# Patient Record
Sex: Female | Born: 1975 | Race: White | Hispanic: No | Smoking: Current every day smoker
Health system: Southern US, Community
[De-identification: ages and names within clinical notes are randomized; demographics above are authoritative.]

## PROBLEM LIST (undated history)

## (undated) DIAGNOSIS — R52 Pain, unspecified: Secondary | ICD-10-CM

## (undated) DIAGNOSIS — L049 Acute lymphadenitis, unspecified: Secondary | ICD-10-CM

## (undated) DIAGNOSIS — M549 Dorsalgia, unspecified: Secondary | ICD-10-CM

## (undated) DIAGNOSIS — F32A Depression, unspecified: Secondary | ICD-10-CM

## (undated) DIAGNOSIS — S129XXA Fracture of neck, unspecified, initial encounter: Secondary | ICD-10-CM

## (undated) DIAGNOSIS — N200 Calculus of kidney: Secondary | ICD-10-CM

## (undated) DIAGNOSIS — R569 Unspecified convulsions: Secondary | ICD-10-CM

## (undated) DIAGNOSIS — M51369 Other intervertebral disc degeneration, lumbar region without mention of lumbar back pain or lower extremity pain: Secondary | ICD-10-CM

## (undated) DIAGNOSIS — R112 Nausea with vomiting, unspecified: Secondary | ICD-10-CM

## (undated) DIAGNOSIS — F419 Anxiety disorder, unspecified: Secondary | ICD-10-CM

## (undated) DIAGNOSIS — M47816 Spondylosis without myelopathy or radiculopathy, lumbar region: Secondary | ICD-10-CM

## (undated) DIAGNOSIS — Z9889 Other specified postprocedural states: Secondary | ICD-10-CM

## (undated) DIAGNOSIS — G8929 Other chronic pain: Secondary | ICD-10-CM

## (undated) DIAGNOSIS — F329 Major depressive disorder, single episode, unspecified: Secondary | ICD-10-CM

## (undated) DIAGNOSIS — M5136 Other intervertebral disc degeneration, lumbar region: Secondary | ICD-10-CM

## (undated) DIAGNOSIS — N83209 Unspecified ovarian cyst, unspecified side: Secondary | ICD-10-CM

## (undated) HISTORY — DX: Unspecified ovarian cyst, unspecified side: N83.209

## (undated) HISTORY — DX: Depression, unspecified: F32.A

## (undated) HISTORY — DX: Acute lymphadenitis, unspecified: L04.9

## (undated) HISTORY — PX: COLPOSCOPY: SHX161

## (undated) HISTORY — PX: TUBAL LIGATION: SHX77

## (undated) HISTORY — PX: ABDOMINAL HYSTERECTOMY: SHX81

## (undated) HISTORY — DX: Spondylosis without myelopathy or radiculopathy, lumbar region: M47.816

## (undated) HISTORY — DX: Major depressive disorder, single episode, unspecified: F32.9

## (undated) HISTORY — PX: BACK SURGERY: SHX140

## (undated) HISTORY — DX: Anxiety disorder, unspecified: F41.9

## (undated) HISTORY — PX: TONSILLECTOMY: SUR1361

---

## 2001-05-24 ENCOUNTER — Emergency Department (HOSPITAL_COMMUNITY): Admission: EM | Admit: 2001-05-24 | Discharge: 2001-05-24 | Payer: Self-pay | Admitting: *Deleted

## 2001-11-08 ENCOUNTER — Encounter: Payer: Self-pay | Admitting: Internal Medicine

## 2001-11-08 ENCOUNTER — Ambulatory Visit (HOSPITAL_COMMUNITY): Admission: RE | Admit: 2001-11-08 | Discharge: 2001-11-08 | Payer: Self-pay | Admitting: Internal Medicine

## 2002-02-23 ENCOUNTER — Emergency Department (HOSPITAL_COMMUNITY): Admission: EM | Admit: 2002-02-23 | Discharge: 2002-02-23 | Payer: Self-pay | Admitting: Emergency Medicine

## 2002-07-10 ENCOUNTER — Encounter: Payer: Self-pay | Admitting: Internal Medicine

## 2002-07-10 ENCOUNTER — Emergency Department (HOSPITAL_COMMUNITY): Admission: EM | Admit: 2002-07-10 | Discharge: 2002-07-10 | Payer: Self-pay | Admitting: Internal Medicine

## 2002-07-13 ENCOUNTER — Ambulatory Visit (HOSPITAL_COMMUNITY): Admission: RE | Admit: 2002-07-13 | Discharge: 2002-07-13 | Payer: Self-pay | Admitting: General Surgery

## 2002-07-27 ENCOUNTER — Emergency Department (HOSPITAL_COMMUNITY): Admission: EM | Admit: 2002-07-27 | Discharge: 2002-07-27 | Payer: Self-pay | Admitting: Emergency Medicine

## 2002-08-12 ENCOUNTER — Emergency Department (HOSPITAL_COMMUNITY): Admission: EM | Admit: 2002-08-12 | Discharge: 2002-08-12 | Payer: Self-pay | Admitting: Internal Medicine

## 2002-09-29 ENCOUNTER — Encounter: Payer: Self-pay | Admitting: Emergency Medicine

## 2002-09-29 ENCOUNTER — Emergency Department (HOSPITAL_COMMUNITY): Admission: EM | Admit: 2002-09-29 | Discharge: 2002-09-29 | Payer: Self-pay | Admitting: Emergency Medicine

## 2002-10-29 ENCOUNTER — Emergency Department (HOSPITAL_COMMUNITY): Admission: EM | Admit: 2002-10-29 | Discharge: 2002-10-29 | Payer: Self-pay | Admitting: Psychology

## 2002-10-31 ENCOUNTER — Encounter: Payer: Self-pay | Admitting: Emergency Medicine

## 2002-10-31 ENCOUNTER — Emergency Department (HOSPITAL_COMMUNITY): Admission: EM | Admit: 2002-10-31 | Discharge: 2002-10-31 | Payer: Self-pay | Admitting: Emergency Medicine

## 2002-11-06 ENCOUNTER — Emergency Department (HOSPITAL_COMMUNITY): Admission: EM | Admit: 2002-11-06 | Discharge: 2002-11-06 | Payer: Self-pay | Admitting: Emergency Medicine

## 2002-11-26 ENCOUNTER — Emergency Department (HOSPITAL_COMMUNITY): Admission: EM | Admit: 2002-11-26 | Discharge: 2002-11-26 | Payer: Self-pay | Admitting: Emergency Medicine

## 2002-12-15 ENCOUNTER — Emergency Department (HOSPITAL_COMMUNITY): Admission: EM | Admit: 2002-12-15 | Discharge: 2002-12-15 | Payer: Self-pay | Admitting: Internal Medicine

## 2003-05-30 ENCOUNTER — Emergency Department (HOSPITAL_COMMUNITY): Admission: EM | Admit: 2003-05-30 | Discharge: 2003-05-31 | Payer: Self-pay | Admitting: Emergency Medicine

## 2003-05-31 ENCOUNTER — Encounter: Payer: Self-pay | Admitting: Emergency Medicine

## 2003-08-11 ENCOUNTER — Emergency Department (HOSPITAL_COMMUNITY): Admission: EM | Admit: 2003-08-11 | Discharge: 2003-08-11 | Payer: Self-pay | Admitting: Emergency Medicine

## 2004-03-04 ENCOUNTER — Ambulatory Visit (HOSPITAL_COMMUNITY): Admission: RE | Admit: 2004-03-04 | Discharge: 2004-03-04 | Payer: Self-pay | Admitting: General Surgery

## 2004-06-03 ENCOUNTER — Emergency Department (HOSPITAL_COMMUNITY): Admission: EM | Admit: 2004-06-03 | Discharge: 2004-06-03 | Payer: Self-pay | Admitting: Emergency Medicine

## 2004-12-25 ENCOUNTER — Emergency Department (HOSPITAL_COMMUNITY): Admission: EM | Admit: 2004-12-25 | Discharge: 2004-12-25 | Payer: Self-pay | Admitting: *Deleted

## 2006-06-23 ENCOUNTER — Emergency Department (HOSPITAL_COMMUNITY): Admission: EM | Admit: 2006-06-23 | Discharge: 2006-06-23 | Payer: Self-pay | Admitting: Emergency Medicine

## 2006-06-25 ENCOUNTER — Emergency Department (HOSPITAL_COMMUNITY): Admission: EM | Admit: 2006-06-25 | Discharge: 2006-06-25 | Payer: Self-pay | Admitting: Emergency Medicine

## 2006-09-28 ENCOUNTER — Inpatient Hospital Stay (HOSPITAL_COMMUNITY): Admission: AD | Admit: 2006-09-28 | Discharge: 2006-09-30 | Payer: Self-pay | Admitting: Obstetrics and Gynecology

## 2006-10-26 ENCOUNTER — Ambulatory Visit: Payer: Self-pay | Admitting: Family Medicine

## 2006-10-26 DIAGNOSIS — E669 Obesity, unspecified: Secondary | ICD-10-CM | POA: Insufficient documentation

## 2006-10-26 DIAGNOSIS — M545 Low back pain: Secondary | ICD-10-CM

## 2006-10-26 DIAGNOSIS — G43009 Migraine without aura, not intractable, without status migrainosus: Secondary | ICD-10-CM | POA: Insufficient documentation

## 2006-11-03 ENCOUNTER — Telehealth (INDEPENDENT_AMBULATORY_CARE_PROVIDER_SITE_OTHER): Payer: Self-pay | Admitting: Family Medicine

## 2006-11-09 ENCOUNTER — Encounter (INDEPENDENT_AMBULATORY_CARE_PROVIDER_SITE_OTHER): Payer: Self-pay | Admitting: Family Medicine

## 2006-11-10 ENCOUNTER — Ambulatory Visit (HOSPITAL_COMMUNITY): Admission: RE | Admit: 2006-11-10 | Discharge: 2006-11-10 | Payer: Self-pay | Admitting: Family Medicine

## 2006-11-17 ENCOUNTER — Encounter (INDEPENDENT_AMBULATORY_CARE_PROVIDER_SITE_OTHER): Payer: Self-pay | Admitting: Family Medicine

## 2006-11-23 ENCOUNTER — Ambulatory Visit: Payer: Self-pay | Admitting: Family Medicine

## 2006-11-25 DIAGNOSIS — M5137 Other intervertebral disc degeneration, lumbosacral region: Secondary | ICD-10-CM

## 2006-12-14 ENCOUNTER — Telehealth (INDEPENDENT_AMBULATORY_CARE_PROVIDER_SITE_OTHER): Payer: Self-pay | Admitting: Family Medicine

## 2006-12-14 ENCOUNTER — Emergency Department (HOSPITAL_COMMUNITY): Admission: EM | Admit: 2006-12-14 | Discharge: 2006-12-14 | Payer: Self-pay | Admitting: Emergency Medicine

## 2006-12-14 DIAGNOSIS — M542 Cervicalgia: Secondary | ICD-10-CM

## 2006-12-14 DIAGNOSIS — IMO0002 Reserved for concepts with insufficient information to code with codable children: Secondary | ICD-10-CM | POA: Insufficient documentation

## 2006-12-24 ENCOUNTER — Telehealth (INDEPENDENT_AMBULATORY_CARE_PROVIDER_SITE_OTHER): Payer: Self-pay | Admitting: *Deleted

## 2007-07-30 ENCOUNTER — Emergency Department (HOSPITAL_COMMUNITY): Admission: EM | Admit: 2007-07-30 | Discharge: 2007-07-30 | Payer: Self-pay | Admitting: Emergency Medicine

## 2007-08-13 ENCOUNTER — Ambulatory Visit (HOSPITAL_COMMUNITY): Admission: RE | Admit: 2007-08-13 | Discharge: 2007-08-13 | Payer: Self-pay | Admitting: Neurosurgery

## 2007-08-15 ENCOUNTER — Emergency Department (HOSPITAL_COMMUNITY): Admission: EM | Admit: 2007-08-15 | Discharge: 2007-08-15 | Payer: Self-pay | Admitting: Emergency Medicine

## 2007-09-24 ENCOUNTER — Encounter: Payer: Self-pay | Admitting: Internal Medicine

## 2007-09-26 ENCOUNTER — Emergency Department (HOSPITAL_COMMUNITY): Admission: EM | Admit: 2007-09-26 | Discharge: 2007-09-26 | Payer: Self-pay | Admitting: Emergency Medicine

## 2007-10-10 ENCOUNTER — Emergency Department (HOSPITAL_COMMUNITY): Admission: EM | Admit: 2007-10-10 | Discharge: 2007-10-10 | Payer: Self-pay | Admitting: Emergency Medicine

## 2007-10-17 ENCOUNTER — Emergency Department (HOSPITAL_COMMUNITY): Admission: EM | Admit: 2007-10-17 | Discharge: 2007-10-18 | Payer: Self-pay | Admitting: Emergency Medicine

## 2007-12-20 ENCOUNTER — Emergency Department (HOSPITAL_COMMUNITY): Admission: EM | Admit: 2007-12-20 | Discharge: 2007-12-20 | Payer: Self-pay | Admitting: Emergency Medicine

## 2007-12-31 ENCOUNTER — Emergency Department (HOSPITAL_COMMUNITY): Admission: EM | Admit: 2007-12-31 | Discharge: 2007-12-31 | Payer: Self-pay | Admitting: Emergency Medicine

## 2008-01-21 ENCOUNTER — Emergency Department (HOSPITAL_COMMUNITY): Admission: EM | Admit: 2008-01-21 | Discharge: 2008-01-21 | Payer: Self-pay | Admitting: Emergency Medicine

## 2008-02-20 ENCOUNTER — Emergency Department (HOSPITAL_COMMUNITY): Admission: EM | Admit: 2008-02-20 | Discharge: 2008-02-20 | Payer: Self-pay | Admitting: Emergency Medicine

## 2008-03-07 ENCOUNTER — Emergency Department (HOSPITAL_COMMUNITY): Admission: EM | Admit: 2008-03-07 | Discharge: 2008-03-07 | Payer: Self-pay | Admitting: Emergency Medicine

## 2008-04-02 ENCOUNTER — Emergency Department (HOSPITAL_COMMUNITY): Admission: EM | Admit: 2008-04-02 | Discharge: 2008-04-02 | Payer: Self-pay | Admitting: Emergency Medicine

## 2008-05-01 ENCOUNTER — Emergency Department (HOSPITAL_COMMUNITY): Admission: EM | Admit: 2008-05-01 | Discharge: 2008-05-01 | Payer: Self-pay | Admitting: Emergency Medicine

## 2008-05-20 ENCOUNTER — Emergency Department (HOSPITAL_COMMUNITY): Admission: EM | Admit: 2008-05-20 | Discharge: 2008-05-20 | Payer: Self-pay | Admitting: Emergency Medicine

## 2009-05-25 ENCOUNTER — Emergency Department (HOSPITAL_COMMUNITY): Admission: EM | Admit: 2009-05-25 | Discharge: 2009-05-25 | Payer: Self-pay | Admitting: Emergency Medicine

## 2009-08-06 ENCOUNTER — Emergency Department (HOSPITAL_COMMUNITY): Admission: EM | Admit: 2009-08-06 | Discharge: 2009-08-06 | Payer: Self-pay | Admitting: Emergency Medicine

## 2009-10-26 ENCOUNTER — Emergency Department (HOSPITAL_COMMUNITY): Admission: EM | Admit: 2009-10-26 | Discharge: 2009-10-26 | Payer: Self-pay | Admitting: Emergency Medicine

## 2010-03-10 ENCOUNTER — Emergency Department (HOSPITAL_COMMUNITY): Admission: EM | Admit: 2010-03-10 | Discharge: 2010-03-10 | Payer: Self-pay | Admitting: Emergency Medicine

## 2010-06-06 ENCOUNTER — Emergency Department (HOSPITAL_COMMUNITY): Admission: EM | Admit: 2010-06-06 | Discharge: 2010-06-06 | Payer: Self-pay | Admitting: Emergency Medicine

## 2010-06-26 ENCOUNTER — Emergency Department (HOSPITAL_COMMUNITY): Admission: EM | Admit: 2010-06-26 | Discharge: 2010-06-26 | Payer: Self-pay | Admitting: Emergency Medicine

## 2010-08-13 ENCOUNTER — Emergency Department (HOSPITAL_COMMUNITY)
Admission: EM | Admit: 2010-08-13 | Discharge: 2010-08-13 | Payer: Self-pay | Source: Home / Self Care | Admitting: Emergency Medicine

## 2010-08-27 ENCOUNTER — Emergency Department (HOSPITAL_COMMUNITY)
Admission: EM | Admit: 2010-08-27 | Discharge: 2010-08-27 | Payer: Self-pay | Source: Home / Self Care | Admitting: Emergency Medicine

## 2010-09-29 ENCOUNTER — Encounter: Payer: Self-pay | Admitting: Family Medicine

## 2010-10-11 ENCOUNTER — Emergency Department (HOSPITAL_COMMUNITY): Admit: 2010-10-11 | Discharge: 2010-10-11 | Disposition: A | Discharge status: Home / Self Care | Payer: Medicaid Other

## 2010-10-11 ENCOUNTER — Emergency Department (HOSPITAL_COMMUNITY)
Admission: EM | Admit: 2010-10-11 | Discharge: 2010-10-12 | Disposition: A | Discharge status: Home / Self Care | Payer: Medicaid Other | Attending: Emergency Medicine | Admitting: Emergency Medicine

## 2010-10-11 DIAGNOSIS — G8929 Other chronic pain: Secondary | ICD-10-CM | POA: Insufficient documentation

## 2010-10-11 DIAGNOSIS — F329 Major depressive disorder, single episode, unspecified: Secondary | ICD-10-CM | POA: Insufficient documentation

## 2010-10-11 DIAGNOSIS — F29 Unspecified psychosis not due to a substance or known physiological condition: Secondary | ICD-10-CM | POA: Insufficient documentation

## 2010-10-11 DIAGNOSIS — F3289 Other specified depressive episodes: Secondary | ICD-10-CM | POA: Insufficient documentation

## 2010-10-11 DIAGNOSIS — IMO0001 Reserved for inherently not codable concepts without codable children: Secondary | ICD-10-CM | POA: Insufficient documentation

## 2010-10-11 DIAGNOSIS — M549 Dorsalgia, unspecified: Secondary | ICD-10-CM | POA: Insufficient documentation

## 2010-10-11 DIAGNOSIS — R569 Unspecified convulsions: Secondary | ICD-10-CM | POA: Insufficient documentation

## 2010-10-11 DIAGNOSIS — R000 Tachycardia, unspecified: Secondary | ICD-10-CM | POA: Insufficient documentation

## 2010-10-11 LAB — DIFFERENTIAL
Basophils Absolute: 0 K/uL (ref 0.0–0.1)
Basophils Relative: 1 % (ref 0–1)
Eosinophils Absolute: 0.2 10*3/uL (ref 0.0–0.7)
Eosinophils Relative: 3 % (ref 0–5)
Lymphocytes Relative: 29 % (ref 12–46)
Lymphs Abs: 1.6 10*3/uL (ref 0.7–4.0)
Monocytes Absolute: 0.3 K/uL (ref 0.1–1.0)
Monocytes Relative: 6 % (ref 3–12)
Neutro Abs: 3.5 K/uL (ref 1.7–7.7)
Neutrophils Relative %: 62 % (ref 43–77)

## 2010-10-11 LAB — COMPREHENSIVE METABOLIC PANEL WITH GFR
ALT: 13 U/L (ref 0–35)
AST: 18 U/L (ref 0–37)
Albumin: 4.1 g/dL (ref 3.5–5.2)
Alkaline Phosphatase: 49 U/L (ref 39–117)
Calcium: 9.3 mg/dL (ref 8.4–10.5)
GFR calc Af Amer: >60 mL/min (ref >60)
Glucose, Bld: 85 mg/dL (ref 70–99)
Potassium: 3.9 meq/L (ref 3.5–5.1)
Sodium: 140 meq/L (ref 135–145)
Total Protein: 7.1 g/dL (ref 6.0–8.3)

## 2010-10-11 LAB — COMPREHENSIVE METABOLIC PANEL
BUN: 7 mg/dL (ref 6–23)
CO2: 27 mEq/L (ref 19–32)
Chloride: 106 mEq/L (ref 96–112)
Creatinine, Ser: 0.78 mg/dL (ref 0.4–1.2)
GFR calc non Af Amer: >60 mL/min (ref >60)
Total Bilirubin: 0.4 mg/dL (ref 0.3–1.2)

## 2010-10-11 LAB — CBC
HCT: 40.2 % (ref 36.0–46.0)
Hemoglobin: 14.2 g/dL (ref 12.0–15.0)
MCH: 31.9 pg (ref 26.0–34.0)
MCHC: 35.3 g/dL (ref 30.0–36.0)
MCV: 90.3 fL (ref 78.0–100.0)
Platelets: 245 10*3/uL (ref 150–400)
RBC: 4.45 MIL/uL (ref 3.87–5.11)
RDW: 12.9 % (ref 11.5–15.5)
WBC: 5.7 K/uL (ref 4.0–10.5)

## 2010-10-11 LAB — ETHANOL: Alcohol, Ethyl (B): <5 mg/dL (ref 0–10)

## 2010-10-11 LAB — CK: Total CK: 75 U/L (ref 7–177)

## 2010-10-12 LAB — RAPID URINE DRUG SCREEN, HOSP PERFORMED
Amphetamines: NOT DETECTED
Barbiturates: NOT DETECTED
Benzodiazepines: POSITIVE — AB
Cocaine: NOT DETECTED
Opiates: NOT DETECTED
Tetrahydrocannabinol: POSITIVE — AB

## 2010-10-30 ENCOUNTER — Inpatient Hospital Stay (HOSPITAL_COMMUNITY)
Admission: RE | Admit: 2010-10-30 | Discharge: 2010-11-01 | DRG: 093 | Disposition: A | Discharge status: Home / Self Care | Payer: Medicaid Other | Source: Ambulatory Visit | Attending: Family Medicine | Admitting: Family Medicine

## 2010-10-30 ENCOUNTER — Inpatient Hospital Stay (HOSPITAL_COMMUNITY): Payer: Medicaid Other

## 2010-10-30 ENCOUNTER — Emergency Department (HOSPITAL_COMMUNITY): Payer: Medicaid Other

## 2010-10-30 DIAGNOSIS — F411 Generalized anxiety disorder: Secondary | ICD-10-CM | POA: Diagnosis present

## 2010-10-30 DIAGNOSIS — G894 Chronic pain syndrome: Secondary | ICD-10-CM | POA: Diagnosis present

## 2010-10-30 DIAGNOSIS — R4701 Aphasia: Principal | ICD-10-CM | POA: Diagnosis present

## 2010-10-30 DIAGNOSIS — Z8249 Family history of ischemic heart disease and other diseases of the circulatory system: Secondary | ICD-10-CM

## 2010-10-30 DIAGNOSIS — Z87820 Personal history of traumatic brain injury: Secondary | ICD-10-CM

## 2010-10-30 LAB — BASIC METABOLIC PANEL
BUN: 4 mg/dL — ABNORMAL LOW (ref 6–23)
CO2: 32 mEq/L (ref 19–32)
Chloride: 104 mEq/L (ref 96–112)
Creatinine, Ser: 0.79 mg/dL (ref 0.4–1.2)
GFR calc Af Amer: >60 mL/min (ref >60)
Potassium: 3.7 mEq/L (ref 3.5–5.1)

## 2010-10-30 LAB — DIFFERENTIAL
Lymphocytes Relative: 30 % (ref 12–46)
Monocytes Absolute: 0.4 10*3/uL (ref 0.1–1.0)
Monocytes Relative: 5 % (ref 3–12)
Neutro Abs: 4 10*3/uL (ref 1.7–7.7)

## 2010-10-30 LAB — CBC
HCT: 36.2 % (ref 36.0–46.0)
Hemoglobin: 12.5 g/dL (ref 12.0–15.0)
MCH: 31.4 pg (ref 26.0–34.0)
MCHC: 34.5 g/dL (ref 30.0–36.0)

## 2010-10-30 LAB — RAPID URINE DRUG SCREEN, HOSP PERFORMED
Amphetamines: NOT DETECTED
Benzodiazepines: POSITIVE — AB
Opiates: POSITIVE — AB
Tetrahydrocannabinol: NOT DETECTED

## 2010-10-31 ENCOUNTER — Inpatient Hospital Stay (HOSPITAL_COMMUNITY): Payer: Self-pay

## 2010-10-31 ENCOUNTER — Inpatient Hospital Stay (HOSPITAL_COMMUNITY): Payer: Medicaid Other

## 2010-10-31 ENCOUNTER — Inpatient Hospital Stay (HOSPITAL_COMMUNITY): Admit: 2010-10-31 | Discharge: 2010-10-31 | Disposition: A | Discharge status: Home / Self Care | Payer: Self-pay

## 2010-10-31 LAB — DIFFERENTIAL
Basophils Absolute: 0 10*3/uL (ref 0.0–0.1)
Basophils Relative: 0 % (ref 0–1)
Eosinophils Absolute: 0.3 10*3/uL (ref 0.0–0.7)
Eosinophils Relative: 5 % (ref 0–5)
Lymphocytes Relative: 42 % (ref 12–46)

## 2010-10-31 LAB — APTT: aPTT: 27 seconds (ref 24–37)

## 2010-10-31 LAB — CBC
HCT: 33.6 % — ABNORMAL LOW (ref 36.0–46.0)
MCHC: 34.5 g/dL (ref 30.0–36.0)
Platelets: 239 10*3/uL (ref 150–400)
RDW: 12.9 % (ref 11.5–15.5)
WBC: 5 10*3/uL (ref 4.0–10.5)

## 2010-10-31 LAB — CARDIAC PANEL(CRET KIN+CKTOT+MB+TROPI)
Relative Index: INVALID (ref 0.0–2.5)
Total CK: 66 U/L (ref 7–177)
Troponin I: 0.01 ng/mL (ref 0.00–0.06)

## 2010-10-31 LAB — URINALYSIS, ROUTINE W REFLEX MICROSCOPIC
Bilirubin Urine: NEGATIVE
Nitrite: NEGATIVE
Specific Gravity, Urine: 1.005 (ref 1.005–1.030)
Urine Glucose, Fasting: NEGATIVE mg/dL
pH: 7 (ref 5.0–8.0)

## 2010-10-31 LAB — LIPID PANEL
Cholesterol: 138 mg/dL (ref 0–200)
LDL Cholesterol: 82 mg/dL (ref 0–99)

## 2010-10-31 LAB — URINE MICROSCOPIC-ADD ON

## 2010-10-31 LAB — COMPREHENSIVE METABOLIC PANEL
ALT: 14 U/L (ref 0–35)
AST: 12 U/L (ref 0–37)
Alkaline Phosphatase: 47 U/L (ref 39–117)
CO2: 28 mEq/L (ref 19–32)
Calcium: 8.6 mg/dL (ref 8.4–10.5)
Chloride: 106 mEq/L (ref 96–112)
GFR calc Af Amer: >60 mL/min (ref >60)
GFR calc non Af Amer: >60 mL/min (ref >60)
Glucose, Bld: 86 mg/dL (ref 70–99)
Potassium: 3.2 mEq/L — ABNORMAL LOW (ref 3.5–5.1)
Sodium: 142 mEq/L (ref 135–145)

## 2010-10-31 LAB — MAGNESIUM: Magnesium: 2 mg/dL (ref 1.5–2.5)

## 2010-10-31 LAB — HEMOGLOBIN A1C: Mean Plasma Glucose: 103 mg/dL (ref <117)

## 2010-11-02 NOTE — Discharge Summary (Signed)
[name], [name]                 ACCOUNT NO.:  [id number]  MEDICAL RECORD NO.:  [id number]           PATIENT TYPE:  I  LOCATION:  A205                          FACILITY:  [facility]  PHYSICIAN:  Tarry Kos, MD       DATE OF BIRTH:  [birth year]  DATE OF ADMISSION:  10/30/2010 DATE OF DISCHARGE:  02/24/2012LH                              DISCHARGE SUMMARY   DISCHARGE DIAGNOSES: 1. Expressive aphasia that is resolved and of unclear etiology. 2. Chronic pain syndrome. 3. Anxiety disorder. 4. Questionable seizure disorder history.  CONSULTATIONS:  Neurology.  HOSPITAL COURSE:  [name] is a 35 year old female who presented to the emergency room with expressive aphasia.  Please see admit H and P for full details.  She was admitted for concerns of a TIA or seizure. She had an MRI, MRA of her brain which was normal.  She had carotid Doppler ultrasound bilaterally which was negative.  She had a CT of her head which was negative.  She had EEG which was normal.  She had a hemoglobin A1c which was 5.2%.  Cholesterol panel which was normal. Urinalysis normal.  Electrolytes were normal.  LFTs normal.  BUN and creatinine normal.  Cardiac enzymes all negative.  Urine drug screen was positive for benzodiazepines and opiates.  Urine pregnancy test was negative.  Her expressive aphasia had resolved during her hospitalization.  She had a normal neurological checks and no focal neurological deficits other than difficulty in talking.  Neurology was consulted, recommended for her to follow up with him in 1 week.  He did not recommend any anticonvulsants as it was not clear if this is an actual seizure disorder or not.  The patient was requesting refill of her benzodiazepines and opiates at discharge, at which point I advised that she needs to get these from her primary care doctor with whom she gets these chronically.  The patient is being discharged home to follow up with Neurology in 1 week, also  with a primary care physician in 1-2 weeks.  She has been started on no new medications.  She is to resume her home medications with no changes.  PHYSICAL EXAMINATION:  VITAL SIGNS:  She has been afebrile.  Vital signs stable. GENERAL:  Alert and oriented x4, in no apparent stress, cooperative fairly. HEART:  Regular rate and rhythm without murmurs or gallops. CHEST:  Clear to auscultation bilaterally.  No wheeze, rhonchi, or rales. ABDOMEN:  Soft, nontender, and nondistended.  Positive bowel sounds.  No hepatosplenomegaly. EXTREMITIES:  No clubbing, cyanosis, or edema. PSYCHIATRIC:  Normal mood and affect. NEUROLOGIC:  Cranial nerves II through XII grossly intact.  No focal neurologic deficits. SKIN:  No rashes.  DISCHARGE MEDICATIONS: 1. Aspirin 325 mg daily. 2. Xanax 1 mg twice a day. 3. Vicodin 10/325 one tablet 4 times a day. 4. Again, no controlled substance prescriptions were given to the     patient at the time of discharge.  ______________________________ Tarry Kos, MD    RD/MEDQ  D:  11/01/2010  T:  11/02/2010  Job:  [id number]  Electronically Signed by Eldridge Dace MD on 11/02/2010 01:57:51 PM

## 2010-11-04 NOTE — H&P (Signed)
NAMESONAM, WANDEL                 ACCOUNT NO.:  [id number]  MEDICAL RECORD NO.:  [id number]           PATIENT TYPE:  E  LOCATION:  APED                          FACILITY:  [facility]  PHYSICIAN:  Eduard Clos, MDDATE OF BIRTH:  1976-04-17  DATE OF ADMISSION:  10/30/2010 DATE OF DISCHARGE:  LH                             HISTORY & PHYSICAL   PRIMARY CARE PHYSICIAN:  Catalina Pizza, MD  CHIEF COMPLAINT:  Difficulty talking.  HISTORY OF PRESENT ILLNESS:  A 35 year old female with a history of chronic back pain who had a seizure 2 weeks ago, grand mal, and had come to the ER at that time and was referred to neurologist.  The patient was not able to get the appointment, had some difficulty speaking today around 3:30 p.m. which lasted for half an hour.  The patient came to the ER.  By then, her complete symptoms have resolved.  The patient did not have any difficulty swallowing or any focal deficit.  Did not have any headache or seizures today.  Did not have any chest pain, shortness of breath, palpitations, nausea, vomiting, abdominal pain, dysuria, discharge, or diarrhea.  Did not lose consciousness or have a headache.  In the ER, the patient had discussion with the on-call neurologist who advised MRI of the brain.  At this time, the patient is admitted for further workup.  PAST MEDICAL HISTORY: 1. Anxiety. 2. Chronic pain.  PAST SURGICAL HISTORY:  Tonsillectomy.  MEDICATIONS UPON ADMISSION:  The patient takes Vicodin dose, not clearly known at this time, Xanax 1 mg p.o. twice daily.  ALLERGIES:  DARVOCET and PENICILLIN.  FAMILY HISTORY:  Significant for coronary disease and father dying at age 46s from MI.  Father also had seizures.  SOCIAL HISTORY:  The patient quit smoking 6 years ago.  Denies any alcohol or drug abuse.  Married, lives with her husband.  REVIEW OF SYSTEMS:  As per history of present illness, nothing else significant.  PHYSICAL EXAMINATION:  GENERAL:   The patient examined at bedside, not in acute distress. VITAL SIGNS:  Blood pressure is 132/90, pulse 106 per minute, temperature 98.1, respirations 18, O2 sat 100%. HEENT:  Anicteric.  No pallor.  No facial asymmetry.  Tongue is midline. No neck rigidity.  No discharge from ears, eyes, nose, or mouth. CHEST:  Bilateral air entry present.  Clear to auscultation. HEART:  S1 and S2 heard. ABDOMEN:  Soft, nontender.  Bowel sounds heard. CNS:  Alert, awake, oriented to time, place, and person.  Moves upper and lower extremities, 5/5.  There is no pronator drift.  No ataxia or dysdiadochokinesia. EXTREMITIES:  Peripheral pulses felt.  No edema.  LABORATORY DATA:  EKGs have been ordered.  A CT head has been ordered. CT head on October 15, 2010, did not show anything acute.  CBC, WBC 6.6, hemoglobin is 12.4, hematocrit 36.2, platelets 243,000.  Basic metabolic panel, sodium 142, potassium 3.5, chloride 4, carbon dioxide 32, glucose 109, BUN 4, creatinine 0.7, calcium 8.9.  Pregnancy screen negative.  UA showing possible for benzo and opiates.  ASSESSMENT: 1. Expresses aphasia to rule  out transient ischemic attack. 2. Recent grand mal seizure. 3. Chronic pain. 4. Anxiety disorder.  PLAN: 1. At this time, we will admit the patient to telemetry. 2. For her expressive aphasia with a possibility of TIA, the patient     will be placed on neuro checks.  We will get a CT head.  The     patient is not a candidate for any TPA as her symptoms have     completely resolved.  We will get MRI of the brain, MRA of the     brain, 2-D echo, carotid Doppler. 3. For possible seizures, we are going to get EEG. 4. Further recommendation based on the clinic course.     Eduard Clos, MD     ANK/MEDQ  D:  10/30/2010  T:  10/30/2010  Job:  [id number]  cc:   Catalina Pizza, M.D. Fax: [phone]  Electronically Signed by Midge Minium MD on 11/04/2010 05:05:40 PM
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
## 2010-11-15 ENCOUNTER — Emergency Department (HOSPITAL_COMMUNITY)
Admission: EM | Admit: 2010-11-15 | Discharge: 2010-11-15 | Disposition: A | Discharge status: Home / Self Care | Payer: Medicaid Other | Attending: Emergency Medicine | Admitting: Emergency Medicine

## 2010-11-15 ENCOUNTER — Emergency Department (HOSPITAL_COMMUNITY): Payer: Medicaid Other

## 2010-11-15 DIAGNOSIS — S335XXA Sprain of ligaments of lumbar spine, initial encounter: Secondary | ICD-10-CM | POA: Insufficient documentation

## 2010-11-15 DIAGNOSIS — S20229A Contusion of unspecified back wall of thorax, initial encounter: Secondary | ICD-10-CM | POA: Insufficient documentation

## 2010-11-15 DIAGNOSIS — Y93E1 Activity, personal bathing and showering: Secondary | ICD-10-CM | POA: Insufficient documentation

## 2010-11-15 DIAGNOSIS — F3289 Other specified depressive episodes: Secondary | ICD-10-CM | POA: Insufficient documentation

## 2010-11-15 DIAGNOSIS — W010XXA Fall on same level from slipping, tripping and stumbling without subsequent striking against object, initial encounter: Secondary | ICD-10-CM | POA: Insufficient documentation

## 2010-11-15 DIAGNOSIS — G40909 Epilepsy, unspecified, not intractable, without status epilepticus: Secondary | ICD-10-CM | POA: Insufficient documentation

## 2010-11-15 DIAGNOSIS — Z79899 Other long term (current) drug therapy: Secondary | ICD-10-CM | POA: Insufficient documentation

## 2010-11-15 DIAGNOSIS — F329 Major depressive disorder, single episode, unspecified: Secondary | ICD-10-CM | POA: Insufficient documentation

## 2010-11-15 DIAGNOSIS — M545 Low back pain, unspecified: Secondary | ICD-10-CM | POA: Insufficient documentation

## 2010-11-15 DIAGNOSIS — IMO0002 Reserved for concepts with insufficient information to code with codable children: Secondary | ICD-10-CM | POA: Insufficient documentation

## 2010-11-15 DIAGNOSIS — Y92009 Unspecified place in unspecified non-institutional (private) residence as the place of occurrence of the external cause: Secondary | ICD-10-CM | POA: Insufficient documentation

## 2010-11-15 LAB — POCT PREGNANCY, URINE: Preg Test, Ur: NEGATIVE

## 2010-11-18 LAB — URINE CULTURE
Colony Count: NO GROWTH
Culture  Setup Time: 201112061941
Culture: NO GROWTH

## 2010-11-18 LAB — CBC
HCT: 37.5 % (ref 36.0–46.0)
MCH: 31 pg (ref 26.0–34.0)
MCHC: 35.5 g/dL (ref 30.0–36.0)
MCV: 87.4 fL (ref 78.0–100.0)
RDW: 13.4 % (ref 11.5–15.5)
WBC: 8 10*3/uL (ref 4.0–10.5)

## 2010-11-18 LAB — BASIC METABOLIC PANEL
BUN: 4 mg/dL — ABNORMAL LOW (ref 6–23)
CO2: 23 mEq/L (ref 19–32)
Calcium: 9 mg/dL (ref 8.4–10.5)
Chloride: 109 mEq/L (ref 96–112)
Creatinine, Ser: 0.56 mg/dL (ref 0.4–1.2)
GFR calc Af Amer: >60 mL/min (ref >60)
GFR calc non Af Amer: >60 mL/min (ref >60)
Glucose, Bld: 106 mg/dL — ABNORMAL HIGH (ref 70–99)
Potassium: 3.9 mEq/L (ref 3.5–5.1)
Sodium: 139 mEq/L (ref 135–145)

## 2010-11-18 LAB — URINE MICROSCOPIC-ADD ON

## 2010-11-18 LAB — URINALYSIS, ROUTINE W REFLEX MICROSCOPIC
Bilirubin Urine: NEGATIVE
Glucose, UA: NEGATIVE mg/dL
Ketones, ur: NEGATIVE mg/dL
Nitrite: NEGATIVE
Protein, ur: NEGATIVE mg/dL
Specific Gravity, Urine: 1.01 (ref 1.005–1.030)
Urobilinogen, UA: 0.2 mg/dL (ref 0.0–1.0)
pH: 7.5 (ref 5.0–8.0)

## 2010-11-18 LAB — DIFFERENTIAL
Basophils Absolute: 0 10*3/uL (ref 0.0–0.1)
Eosinophils Relative: 1 % (ref 0–5)
Lymphocytes Relative: 19 % (ref 12–46)
Monocytes Absolute: 0.3 10*3/uL (ref 0.1–1.0)

## 2010-11-21 LAB — URINALYSIS, ROUTINE W REFLEX MICROSCOPIC
Glucose, UA: NEGATIVE mg/dL
Protein, ur: NEGATIVE mg/dL
Urobilinogen, UA: 0.2 mg/dL (ref 0.0–1.0)

## 2010-11-21 LAB — BASIC METABOLIC PANEL
BUN: 11 mg/dL (ref 6–23)
CO2: 28 mEq/L (ref 19–32)
Chloride: 97 mEq/L (ref 96–112)
Creatinine, Ser: 0.55 mg/dL (ref 0.4–1.2)
Glucose, Bld: 83 mg/dL (ref 70–99)

## 2010-11-21 LAB — URINE MICROSCOPIC-ADD ON

## 2010-11-27 LAB — BASIC METABOLIC PANEL
Calcium: 9.1 mg/dL (ref 8.4–10.5)
GFR calc Af Amer: >60 mL/min (ref >60)
GFR calc non Af Amer: >60 mL/min (ref >60)
Glucose, Bld: 114 mg/dL — ABNORMAL HIGH (ref 70–99)
Sodium: 138 mEq/L (ref 135–145)

## 2010-11-27 LAB — URINE CULTURE: Culture: NO GROWTH

## 2010-11-27 LAB — URINE MICROSCOPIC-ADD ON

## 2010-11-27 LAB — CBC
Hemoglobin: 14.5 g/dL (ref 12.0–15.0)
RBC: 4.39 MIL/uL (ref 3.87–5.11)
RDW: 14.5 % (ref 11.5–15.5)

## 2010-11-27 LAB — DIFFERENTIAL
Eosinophils Relative: 1 % (ref 0–5)
Lymphocytes Relative: 8 % — ABNORMAL LOW (ref 12–46)
Lymphs Abs: 0.8 10*3/uL (ref 0.7–4.0)
Monocytes Absolute: 0.3 10*3/uL (ref 0.1–1.0)
Monocytes Relative: 4 % (ref 3–12)

## 2010-11-27 LAB — URINALYSIS, ROUTINE W REFLEX MICROSCOPIC
Bilirubin Urine: NEGATIVE
Specific Gravity, Urine: >1.03 — ABNORMAL HIGH (ref 1.005–1.030)
pH: 5 (ref 5.0–8.0)

## 2010-12-01 ENCOUNTER — Emergency Department (HOSPITAL_COMMUNITY): Payer: Medicaid Other

## 2010-12-01 ENCOUNTER — Emergency Department (HOSPITAL_COMMUNITY)
Admission: EM | Admit: 2010-12-01 | Discharge: 2010-12-02 | Disposition: A | Discharge status: Home / Self Care | Payer: Medicaid Other | Attending: Emergency Medicine | Admitting: Emergency Medicine

## 2010-12-01 DIAGNOSIS — M549 Dorsalgia, unspecified: Secondary | ICD-10-CM | POA: Insufficient documentation

## 2010-12-01 DIAGNOSIS — R109 Unspecified abdominal pain: Secondary | ICD-10-CM | POA: Insufficient documentation

## 2010-12-01 DIAGNOSIS — N12 Tubulo-interstitial nephritis, not specified as acute or chronic: Secondary | ICD-10-CM | POA: Insufficient documentation

## 2010-12-01 LAB — DIFFERENTIAL
Basophils Absolute: 0 10*3/uL (ref 0.0–0.1)
Eosinophils Relative: 1 % (ref 0–5)
Lymphocytes Relative: 14 % (ref 12–46)
Neutro Abs: 7.5 10*3/uL (ref 1.7–7.7)
Neutrophils Relative %: 79 % — ABNORMAL HIGH (ref 43–77)

## 2010-12-01 LAB — URINALYSIS, ROUTINE W REFLEX MICROSCOPIC
Bilirubin Urine: NEGATIVE
Specific Gravity, Urine: 1.02 (ref 1.005–1.030)
pH: 5.5 (ref 5.0–8.0)

## 2010-12-01 LAB — COMPREHENSIVE METABOLIC PANEL
ALT: 9 U/L (ref 0–35)
AST: 14 U/L (ref 0–37)
CO2: 23 mEq/L (ref 19–32)
Calcium: 9.3 mg/dL (ref 8.4–10.5)
GFR calc Af Amer: >60 mL/min (ref >60)
Sodium: 138 mEq/L (ref 135–145)
Total Protein: 7.3 g/dL (ref 6.0–8.3)

## 2010-12-01 LAB — CBC
HCT: 40.4 % (ref 36.0–46.0)
RBC: 4.44 MIL/uL (ref 3.87–5.11)
RDW: 13 % (ref 11.5–15.5)
WBC: 9.5 10*3/uL (ref 4.0–10.5)

## 2010-12-01 LAB — URINE MICROSCOPIC-ADD ON

## 2010-12-01 MED ORDER — IOHEXOL 300 MG/ML  SOLN
100.0000 mL | Freq: Once | INTRAMUSCULAR | Status: AC | PRN
  Administered 2010-12-01: 100 mL via INTRAVENOUS

## 2010-12-03 LAB — URINE CULTURE

## 2010-12-05 ENCOUNTER — Emergency Department (HOSPITAL_COMMUNITY)
Admission: EM | Admit: 2010-12-05 | Discharge: 2010-12-05 | Disposition: A | Discharge status: Home / Self Care | Payer: Medicaid Other | Attending: Emergency Medicine | Admitting: Emergency Medicine

## 2010-12-05 DIAGNOSIS — N12 Tubulo-interstitial nephritis, not specified as acute or chronic: Secondary | ICD-10-CM | POA: Insufficient documentation

## 2010-12-05 LAB — DIFFERENTIAL
Basophils Absolute: 0 10*3/uL (ref 0.0–0.1)
Eosinophils Absolute: 0.1 10*3/uL (ref 0.0–0.7)
Lymphocytes Relative: 12 % (ref 12–46)
Lymphs Abs: 0.6 10*3/uL — ABNORMAL LOW (ref 0.7–4.0)
Neutrophils Relative %: 76 % (ref 43–77)

## 2010-12-05 LAB — URINALYSIS, ROUTINE W REFLEX MICROSCOPIC
Bilirubin Urine: NEGATIVE
Nitrite: NEGATIVE
Specific Gravity, Urine: 1.01 (ref 1.005–1.030)
pH: 6 (ref 5.0–8.0)

## 2010-12-05 LAB — COMPREHENSIVE METABOLIC PANEL
ALT: 8 U/L (ref 0–35)
AST: 12 U/L (ref 0–37)
Albumin: 3.6 g/dL (ref 3.5–5.2)
Alkaline Phosphatase: 50 U/L (ref 39–117)
BUN: 9 mg/dL (ref 6–23)
Chloride: 104 mEq/L (ref 96–112)
Potassium: 3.6 mEq/L (ref 3.5–5.1)
Sodium: 138 mEq/L (ref 135–145)
Total Bilirubin: 0.5 mg/dL (ref 0.3–1.2)

## 2010-12-05 LAB — CBC
MCV: 90.5 fL (ref 78.0–100.0)
Platelets: 233 10*3/uL (ref 150–400)
RBC: 4.21 MIL/uL (ref 3.87–5.11)
WBC: 5.3 10*3/uL (ref 4.0–10.5)

## 2010-12-05 LAB — URINE MICROSCOPIC-ADD ON

## 2010-12-11 LAB — URINALYSIS, ROUTINE W REFLEX MICROSCOPIC
Glucose, UA: NEGATIVE mg/dL
Ketones, ur: NEGATIVE mg/dL
Specific Gravity, Urine: >1.03 — ABNORMAL HIGH (ref 1.005–1.030)
pH: 5.5 (ref 5.0–8.0)

## 2010-12-11 LAB — URINE MICROSCOPIC-ADD ON

## 2010-12-13 LAB — BASIC METABOLIC PANEL
BUN: 4 mg/dL — ABNORMAL LOW (ref 6–23)
GFR calc Af Amer: >60 mL/min (ref >60)
GFR calc non Af Amer: >60 mL/min (ref >60)
Potassium: 3.7 mEq/L (ref 3.5–5.1)

## 2010-12-13 LAB — CBC
HCT: 41.3 % (ref 36.0–46.0)
Platelets: 252 10*3/uL (ref 150–400)
RBC: 4.45 MIL/uL (ref 3.87–5.11)
WBC: 7.1 10*3/uL (ref 4.0–10.5)

## 2010-12-13 LAB — URINE MICROSCOPIC-ADD ON

## 2010-12-13 LAB — DIFFERENTIAL
Basophils Absolute: 0 10*3/uL (ref 0.0–0.1)
Lymphocytes Relative: 21 % (ref 12–46)
Monocytes Absolute: 0.3 10*3/uL (ref 0.1–1.0)
Monocytes Relative: 4 % (ref 3–12)
Neutro Abs: 5.3 10*3/uL (ref 1.7–7.7)

## 2010-12-13 LAB — URINALYSIS, ROUTINE W REFLEX MICROSCOPIC
Bilirubin Urine: NEGATIVE
Glucose, UA: NEGATIVE mg/dL
Nitrite: NEGATIVE

## 2011-01-07 ENCOUNTER — Ambulatory Visit (HOSPITAL_COMMUNITY): Payer: Self-pay

## 2011-01-07 ENCOUNTER — Other Ambulatory Visit: Payer: Self-pay | Admitting: Neurology

## 2011-01-07 DIAGNOSIS — M545 Low back pain, unspecified: Secondary | ICD-10-CM

## 2011-01-21 NOTE — Op Note (Signed)
NAME:  [name], [name]                 ACCOUNT NO.:  [id number]   MEDICAL RECORD NO.:  [id number]          PATIENT TYPE:  OIB   LOCATION:  3526                         FACILITY:  [facility]   PHYSICIAN:  Reinaldo Meeker, M.D. 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   PROCEDURE:  Right L3-4 interlaminar laminotomy for excision of herniated  disk with operative microscope.   SECONDARY PROCEDURE:  Microsection of L3-4 disk and L4 nerve root.   SURGEON:  Reinaldo Meeker, M.D.   ASSISTANT:  Donalee Citrin, M.D.   PROCEDURE IN DETAIL:  After placed in the prone position the patient's  back was shaved, prepped and draped in the usual sterile fashion.  Localizing x-rays taken prior to incision to identify the appropriate  level.  Midline incision was made over the spinous processes of L3 and  L4.  Using Bovie cutting current the incision was carried down the  spinous processes.  Subperiosteal dissection was then carried out on the  right side of the spinous processes and lamina and self-retaining  retractor was placed for exposure.  X-rays showed approach to L2-3.  Dissection was carried out one level further until L3-4 had been  exposed.  High-speed drill was then used to remove the inferior one-half  of the L3 lamina, medial one-third of facet joint, the superior one-  third of L4 lamina.  Residual bone and ligamentum flavum removed in a  piecemeal fashion.  The microscope was draped, brought in the field used  and in the case.  Using microsection technique the lateral aspect of  thecal sac and L4 nerve were identified.  Further coagulation was  carried down to the floor canal to identify the L3-4 disk which was  found to be herniated centrally.  After coagulating on the  annulus it  was incised with 15 blade.  Using pituitary rongeur and curettes, the  disk space clean-out was carried out with great care taken to generously  clean out the midline.  At this time, inspection was carried out in all  directions for any evidence of residual compression and none could be  identified.  Large amounts of irrigation were carried out.  Any bleeding  was controlled with bipolar coagulation and Gelfoam.  The wound was then  closed in multiple layers of Vicryl in the muscle fascia, subcutaneous  tissues and staples were placed on the skin.  A sterile dressing was  then applied.  The patient was extubated and taken to the recovery room  in stable condition.           ______________________________  Reinaldo Meeker, M.D.     ROK/MEDQ  D:  08/13/2007  T:  08/13/2007  Job:  [id number]

## 2011-01-24 NOTE — Op Note (Signed)
[name], [name]                 ACCOUNT NO.:  [id number]   MEDICAL RECORD NO.:  [id number]          PATIENT TYPE:  INP   LOCATION:  LDR1                          FACILITY:  [facility]   PHYSICIAN:  Tilda Burrow, M.D. DATE OF BIRTH:  03-27-1976   DATE OF PROCEDURE:  09/29/2006  DATE OF DISCHARGE:                               OPERATIVE REPORT   DATE OF ONSET OF LABOR:  September 29, 2006 at 8 a.m.   DATE OF DELIVERY:  September 29, 2006 at 1315 hours.   LENGTH OF FIRST STAGE OF LABOR:  Four hours and 52 minutes.   LENGTH OF SECOND STAGE OF LABOR:  Twenty-three minutes.   LENGTH OF THIRD STAGE OF LABOR:  Six minutes.   DELIVERY NOTE:  [name] had a normal spontaneous delivery of a viable  female infant.  Upon delivery of head there was a nuchal cord and an  occult arm, which the occult arm was delivered with posterior shoulder  delivering first and cord loosened and infant slipped through.  Upon  delivery nose and mouth were thoroughly suctioned, infant dried, cord  clamped and cut, and to mother's abdomen for newborn care.  Apgars were  9 and 9.  Third stage of labor was actively managed with 20 units of  Pitocin and 1000 mL of D5 LR at a rapid rate.  Placenta was delivered  spontaneously via Schultze's mechanism.  A three-vessel cord is noted  upon inspection.  Membranes are noted to be intact upon inspection.  Cord blood and cord blood gas was obtained.  The perineum is noted to be  intact upon inspection.  Estimated blood loss was approximately 300 mL.  Epidural catheter was removed with blue tip intact.  Infant and mother  stabilized and transferred out to the postpartum unit in stable  condition.   ADDENDUM:  It is noted that the infant's right foot has a compression  deformity.  Dr. Michae Kava will be notified in regards to that.      Zerita Boers, Lanier Clam      Tilda Burrow, M.D.  Electronically Signed    DL/MEDQ  D:  16/06/9603  T:  09/29/2006  Job:  [id number]   cc:    Douglass Rivers, M.D.  Fax: [phone]   [facility]
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
## 2011-01-24 NOTE — Op Note (Signed)
NAME:  [name], [name] NO.:  [id number]   MEDICAL RECORD NO.:  [id number]                   PATIENT TYPE:  EMS   LOCATION:  ED                                   FACILITY:  [facility]   PHYSICIAN:  Barbaraann Barthel, M.D.              DATE OF BIRTH:  [birth year]   DATE OF PROCEDURE:  07/13/2002  DATE OF DISCHARGE:                                 OPERATIVE REPORT   PREOPERATIVE DIAGNOSIS:  Left supraclavicular node enlargement.   PROCEDURE:  Excisional biopsy of deep left supraclavicular node.   SURGEON:  Barbaraann Barthel, M.D.   SPECIMENS:  Left supraclavicular node.   CLINICAL NOTE:  This is a 35 year old white female who was referred from Dr.  Sharyon Medicus office with a tender, enlarged lymph node in the supraclavicular  area that he had been following as an outpatient.  He had treated this  patient with antibiotics without resolution and with actual increase in the  size of the node noted by the patient.  This had been present for at least  two weeks' time and likely longer but two weeks before it became  symptomatic.  The patient had a normal chest x-ray, no past history of  fever, chills, rashes, fatigue, or weight loss, or any systemic symptoms  associated with this.  CBC was also grossly within normal limits.  The  patient does have a cat.  She has had no past history of cat scratch  disease.   OPERATIVE FINDINGS:  There appeared to be two lymph nodes very matted  together in the left supraclavicular area between the scalene muscles.  The  most superficial of the nodes was very crumbly.  No other abnormalities were  noted in this area other than that.  Frozen section favored a granulomatous  process.   DESCRIPTION OF PROCEDURE:  The patient was placed in the supine position  with her face turned to the right.  Her supraclavicular area was prepped  with Betadine solution and draped in the usual manner.  A transverse  incision was carried out over the  palpable mass which was delineated with a  sterile marking pan.  Skin, subcutaneous tissue, and cervical fascia were  incised.  The scalene muscles were retracted with careful dissection around  the neurovascular bundles.  The lymph node was excised, ligating its pedicle  with 2-0 silk and with small clips as needed.  This was sent for frozen  section to pathology as instructed in a moistened saline gauze as a fresh  specimen.  The wound was then irrigated and the subcutaneous layer and  fascia were closed with a 2-0 Polysurb, and the skin  was approximated with 5-0 Polysorb sutures.  Steri-Strips and Neosporin and  a sterile dressing were applied.  No drains were placed.  Prior to closure,  all sponge, needle, and instrument counts were found to be correct.  Estimated blood  loss was minimal.  The patient received 1200 cc of  crystalloid intraoperatively.  There were no complications.                                                 Barbaraann Barthel, M.D.    WB/MEDQ  D:  07/13/2002  T:  07/14/2002  Job:  [id number]   cc:   Madelin Rear. Sherwood Gambler, M.D.  [address]  [place]  [place] [address]  Fax: [phone]
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
## 2011-01-24 NOTE — Op Note (Signed)
NAME:  [name], PARK                 ACCOUNT NO.:  [id number]   MEDICAL RECORD NO.:  [id number]          PATIENT TYPE:  INP   LOCATION:  LDR1                          FACILITY:  [facility]   PHYSICIAN:  Lazaro Arms, M.D.   DATE OF BIRTH:  [birth year]   DATE OF PROCEDURE:  09/29/2006  DATE OF DISCHARGE:                               OPERATIVE REPORT   PROCEDURE PERFORMED:  Epidural catheter placement.   [name] is a 35 year old white female gravida 1, para 0, who was admitted  last night for induction of labor.  She is now by Darlene's exam 3, 70 -  1.  She is having regular uterine contractions and requesting an  epidural.   The patient was placed in sitting position.  The L3-L4 space was  identified.  Her back was prepped and draped in the usual sterile  fashion.  1% lidocaine was injected in the L3-L4 interspace just off to  the left of the midline, some a bit to the right and found the epidural  space.  We used a 17 gauge Tuohy needle and a loss of resistance  technique.  However, I was basically having to push the skin in and had  the needle at the hub and I could not get the catheter to feed at this  point.  I gave a test dose and the patient had no ill effects and she  was feeling positive changes, but I do not think it was quite enough to  get into the epidural space.  As a result, I moved up one space to the  L2-L3 interspace.  1% lidocaine was injected there.  One try found the  epidural space without difficulty.  It was about 0.5 cm away from the  skin and at this pace, the catheter fed easily into the epidural space.  An additional 10 mL of 0.125% bupivacaine plain was given and a  continuous infusion of 0.125% bupivacaine with 2 mcg per mL of fentanyl  was begun at 12 mL an hour.  The patient tolerated the procedure well.  It was taped down 5 cm from the epidural space and a continuous infusion  of 0.125% bupivacaine with 2 mcg per mL of fentanyl was begun at 12 mL  an  hour.      Lazaro Arms, M.D.  Electronically Signed     LHE/MEDQ  D:  09/29/2006  T:  09/29/2006  Job:  [id number]

## 2011-02-06 ENCOUNTER — Ambulatory Visit (HOSPITAL_COMMUNITY)
Admission: RE | Admit: 2011-02-06 | Discharge: 2011-02-06 | Disposition: A | Discharge status: Home / Self Care | Payer: Medicaid Other | Source: Ambulatory Visit | Attending: Neurology | Admitting: Neurology

## 2011-02-06 ENCOUNTER — Other Ambulatory Visit: Payer: Self-pay | Admitting: Neurology

## 2011-02-06 DIAGNOSIS — M545 Low back pain, unspecified: Secondary | ICD-10-CM | POA: Insufficient documentation

## 2011-02-06 DIAGNOSIS — M25559 Pain in unspecified hip: Secondary | ICD-10-CM | POA: Insufficient documentation

## 2011-03-31 ENCOUNTER — Encounter: Payer: Self-pay | Admitting: Emergency Medicine

## 2011-03-31 ENCOUNTER — Emergency Department (HOSPITAL_COMMUNITY)
Admission: EM | Admit: 2011-03-31 | Discharge: 2011-03-31 | Disposition: A | Discharge status: Home / Self Care | Payer: Medicaid Other | Attending: Emergency Medicine | Admitting: Emergency Medicine

## 2011-03-31 ENCOUNTER — Emergency Department (HOSPITAL_COMMUNITY): Payer: Medicaid Other

## 2011-03-31 DIAGNOSIS — S93409A Sprain of unspecified ligament of unspecified ankle, initial encounter: Secondary | ICD-10-CM

## 2011-03-31 DIAGNOSIS — X500XXA Overexertion from strenuous movement or load, initial encounter: Secondary | ICD-10-CM | POA: Insufficient documentation

## 2011-03-31 DIAGNOSIS — Y92009 Unspecified place in unspecified non-institutional (private) residence as the place of occurrence of the external cause: Secondary | ICD-10-CM | POA: Insufficient documentation

## 2011-03-31 MED ORDER — OXYCODONE-ACETAMINOPHEN 5-325 MG PO TABS
1.0000 | ORAL_TABLET | Freq: Once | ORAL | Status: AC
  Administered 2011-03-31: 1 via ORAL
  Filled 2011-03-31: qty 1

## 2011-03-31 MED ORDER — HYDROCODONE-ACETAMINOPHEN 5-325 MG PO TABS: ORAL_TABLET | ORAL | Status: AC

## 2011-03-31 NOTE — ED Notes (Signed)
Pt has crutches at home,  
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
## 2011-03-31 NOTE — ED Notes (Signed)
Pt fell July 4th and c/o rt ankle pain. Pt foot is swollen and has had xrays done but were negative.

## 2011-03-31 NOTE — ED Provider Notes (Signed)
History     Chief Complaint  Patient presents with  . Fall  . Ankle Pain   HPI Comments: Patient c/o persist pain , bruising and swelling to the right ankle.  States she fell on July 4 and twisted the ankle.  Was seen initially at another facility and told it was a sprain.  Continues to have sx's.  She denies other injuries, numbness or weakness  Patient is a 35 y.o. female presenting with ankle pain. The history is provided by the patient.  Ankle Pain  The incident occurred more than 1 week ago. The incident occurred at home. The injury mechanism was torsion. The pain is present in the right ankle. The pain is moderate. The pain has been constant since onset. Associated symptoms include inability to bear weight and loss of motion. Pertinent negatives include no numbness, no muscle weakness, no loss of sensation and no tingling. She reports no foreign bodies present. The symptoms are aggravated by activity, bearing weight and palpation. She has tried NSAIDs for the symptoms. The treatment provided no relief.    History reviewed. No pertinent past medical history.  Past Surgical History  Procedure Date  . Back surgery     History reviewed. No pertinent family history.  History  Substance Use Topics  . Smoking status: Never Smoker   . Smokeless tobacco: Not on file  . Alcohol Use: No    OB History    Grav Para Term Preterm Abortions TAB SAB Ect Mult Living                  Review of Systems  Constitutional: Negative for fever.  HENT: Negative for neck pain and neck stiffness.   Respiratory: Negative for chest tightness, shortness of breath and wheezing.   Cardiovascular: Negative.   Gastrointestinal: Negative for abdominal pain.  Musculoskeletal: Positive for arthralgias. Negative for myalgias and joint swelling.  Skin: Negative.   Neurological: Negative for dizziness, tingling, facial asymmetry, speech difficulty, weakness and numbness.  Hematological: Does not  bruise/bleed easily.  Psychiatric/Behavioral: Negative for behavioral problems.    Physical Exam  BP 121/88  Pulse 83  Temp(Src) 97.9 F (36.6 C) (Oral)  Resp 20  Ht 5' 5.5" (1.664 m)  Wt 170 lb (77.111 kg)  BMI 27.86 kg/m2  SpO2 100%  LMP 03/23/2011  Physical Exam  Nursing note and vitals reviewed. Constitutional: She appears well-developed and well-nourished. No distress.  HENT:  Head: Normocephalic and atraumatic.  Neck: Normal range of motion. Neck supple.  Cardiovascular: Normal rate, regular rhythm and normal heart sounds.   Pulmonary/Chest: Effort normal and breath sounds normal. No respiratory distress. She exhibits no tenderness.  Musculoskeletal: She exhibits tenderness. She exhibits no edema.       Right ankle: She exhibits normal range of motion, no swelling and normal pulse. tenderness. Lateral malleolus tenderness found. Achilles tendon normal.  Lymphadenopathy:    She has no cervical adenopathy.  Neurological: She is alert. She exhibits normal muscle tone. Coordination normal.  Skin: Skin is warm and dry.    ED Course  Procedures  MDM    1225  Moderate STS and bruising present to lateral right ankle and dorsal right foot.  Distal sensation intact,  DP pulse is strong.  CR<2 sec.  Patient has crutches at home.  She agrees to f/u with ortho.  Placed her in a right ASO splint by the nursing staff, NV intact, pt tolerated well  Dg Ankle Complete Right  03/31/2011  *RADIOLOGY  REPORT*  Clinical Data: Fall, pain.  RIGHT ANKLE - COMPLETE 3+ VIEW  Comparison: None.  Findings: Lateral soft tissue swelling.  No underlying bony abnormality.  No fracture, subluxation or dislocation.  IMPRESSION: No bony abnormality.  Original Report Authenticated By: Cyndie Chime, M.D.        Sirenia Whitis L. [place], [place] 04/06/11 2128

## 2011-04-04 NOTE — ED Provider Notes (Signed)
History     Chief Complaint  Patient presents with  . Fall  . Ankle Pain   HPI  History reviewed. No pertinent past medical history.  Past Surgical History  Procedure Date  . Back surgery     History reviewed. No pertinent family history.  History  Substance Use Topics  . Smoking status: Never Smoker   . Smokeless tobacco: Not on file  . Alcohol Use: No    OB History    Grav Para Term Preterm Abortions TAB SAB Ect Mult Living                  Review of Systems  Physical Exam  BP 121/88  Pulse 83  Temp(Src) 97.9 F (36.6 C) (Oral)  Resp 20  Ht 5' 5.5" (1.664 m)  Wt 170 lb (77.111 kg)  BMI 27.86 kg/m2  SpO2 100%  LMP 03/23/2011  Physical Exam  ED Course  Procedures  MDM Medical screening examination/treatment/procedure(s) were performed by non-physician practitioner and as supervising physician I was immediately available for consultation/collaboration.       Benny Lennert, MD 04/04/11 [phone]

## 2011-04-21 NOTE — ED Provider Notes (Signed)
Medical screening examination/treatment/procedure(s) were performed by non-physician practitioner and as supervising physician I was immediately available for consultation/collaboration.   Nyemah Watton L Erminia Mcnew, MD 04/21/11 0137 

## 2011-05-19 ENCOUNTER — Emergency Department (HOSPITAL_COMMUNITY): Payer: Medicaid Other

## 2011-05-19 ENCOUNTER — Encounter (HOSPITAL_COMMUNITY): Payer: Self-pay | Admitting: *Deleted

## 2011-05-19 ENCOUNTER — Emergency Department (HOSPITAL_COMMUNITY)
Admission: EM | Admit: 2011-05-19 | Discharge: 2011-05-19 | Disposition: A | Discharge status: Home / Self Care | Payer: Medicaid Other | Attending: Emergency Medicine | Admitting: Emergency Medicine

## 2011-05-19 DIAGNOSIS — R51 Headache: Secondary | ICD-10-CM | POA: Insufficient documentation

## 2011-05-19 DIAGNOSIS — R569 Unspecified convulsions: Secondary | ICD-10-CM | POA: Insufficient documentation

## 2011-05-19 DIAGNOSIS — W06XXXA Fall from bed, initial encounter: Secondary | ICD-10-CM | POA: Insufficient documentation

## 2011-05-19 DIAGNOSIS — Y92009 Unspecified place in unspecified non-institutional (private) residence as the place of occurrence of the external cause: Secondary | ICD-10-CM | POA: Insufficient documentation

## 2011-05-19 HISTORY — DX: Fracture of neck, unspecified, initial encounter: S12.9XXA

## 2011-05-19 HISTORY — DX: Unspecified convulsions: R56.9

## 2011-05-19 MED ORDER — MAGNESIUM SULFATE 40 MG/ML IJ SOLN
2.0000 g | Freq: Once | INTRAMUSCULAR | Status: AC
  Administered 2011-05-19: 2 g via INTRAVENOUS
  Filled 2011-05-19: qty 50

## 2011-05-19 MED ORDER — DIPHENHYDRAMINE HCL 50 MG/ML IJ SOLN
50.0000 mg | Freq: Once | INTRAMUSCULAR | Status: AC
  Administered 2011-05-19: 50 mg via INTRAVENOUS
  Filled 2011-05-19: qty 1

## 2011-05-19 MED ORDER — VALPROATE SODIUM 500 MG/5ML IV SOLN
1000.0000 mg | Freq: Once | INTRAVENOUS | Status: DC
  Filled 2011-05-19: qty 10

## 2011-05-19 MED ORDER — METOCLOPRAMIDE HCL 5 MG/ML IJ SOLN
10.0000 mg | Freq: Once | INTRAMUSCULAR | Status: AC
Start: 2011-05-19 — End: 2011-05-19
  Administered 2011-05-19: 10 mg via INTRAVENOUS
  Filled 2011-05-19: qty 2

## 2011-05-19 MED ORDER — PROMETHAZINE HCL 25 MG PO TABS: 25.0000 mg | ORAL_TABLET | Freq: Four times a day (QID) | ORAL | Status: DC | PRN

## 2011-05-19 MED ORDER — KETOROLAC TROMETHAMINE 30 MG/ML IJ SOLN
30.0000 mg | Freq: Once | INTRAMUSCULAR | Status: AC
  Administered 2011-05-19: 30 mg via INTRAVENOUS
  Filled 2011-05-19: qty 1

## 2011-05-19 MED ORDER — VALPROATE SODIUM 500 MG/5ML IV SOLN
INTRAVENOUS | Status: AC
  Filled 2011-05-19: qty 10

## 2011-05-19 NOTE — ED Notes (Signed)
Family and patient are comfortable. Assisted patient to bathroom with wheel chair. Patient tolerated with limited assistance. Patient just states that her head is killing her.

## 2011-05-19 NOTE — ED Notes (Addendum)
Pt now requesting discharge instructions. Dr Lynelle Doctor notified and verbalized understanding.
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
## 2011-05-19 NOTE — ED Notes (Signed)
Migraine that started last night, nausea x 2 days.  Family states pt has had periods of disorientation and visual disturbances with memory loss.

## 2011-05-19 NOTE — ED Notes (Signed)
Pt a/ox4. Resp even and unlabored. NAD at this time. D/C instructions reviewed with pt. Pt verbalized understanding. Pt ambulated with steady gate to POV. 

## 2011-05-19 NOTE — ED Notes (Addendum)
Attempted to administer medication per order after Dr Lynelle Doctor spoke with pt. Upon bringing medication into room husband states " she made it sound like it was a shot". Pt and husband instructed medication would take 1 hour to infuse. Pt looked at husband and asked "dont take it?" Husband stated "NO!" Conformation of decision from pt requested and pt states "No i'm not going to take the medication". Dr Lynelle Doctor notified.

## 2011-05-19 NOTE — ED Notes (Signed)
Pt ambulatory to BR with husbands assistance. Husband states "can we find out what's going on with her". Pt states she is still having pain. Dr Lynelle Doctor notified.

## 2011-05-19 NOTE — ED Notes (Signed)
Pt refusing Depakote. Husband upset over length of stay. Food offered to both. Pt refused sandwich and husband given one. Approx 10 min later husband requesting second sandwich. Dr Lynelle Doctor notified of pt and husbands behavior.

## 2011-05-30 LAB — URINALYSIS, ROUTINE W REFLEX MICROSCOPIC
Leukocytes, UA: NEGATIVE
Specific Gravity, Urine: >1.03 — ABNORMAL HIGH
Urobilinogen, UA: 0.2

## 2011-05-30 LAB — URINE MICROSCOPIC-ADD ON

## 2011-06-04 LAB — URINALYSIS, ROUTINE W REFLEX MICROSCOPIC
Glucose, UA: NEGATIVE
Nitrite: NEGATIVE
pH: 5.5

## 2011-06-04 LAB — URINE MICROSCOPIC-ADD ON

## 2011-06-04 LAB — PREGNANCY, URINE: Preg Test, Ur: NEGATIVE

## 2011-06-05 LAB — URINALYSIS, ROUTINE W REFLEX MICROSCOPIC
Bilirubin Urine: NEGATIVE
Ketones, ur: NEGATIVE
Nitrite: POSITIVE — AB
Protein, ur: NEGATIVE
Protein, ur: NEGATIVE
Urobilinogen, UA: 0.2
Urobilinogen, UA: 0.2

## 2011-06-05 LAB — URINE MICROSCOPIC-ADD ON

## 2011-06-05 LAB — PREGNANCY, URINE: Preg Test, Ur: NEGATIVE

## 2011-06-06 LAB — URINALYSIS, ROUTINE W REFLEX MICROSCOPIC
Glucose, UA: NEGATIVE
Protein, ur: NEGATIVE
pH: 5

## 2011-06-06 LAB — URINE MICROSCOPIC-ADD ON

## 2011-06-06 LAB — URINE CULTURE

## 2011-06-11 LAB — COMPREHENSIVE METABOLIC PANEL
ALT: 17
Alkaline Phosphatase: 80
Creatinine, Ser: 0.58
GFR calc Af Amer: >60
GFR calc non Af Amer: >60
Total Bilirubin: 0.5

## 2011-06-11 LAB — CBC
MCHC: 34.3
MCV: 90.4
Platelets: 286
RDW: 13.9
WBC: 7.4

## 2011-06-11 LAB — URINALYSIS, ROUTINE W REFLEX MICROSCOPIC
Leukocytes, UA: NEGATIVE
Nitrite: NEGATIVE
Specific Gravity, Urine: 1.01
Urobilinogen, UA: 0.2

## 2011-06-11 LAB — URINE MICROSCOPIC-ADD ON

## 2011-06-11 LAB — DIFFERENTIAL
Basophils Absolute: 0
Basophils Relative: 0
Eosinophils Absolute: 0
Monocytes Relative: 4
Neutrophils Relative %: 79 — ABNORMAL HIGH

## 2011-06-16 LAB — CBC
MCV: 91.4
Platelets: 322
WBC: 4.9

## 2011-06-17 LAB — URINALYSIS, ROUTINE W REFLEX MICROSCOPIC
Bilirubin Urine: NEGATIVE
Ketones, ur: NEGATIVE
Nitrite: NEGATIVE
Specific Gravity, Urine: >1.03 — ABNORMAL HIGH
Urobilinogen, UA: 0.2

## 2011-06-17 LAB — PREGNANCY, URINE: Preg Test, Ur: NEGATIVE

## 2011-06-30 ENCOUNTER — Ambulatory Visit (INDEPENDENT_AMBULATORY_CARE_PROVIDER_SITE_OTHER): Payer: Medicaid Other | Admitting: Family Medicine

## 2011-06-30 ENCOUNTER — Encounter: Payer: Self-pay | Admitting: Family Medicine

## 2011-06-30 VITALS — BP 110/80 | HR 85 | Resp 16 | Ht 67.0 in | Wt 182.4 lb

## 2011-06-30 DIAGNOSIS — M25579 Pain in unspecified ankle and joints of unspecified foot: Secondary | ICD-10-CM

## 2011-06-30 DIAGNOSIS — F419 Anxiety disorder, unspecified: Secondary | ICD-10-CM

## 2011-06-30 DIAGNOSIS — M542 Cervicalgia: Secondary | ICD-10-CM

## 2011-06-30 DIAGNOSIS — M25571 Pain in right ankle and joints of right foot: Secondary | ICD-10-CM

## 2011-06-30 DIAGNOSIS — M545 Low back pain, unspecified: Secondary | ICD-10-CM

## 2011-06-30 DIAGNOSIS — F411 Generalized anxiety disorder: Secondary | ICD-10-CM

## 2011-06-30 NOTE — Patient Instructions (Signed)
For your right ankle and foot- we will set you up for an MRI I will get information from Dr. Gerilyn Pilgrim and Dr. Margo Aye  Based on MRI results you will be referred to Dr. Romeo Apple- orthopedic surgeon  Follow-up in 3 months
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
## 2011-07-01 ENCOUNTER — Encounter: Payer: Self-pay | Admitting: Family Medicine

## 2011-07-01 DIAGNOSIS — M25571 Pain in right ankle and joints of right foot: Secondary | ICD-10-CM | POA: Insufficient documentation

## 2011-07-01 DIAGNOSIS — F419 Anxiety disorder, unspecified: Secondary | ICD-10-CM | POA: Insufficient documentation
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
## 2011-07-01 NOTE — Assessment & Plan Note (Signed)
Pt has history of anxiety and depression. She is currently being treated by neurology. Will obtain records before changing any medications

## 2011-07-01 NOTE — Assessment & Plan Note (Signed)
Pt currently in pain clinic, I will get records from her previous physicians. She is not happy with her current meds.

## 2011-07-01 NOTE — Assessment & Plan Note (Addendum)
I reviewed notes from [facility] and x-ray, based on exam today pt still has swelling and limited ROM which is unusual this far out. Question if she has a tendon injury. Obtain MRI of foot Note she did ask for pain medication for her ankle, after we discussed about her already being in a pain clinic

## 2011-07-14 ENCOUNTER — Encounter: Payer: Self-pay | Admitting: Family Medicine

## 2011-07-14 NOTE — Progress Notes (Signed)
  Subjective:    Patient ID: [name], female    DOB: 28-Jun-1976, 35 y.o.   MRN: [id number]  HPI    Review of Systems     Objective:   Physical Exam        Assessment & Plan:  Patient is being followed by Dr.[place] for  pain clinic she is currently on oxycodone as well as Robaxin and Klonopin which he prescribed. He is also given her the Fioricet  Per the records patient was last seen by her PCP Dr. Margo Aye in 2011. At that time she was on Xanax, citalopram and hydrocodone.

## 2011-09-12 ENCOUNTER — Telehealth: Payer: Self-pay | Admitting: Family Medicine

## 2011-09-12 NOTE — Telephone Encounter (Signed)
noted 
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
## 2011-09-14 NOTE — Telephone Encounter (Signed)
Noted. Will discuss I will not prescribe any narcotics or benzo for patient.

## 2011-09-29 ENCOUNTER — Encounter: Payer: Self-pay | Admitting: Family Medicine

## 2011-09-29 ENCOUNTER — Ambulatory Visit (INDEPENDENT_AMBULATORY_CARE_PROVIDER_SITE_OTHER): Payer: Medicaid Other | Admitting: Family Medicine

## 2011-09-29 VITALS — BP 110/74 | HR 83 | Resp 16 | Ht 67.0 in | Wt 184.0 lb

## 2011-09-29 DIAGNOSIS — M25571 Pain in right ankle and joints of right foot: Secondary | ICD-10-CM

## 2011-09-29 DIAGNOSIS — M545 Low back pain, unspecified: Secondary | ICD-10-CM

## 2011-09-29 DIAGNOSIS — F411 Generalized anxiety disorder: Secondary | ICD-10-CM

## 2011-09-29 DIAGNOSIS — R569 Unspecified convulsions: Secondary | ICD-10-CM

## 2011-09-29 DIAGNOSIS — F419 Anxiety disorder, unspecified: Secondary | ICD-10-CM

## 2011-09-29 DIAGNOSIS — M25579 Pain in unspecified ankle and joints of unspecified foot: Secondary | ICD-10-CM

## 2011-09-29 MED ORDER — BUSPIRONE HCL 15 MG PO TABS: ORAL_TABLET | ORAL | Status: DC

## 2011-09-29 MED ORDER — TRAMADOL HCL 50 MG PO TABS: 50.0000 mg | ORAL_TABLET | Freq: Three times a day (TID) | ORAL | Status: AC | PRN

## 2011-09-29 NOTE — Assessment & Plan Note (Signed)
I discussed with pt I will not prescribe narcotic medication for her. It will be difficult to find a new pain doctor with her substance abuse and failed drug screen

## 2011-09-29 NOTE — Assessment & Plan Note (Addendum)
Persistent pain, will attempt MRI again, refer to ortho for evaluation, concern for ligamental injury Ultram for pain, pt has taken in the past Pt has been on Ibuprofen, Aleve,Narcotics in the past
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
## 2011-09-29 NOTE — Patient Instructions (Addendum)
Start the buspar - take 1/2 tablet twice a day for 1 week, then increase to 1 tablets in the AM and 1/2 tablet at night  X 1 week, then 1 tablet twice a day Use the tramadol as needed for pain I will refer you to orthopedics for your ankle F/U 4 weeks for anxiety and panic attacks

## 2011-09-29 NOTE — Assessment & Plan Note (Signed)
Start Buspar, I will not prescribe Benzo and this was explained to patient

## 2011-09-29 NOTE — Assessment & Plan Note (Signed)
Pt has not been taking Keppra, has been on ultram since diagnosis of seizure and had not problems, will prescribe for pain

## 2011-09-30 ENCOUNTER — Ambulatory Visit: Payer: Medicaid Other | Admitting: Family Medicine

## 2011-10-27 ENCOUNTER — Encounter: Payer: Self-pay | Admitting: Family Medicine

## 2011-10-27 ENCOUNTER — Ambulatory Visit (INDEPENDENT_AMBULATORY_CARE_PROVIDER_SITE_OTHER): Payer: Medicaid Other | Admitting: Family Medicine

## 2011-10-27 VITALS — BP 120/74 | HR 103 | Resp 16 | Ht 67.0 in | Wt 182.4 lb

## 2011-10-27 DIAGNOSIS — M25579 Pain in unspecified ankle and joints of unspecified foot: Secondary | ICD-10-CM

## 2011-10-27 DIAGNOSIS — F419 Anxiety disorder, unspecified: Secondary | ICD-10-CM

## 2011-10-27 DIAGNOSIS — R197 Diarrhea, unspecified: Secondary | ICD-10-CM | POA: Insufficient documentation

## 2011-10-27 DIAGNOSIS — E669 Obesity, unspecified: Secondary | ICD-10-CM

## 2011-10-27 DIAGNOSIS — R109 Unspecified abdominal pain: Secondary | ICD-10-CM

## 2011-10-27 DIAGNOSIS — F411 Generalized anxiety disorder: Secondary | ICD-10-CM

## 2011-10-27 DIAGNOSIS — M25571 Pain in right ankle and joints of right foot: Secondary | ICD-10-CM

## 2011-10-27 MED ORDER — TRAMADOL HCL 50 MG PO TABS: 50.0000 mg | ORAL_TABLET | Freq: Four times a day (QID) | ORAL | Status: DC | PRN

## 2011-10-27 MED ORDER — HYOSCYAMINE SULFATE 0.125 MG PO TABS: 0.1250 mg | ORAL_TABLET | ORAL | Status: AC | PRN

## 2011-10-27 MED ORDER — METHOCARBAMOL 500 MG PO TABS: 500.0000 mg | ORAL_TABLET | Freq: Four times a day (QID) | ORAL | Status: DC

## 2011-10-27 NOTE — Patient Instructions (Signed)
For your abdominal pain - try the levbid for spasm, I will set you up with GI  For your anxiety continue buspar For your pain- ultram increased to four times a day as needed Please get the bloodwork done- do not eat after midnight  F/U 2 months

## 2011-10-27 NOTE — Assessment & Plan Note (Signed)
Start levbid prn , will refer to GI for possible scope and work-up CT scan in March 2012 did not show any colitis
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
## 2011-10-27 NOTE — Assessment & Plan Note (Signed)
Continue buspar, pt doing well on medication

## 2011-10-27 NOTE — Assessment & Plan Note (Signed)
Ultram increased to QID, pt to follow-up with Ortho Note MRI unable to be pre-cert through my office, will defer to ortho

## 2011-11-05 ENCOUNTER — Ambulatory Visit: Payer: Medicaid Other | Admitting: Gastroenterology

## 2011-11-10 ENCOUNTER — Ambulatory Visit: Payer: Medicaid Other | Admitting: Gastroenterology

## 2011-11-11 ENCOUNTER — Telehealth: Payer: Self-pay | Admitting: Orthopedic Surgery

## 2011-11-11 NOTE — Telephone Encounter (Signed)
[name] cancelled her 11/12/11  appointment with Dr. Romeo Apple because she had not been to [facility] to get the XR film, XR report and ER  Report for  her right ankle.  She said she will call back to reschedule once she gets all this information together.   Thanks, will let you know when we hear from her again.

## 2011-11-12 ENCOUNTER — Ambulatory Visit: Payer: Medicaid Other | Admitting: Orthopedic Surgery

## 2011-12-01 ENCOUNTER — Other Ambulatory Visit: Payer: Self-pay | Admitting: Family Medicine

## 2011-12-01 ENCOUNTER — Other Ambulatory Visit: Payer: Self-pay

## 2011-12-01 MED ORDER — TRAMADOL HCL 50 MG PO TABS: 50.0000 mg | ORAL_TABLET | Freq: Four times a day (QID) | ORAL | Status: DC | PRN

## 2011-12-22 ENCOUNTER — Other Ambulatory Visit: Payer: Self-pay | Admitting: Family Medicine

## 2011-12-25 ENCOUNTER — Ambulatory Visit: Payer: Medicaid Other | Admitting: Family Medicine

## 2011-12-25 ENCOUNTER — Encounter: Payer: Self-pay | Admitting: Family Medicine

## 2012-01-28 ENCOUNTER — Other Ambulatory Visit: Payer: Self-pay | Admitting: Family Medicine

## 2012-01-28 ENCOUNTER — Telehealth: Payer: Self-pay | Admitting: Family Medicine

## 2012-01-28 MED ORDER — TRAMADOL HCL 50 MG PO TABS: 50.0000 mg | ORAL_TABLET | Freq: Four times a day (QID) | ORAL | Status: DC | PRN

## 2012-01-28 NOTE — Telephone Encounter (Signed)
Sent in refill for QID

## 2012-02-13 ENCOUNTER — Emergency Department (HOSPITAL_COMMUNITY): Payer: Medicaid Other

## 2012-02-13 ENCOUNTER — Encounter (HOSPITAL_COMMUNITY): Payer: Self-pay | Admitting: *Deleted

## 2012-02-13 ENCOUNTER — Emergency Department (HOSPITAL_COMMUNITY)
Admission: EM | Admit: 2012-02-13 | Discharge: 2012-02-14 | Disposition: A | Discharge status: Home / Self Care | Payer: Medicaid Other | Attending: Emergency Medicine | Admitting: Emergency Medicine

## 2012-02-13 DIAGNOSIS — R1031 Right lower quadrant pain: Secondary | ICD-10-CM | POA: Insufficient documentation

## 2012-02-13 DIAGNOSIS — R51 Headache: Secondary | ICD-10-CM | POA: Insufficient documentation

## 2012-02-13 DIAGNOSIS — R10813 Right lower quadrant abdominal tenderness: Secondary | ICD-10-CM | POA: Insufficient documentation

## 2012-02-13 DIAGNOSIS — N390 Urinary tract infection, site not specified: Secondary | ICD-10-CM | POA: Insufficient documentation

## 2012-02-13 LAB — COMPREHENSIVE METABOLIC PANEL
BUN: 7 mg/dL (ref 6–23)
CO2: 23 mEq/L (ref 19–32)
Chloride: 101 mEq/L (ref 96–112)
Creatinine, Ser: 0.66 mg/dL (ref 0.50–1.10)
GFR calc non Af Amer: >90 mL/min (ref >90)
Total Bilirubin: 0.3 mg/dL (ref 0.3–1.2)

## 2012-02-13 LAB — URINALYSIS, ROUTINE W REFLEX MICROSCOPIC
Glucose, UA: NEGATIVE mg/dL
Hgb urine dipstick: NEGATIVE
Protein, ur: NEGATIVE mg/dL
Specific Gravity, Urine: 1.02 (ref 1.005–1.030)

## 2012-02-13 LAB — DIFFERENTIAL
Lymphocytes Relative: 30 % (ref 12–46)
Lymphs Abs: 2 10*3/uL (ref 0.7–4.0)
Monocytes Absolute: 0.4 10*3/uL (ref 0.1–1.0)
Monocytes Relative: 6 % (ref 3–12)
Neutro Abs: 4.3 10*3/uL (ref 1.7–7.7)

## 2012-02-13 LAB — CBC
HCT: 38.4 % (ref 36.0–46.0)
Hemoglobin: 13.5 g/dL (ref 12.0–15.0)
WBC: 6.7 10*3/uL (ref 4.0–10.5)

## 2012-02-13 LAB — URINE MICROSCOPIC-ADD ON

## 2012-02-13 LAB — PREGNANCY, URINE: Preg Test, Ur: NEGATIVE

## 2012-02-13 MED ORDER — HYDROMORPHONE HCL PF 1 MG/ML IJ SOLN
1.0000 mg | Freq: Once | INTRAMUSCULAR | Status: AC
  Administered 2012-02-13: 1 mg via INTRAVENOUS
  Filled 2012-02-13: qty 1

## 2012-02-13 MED ORDER — SODIUM CHLORIDE 0.9 % IV SOLN
Freq: Once | INTRAVENOUS | Status: AC
  Administered 2012-02-13: 18:00:00 via INTRAVENOUS

## 2012-02-13 MED ORDER — ONDANSETRON HCL 4 MG/2ML IJ SOLN
4.0000 mg | Freq: Once | INTRAMUSCULAR | Status: AC
  Administered 2012-02-13: 4 mg via INTRAVENOUS
  Filled 2012-02-13: qty 2

## 2012-02-13 MED ORDER — KETOROLAC TROMETHAMINE 30 MG/ML IJ SOLN
30.0000 mg | Freq: Once | INTRAMUSCULAR | Status: AC
  Administered 2012-02-14: 30 mg via INTRAVENOUS
  Filled 2012-02-13: qty 1

## 2012-02-13 MED ORDER — CIPROFLOXACIN IN D5W 400 MG/200ML IV SOLN
400.0000 mg | Freq: Once | INTRAVENOUS | Status: AC
  Administered 2012-02-13: 400 mg via INTRAVENOUS
  Filled 2012-02-13: qty 200

## 2012-02-13 MED ORDER — IOHEXOL 300 MG/ML  SOLN
100.0000 mL | Freq: Once | INTRAMUSCULAR | Status: AC | PRN
  Administered 2012-02-13: 100 mL via INTRAVENOUS

## 2012-02-13 MED ORDER — LORAZEPAM 2 MG/ML IJ SOLN
1.0000 mg | Freq: Once | INTRAMUSCULAR | Status: AC
  Administered 2012-02-14: 2 mg via INTRAVENOUS
  Filled 2012-02-13: qty 1

## 2012-02-13 NOTE — ED Notes (Signed)
Patient transported to CT 

## 2012-02-13 NOTE — ED Provider Notes (Signed)
I assumed care of this patient from Dr. Estell Harpin at 10pm.  Right lower quadrant pain with nausea and vomiting for the past one day. History of ovarian cysts.  CT scan negative for appendicitis. We'll treat possible UTI. Patient has required multiple doses of Dilaudid to control her right lower quadrant pain. On pelvic exam patient has right adnexal fullness with significant tenderness to palpation. There is no cervical motion tenderness. Given her extreme pain we'll obtain ultrasound to evaluate for ovarian torsion. [place] pending at time of sign out to Dr. Deretha Emory.  Glynn Octave, MD 02/14/12 [phone]

## 2012-02-13 NOTE — ED Notes (Signed)
Right lower quad abd pain that started last night, low grade tempeture, admits to n/v

## 2012-02-13 NOTE — ED Provider Notes (Cosign Needed)
History     CSN: [id number]  Arrival date & time 02/13/12  1706   First MD Initiated Contact with Patient 02/13/12 1717      Chief Complaint  Patient presents with  . Abdominal Pain    (Consider location/radiation/quality/duration/timing/severity/associated sxs/prior treatment) Patient is a 36 y.o. female presenting with abdominal pain. The history is provided by the patient (the pt complains of llq abd. pain). No language interpreter was used.  Abdominal Pain The primary symptoms of the illness include abdominal pain. The primary symptoms of the illness do not include fever, fatigue, shortness of breath or diarrhea. The current episode started 13 to 24 hours ago. The onset of the illness was gradual. The problem has not changed since onset. Associated with: nothing. The patient states that she believes she is currently not pregnant. The patient has not had a change in bowel habit. Additional symptoms associated with the illness include heartburn. Symptoms associated with the illness do not include hematuria, frequency or back pain. Significant associated medical issues do not include PUD.    Past Medical History  Diagnosis Date  . Seizures   . Migraine   . Neck fracture     C1  . DJD (degenerative joint disease), lumbar   . Lymph node abscess     s/p removal  . Ovarian cyst     Followed by GYN Dr. Ralph Dowdy  . Anxiety   . Depression     Past Surgical History  Procedure Date  . Back surgery   . Tonsillectomy   . Tubal ligation   . Colposcopy     CIN-1 Dr. Ralph Dowdy (GYN)/ HPV    Family History  Problem Relation Age of Onset  . Heart disease Father     MI  . Depression Father   . Hyperlipidemia Mother   . Heart disease Maternal Grandfather   . Hyperlipidemia Maternal Grandfather   . Cancer Maternal Grandfather     Colon cancer    History  Substance Use Topics  . Smoking status: Former Games developer  . Smokeless tobacco: Not on file  . Alcohol Use: No    OB History    Grav Para Term Preterm Abortions TAB SAB Ect Mult Living                  Review of Systems  Constitutional: Negative for fever and fatigue.  HENT: Negative for congestion, sinus pressure and ear discharge.   Eyes: Negative for discharge.  Respiratory: Negative for cough and shortness of breath.   Cardiovascular: Negative for chest pain.  Gastrointestinal: Positive for heartburn and abdominal pain. Negative for diarrhea.  Genitourinary: Negative for frequency and hematuria.  Musculoskeletal: Negative for back pain.  Skin: Negative for rash.  Neurological: Negative for seizures and headaches.  Hematological: Negative.   Psychiatric/Behavioral: Negative for hallucinations.    Allergies  Darvocet and Penicillins  Home Medications   Current Outpatient Rx  Name Route Sig Dispense Refill  . BUSPIRONE HCL 15 MG PO TABS Oral Take 15 mg by mouth daily. Take 1 tablet twice a day    . METHOCARBAMOL 500 MG PO TABS Oral Take 1 tablet (500 mg total) by mouth 4 (four) times daily. 60 tablet 1  . TRAMADOL HCL 50 MG PO TABS  TAKE 1 TABLET 4 TIMES A DAY AS NEEDED 90 tablet 1    BP 117/64  Pulse 65  Temp(Src) 98 F (36.7 C) (Oral)  Resp 16  Ht 5\' 6"  (1.676 m)  Wt 170  lb (77.111 kg)  BMI 27.44 kg/m2  SpO2 97%  LMP 01/28/2012  Physical Exam  Constitutional: She is oriented to person, place, and time. She appears well-developed.  HENT:  Head: Normocephalic and atraumatic.  Eyes: Conjunctivae and EOM are normal. No scleral icterus.  Neck: Neck supple. No thyromegaly present.  Cardiovascular: Normal rate and regular rhythm.  Exam reveals no gallop and no friction rub.   No murmur heard. Pulmonary/Chest: No stridor. She has no wheezes. She has no rales. She exhibits no tenderness.  Abdominal: She exhibits no distension. There is tenderness. There is no rebound.       Tender rlq  Musculoskeletal: Normal range of motion. She exhibits no edema.  Lymphadenopathy:    She has no cervical  adenopathy.  Neurological: She is oriented to person, place, and time. Coordination normal.  Skin: No rash noted. No erythema.  Psychiatric: She has a normal mood and affect. Her behavior is normal.    ED Course  Procedures (including critical care time)  Labs Reviewed  URINALYSIS, ROUTINE W REFLEX MICROSCOPIC - Abnormal; Notable for the following:    Leukocytes, UA SMALL (*)    All other components within normal limits  COMPREHENSIVE METABOLIC PANEL - Abnormal; Notable for the following:    Potassium 3.3 (*)    All other components within normal limits  URINE MICROSCOPIC-ADD ON - Abnormal; Notable for the following:    Squamous Epithelial / LPF FEW (*)    Bacteria, UA FEW (*)    All other components within normal limits  PREGNANCY, URINE  CBC  DIFFERENTIAL   No results found.   No diagnosis found.   Results for orders placed during the hospital encounter of 02/13/12  URINALYSIS, ROUTINE W REFLEX MICROSCOPIC      Component Value Range   Color, Urine YELLOW  YELLOW    APPearance CLEAR  CLEAR    Specific Gravity, Urine 1.020  1.005 - 1.030    pH 6.0  5.0 - 8.0    Glucose, UA NEGATIVE  NEGATIVE (mg/dL)   Hgb urine dipstick NEGATIVE  NEGATIVE    Bilirubin Urine NEGATIVE  NEGATIVE    Ketones, ur NEGATIVE  NEGATIVE (mg/dL)   Protein, ur NEGATIVE  NEGATIVE (mg/dL)   Urobilinogen, UA 0.2  0.0 - 1.0 (mg/dL)   Nitrite NEGATIVE  NEGATIVE    Leukocytes, UA SMALL (*) NEGATIVE   PREGNANCY, URINE      Component Value Range   Preg Test, Ur NEGATIVE  NEGATIVE   CBC      Component Value Range   WBC 6.7  4.0 - 10.5 (K/uL)   RBC 4.40  3.87 - 5.11 (MIL/uL)   Hemoglobin 13.5  12.0 - 15.0 (g/dL)   HCT 16.1  09.6 - 04.5 (%)   MCV 87.3  78.0 - 100.0 (fL)   MCH 30.7  26.0 - 34.0 (pg)   MCHC 35.2  30.0 - 36.0 (g/dL)   RDW 40.9  81.1 - 91.4 (%)   Platelets 200  150 - 400 (K/uL)  DIFFERENTIAL      Component Value Range   Neutrophils Relative 64  43 - 77 (%)   Neutro Abs 4.3  1.7 -  7.7 (K/uL)   Lymphocytes Relative 30  12 - 46 (%)   Lymphs Abs 2.0  0.7 - 4.0 (K/uL)   Monocytes Relative 6  3 - 12 (%)   Monocytes Absolute 0.4  0.1 - 1.0 (K/uL)   Eosinophils Relative 0  0 - 5 (%)  Eosinophils Absolute 0.0  0.0 - 0.7 (K/uL)   Basophils Relative 0  0 - 1 (%)   Basophils Absolute 0.0  0.0 - 0.1 (K/uL)  COMPREHENSIVE METABOLIC PANEL      Component Value Range   Sodium 135  135 - 145 (mEq/L)   Potassium 3.3 (*) 3.5 - 5.1 (mEq/L)   Chloride 101  96 - 112 (mEq/L)   CO2 23  19 - 32 (mEq/L)   Glucose, Bld 98  70 - 99 (mg/dL)   BUN 7  6 - 23 (mg/dL)   Creatinine, Ser 1.61  0.50 - 1.10 (mg/dL)   Calcium 9.2  8.4 - 09.6 (mg/dL)   Total Protein 6.8  6.0 - 8.3 (g/dL)   Albumin 3.8  3.5 - 5.2 (g/dL)   AST 11  0 - 37 (U/L)   ALT 9  0 - 35 (U/L)   Alkaline Phosphatase 63  39 - 117 (U/L)   Total Bilirubin 0.3  0.3 - 1.2 (mg/dL)   GFR calc non Af Amer >90  >90 (mL/min)   GFR calc Af Amer >90  >90 (mL/min)  URINE MICROSCOPIC-ADD ON      Component Value Range   Squamous Epithelial / LPF FEW (*) RARE    WBC, UA 7-10  <3 (WBC/hpf)   RBC / HPF 0-2  <3 (RBC/hpf)   Bacteria, UA FEW (*) RARE    Urine-Other MUCOUS PRESENT     No results found.   MDM          Benny Lennert, MD 02/15/12 [phone]

## 2012-02-14 LAB — WET PREP, GENITAL
Clue Cells Wet Prep HPF POC: NONE SEEN
Yeast Wet Prep HPF POC: NONE SEEN

## 2012-02-14 MED ORDER — HYDROCODONE-ACETAMINOPHEN 5-325 MG PO TABS: 1.0000 | ORAL_TABLET | Freq: Four times a day (QID) | ORAL | Status: AC | PRN

## 2012-02-14 MED ORDER — CIPROFLOXACIN HCL 500 MG PO TABS: 500.0000 mg | ORAL_TABLET | Freq: Two times a day (BID) | ORAL | Status: AC

## 2012-02-14 NOTE — ED Notes (Signed)
Pt alert & oriented x4, stable gait. Pt given discharge instructions, paperwork & prescription(s). Patient instructed to stop at the registration desk to finish any additional paperwork. pt verbalized understanding. Pt left department w/ no further questions.  

## 2012-02-14 NOTE — Discharge Instructions (Signed)
CT scan and ultrasound of the pelvis without significant abnormalities. Labs show probable urinary tract infection. Treat with antibiotic take the antibiotic as directed. Take pain medicine as directed. Followup with your doctor in the next 2 days if not better or return here. Resource guide provided below if you need a referral to her primary care Dr.  Sheila Oats GUIDE  Chronic Pain Problems: Contact [facility]  [phone] Patients need to be referred by their primary care doctor.  Insufficient Money for Medicine: Contact United Way:  call "211" or [facility] ([phone].  No Primary Care Doctor: - Call [facility]  [phone] - can help you locate a primary care doctor that  accepts your insurance, provides certain services, etc. - Physician Referral Service- [phone]  Agencies that provide inexpensive medical care: - [facility]  [phone] - [facility]  [phone] - [facility]  [phone] - Women's Clinic  [phone] - Planned Parenthood  [phone] [facility]  [phone]  Medicaid-accepting [facility] Providers: - [facility]- [address]  [phone], Mon-Fri 9am-7pm, Sat 9am-1pm - [facility]- [address] [place], [address] [place]  [phone] - [facility]- [address] [place]  [phone] [facility]- [address]  [phone] - Renaye Rakers- [address] [place], [address], [phone]  Only accepts [place] patients after they have their name  applied to their card  Self Pay (no insurance) in [place]: - Sickle Cell Patients: Dr Willey Blade, [facility]  [address] [place], [phone] - [facility]- [address] [place]  [phone]       [facility] [place]- [address]       -     [facility]- see  information above (Speak to [employer] if you do not have insurance)       -  [facility]- [address] [place], [phone]       -  [facility]- [address],  [phone]       -  [facility]- [address], [phone]       -  Dr Julio Sicks-  [address], [place], [phone]       -  [facility]- [address] [phone]       -  [facility]- [address] [phone], also [address] [phone]       -    [facility]- [address] [place], [phone], 1st & 3rd Saturday   every month, 10am-1pm  1) Find a Doctor and Pay Out of Pocket Although you won't have to find out who is covered by your insurance plan, it is a good idea to ask around and get recommendations. You will then need to call the office and see if the doctor you have chosen will accept you as a new patient and what types of options they offer for patients who are self-pay. Some doctors offer discounts or will set up payment plans for their patients who do not have insurance, but you will need to ask so you aren't surprised when you get to your appointment.  2) Contact Your Local Health Department Not all health departments have  doctors that can see patients for sick visits, but many do, so it is worth a call to see if yours does. If you don't know where your local health department is, you can check in your phone book. The CDC also has a tool to help you locate your state's health department, and many state websites also have listings of all of their local health departments.  3) Find a Walk-in Clinic If your illness is not likely to be very severe or complicated, you may want to try a walk in clinic. These are popping up all over the country in pharmacies, drugstores, and shopping centers. They're usually staffed by nurse practitioners or physician assistants that have been trained to treat common illnesses and complaints. They're usually fairly  quick and inexpensive. However, if you have serious medical issues or chronic medical problems, these are probably not your best option  STD Testing - [facility] Department of [facility], STD Clinic, [address] [place], phone [phone] or [phone].  Monday - Friday, call for an appointment. [facility] Department of [employer], STD Clinic, [address], [place], phone [phone] or [phone].  Monday - Friday, call for an appointment.  Abuse/Neglect: [facility] Child Abuse Hotline 361-540-1083 [facility] Child Abuse Hotline (850)404-0941 (After Hours)  Emergency Shelter:  [facility] (680)234-0947  Maternity Homes: - [facility] [facility] [phone] - [facility] [phone]  MRSA Hotline #:   6471125847  [facility] Resources  [facility]  [employer] [facility] Dept. [address]                 [address]         [address] [place] [address]  Blondell Reveal Phone:  [phone]                                  Phone:  [phone]                   Phone:  [phone]  [facility] Mental Health, [phone] - [phone]       -     [facility] in [place], [address]                                  [phone], [facility] Child Abuse Hotline 309-745-3462 or 7171880080 (After Hours)   Behavioral Health Services  Substance Abuse Resources: - [facility]  [phone] - [facility] ([phone] - [facility] 3017069211 Floydene Flock [phone] - Residential & Outpatient Substance Abuse Program  [phone]  Psychological Services: [facility]   [phone] Services  [phone] - [facility], [phone]  [address], [place], ACCESS LINE: [phone] or ([phone], [employer]  Dental Assistance  If unable to pay or uninsured, contact:  [facility] or [facility] to become qualified for the adult dental clinic.  Patients with Medicaid: [facility] [phone] [address], [phone] [address] [phone]  If unable to pay, or uninsured, contact [facility] [phone]) or [facility] Department [phone] in [place], [phone] in [facility]) to become qualified for the adult dental clinic  Other Low-Cost Community Dental Services: - [facility]- [address] [place], [place], [place], [address], [phone], Ext. [phone], 2nd and 4th Thursday of the month at 6:30am.  10 clients each day by appointment, can sometimes see walk-in patients if someone does not show for an appointment. [facility]- [address] [place], [place], [address], [phone] - [facility]- [address], [place], [place], [address], [phone] - [facility]- [phone] [facility] Department- [phone] [facility] Department- [phone]

## 2012-02-14 NOTE — ED Provider Notes (Signed)
CT and ultrasound abdomen pelvis without significant abnormalities. Urine suggestive of urinary tract infection. Said the cause of right adnexal tenderness is not clear. Will treat with the pain medicine. Take antibiotic Cipro for the urinary tract infection. It is possible urinary tract infection as cause of the pain. Followup if not better in 2 days or if worse. As stated above no evidence of ovarian cyst to explain the right adnexal tenderness on pelvic origin explanation of that on the CT scan.  Results for orders placed during the hospital encounter of 02/13/12  URINALYSIS, ROUTINE W REFLEX MICROSCOPIC      Component Value Range   Color, Urine YELLOW  YELLOW    APPearance CLEAR  CLEAR    Specific Gravity, Urine 1.020  1.005 - 1.030    pH 6.0  5.0 - 8.0    Glucose, UA NEGATIVE  NEGATIVE (mg/dL)   Hgb urine dipstick NEGATIVE  NEGATIVE    Bilirubin Urine NEGATIVE  NEGATIVE    Ketones, ur NEGATIVE  NEGATIVE (mg/dL)   Protein, ur NEGATIVE  NEGATIVE (mg/dL)   Urobilinogen, UA 0.2  0.0 - 1.0 (mg/dL)   Nitrite NEGATIVE  NEGATIVE    Leukocytes, UA SMALL (*) NEGATIVE   PREGNANCY, URINE      Component Value Range   Preg Test, Ur NEGATIVE  NEGATIVE   CBC      Component Value Range   WBC 6.7  4.0 - 10.5 (K/uL)   RBC 4.40  3.87 - 5.11 (MIL/uL)   Hemoglobin 13.5  12.0 - 15.0 (g/dL)   HCT 82.9  56.2 - 13.0 (%)   MCV 87.3  78.0 - 100.0 (fL)   MCH 30.7  26.0 - 34.0 (pg)   MCHC 35.2  30.0 - 36.0 (g/dL)   RDW 86.5  78.4 - 69.6 (%)   Platelets 200  150 - 400 (K/uL)  DIFFERENTIAL      Component Value Range   Neutrophils Relative 64  43 - 77 (%)   Neutro Abs 4.3  1.7 - 7.7 (K/uL)   Lymphocytes Relative 30  12 - 46 (%)   Lymphs Abs 2.0  0.7 - 4.0 (K/uL)   Monocytes Relative 6  3 - 12 (%)   Monocytes Absolute 0.4  0.1 - 1.0 (K/uL)   Eosinophils Relative 0  0 - 5 (%)   Eosinophils Absolute 0.0  0.0 - 0.7 (K/uL)   Basophils Relative 0  0 - 1 (%)   Basophils Absolute 0.0  0.0 - 0.1 (K/uL)    COMPREHENSIVE METABOLIC PANEL      Component Value Range   Sodium 135  135 - 145 (mEq/L)   Potassium 3.3 (*) 3.5 - 5.1 (mEq/L)   Chloride 101  96 - 112 (mEq/L)   CO2 23  19 - 32 (mEq/L)   Glucose, Bld 98  70 - 99 (mg/dL)   BUN 7  6 - 23 (mg/dL)   Creatinine, Ser 2.95  0.50 - 1.10 (mg/dL)   Calcium 9.2  8.4 - 28.4 (mg/dL)   Total Protein 6.8  6.0 - 8.3 (g/dL)   Albumin 3.8  3.5 - 5.2 (g/dL)   AST 11  0 - 37 (U/L)   ALT 9  0 - 35 (U/L)   Alkaline Phosphatase 63  39 - 117 (U/L)   Total Bilirubin 0.3  0.3 - 1.2 (mg/dL)   GFR calc non Af Amer >90  >90 (mL/min)   GFR calc Af Amer >90  >90 (mL/min)  URINE MICROSCOPIC-ADD ON  Component Value Range   Squamous Epithelial / LPF FEW (*) RARE    WBC, UA 7-10  <3 (WBC/hpf)   RBC / HPF 0-2  <3 (RBC/hpf)   Bacteria, UA FEW (*) RARE    Urine-Other MUCOUS PRESENT    WET PREP, GENITAL      Component Value Range   Yeast Wet Prep HPF POC NONE SEEN  NONE SEEN    Trich, Wet Prep NONE SEEN  NONE SEEN    Clue Cells Wet Prep HPF POC NONE SEEN  NONE SEEN    WBC, Wet Prep HPF POC FEW (*) NONE SEEN    US Transvaginal Non-ob  02/14/2012  *RADIOLOGY REPORT*  Clinical Data:  Right adnexal pain, evaluate for torsion  TRANSABDOMINAL AND TRANSVAGINAL ULTRASOUND OF PELVIS DOPPLER ULTRASOUND OF OVARIES  Technique:  Both transabdominal and transvaginal ultrasound examinations of the pelvis were performed. Transabdominal technique was performed for global imaging of the pelvis including uterus, ovaries, adnexal regions, and pelvic cul-de-sac.  It was necessary to proceed with endovaginal exam following the transabdominal exam to visualize the endometrium and left ovary.  Color and duplex Doppler ultrasound was utilized to evaluate blood flow to the ovaries.  Comparison:  CT abdomen pelvis dated 02/13/2012  Findings:  Uterus:  Normal in size and appearance, measuring 7.1 x 3.8 x 5.3 cm.  Endometrium:  Normal in thickness and appearance, measuring 9 mm.  Right ovary:  Normal appearance/no adnexal mass, measuring 3.8 x 3.0 x 2.9 cm.  1.2 cm corpus luteal cyst.  Left ovary:   Normal appearance/no adnexal mass, measuring 3.0 x 2.2 x 3.1 cm.  Pulsed Doppler evaluation demonstrates normal low-resistance arterial and venous waveforms in both ovaries.  Additional comments:  No free fluid.  IMPRESSION: Normal exam.  No evidence of pelvic mass or other significant abnormality.  No sonographic evidence for ovarian torsion.  Original Report Authenticated By: Charline Bills, M.D.   US Pelvis Complete  02/14/2012  *RADIOLOGY REPORT*  Clinical Data:  Right adnexal pain, evaluate for torsion  TRANSABDOMINAL AND TRANSVAGINAL ULTRASOUND OF PELVIS DOPPLER ULTRASOUND OF OVARIES  Technique:  Both transabdominal and transvaginal ultrasound examinations of the pelvis were performed. Transabdominal technique was performed for global imaging of the pelvis including uterus, ovaries, adnexal regions, and pelvic cul-de-sac.  It was necessary to proceed with endovaginal exam following the transabdominal exam to visualize the endometrium and left ovary.  Color and duplex Doppler ultrasound was utilized to evaluate blood flow to the ovaries.  Comparison:  CT abdomen pelvis dated 02/13/2012  Findings:  Uterus:  Normal in size and appearance, measuring 7.1 x 3.8 x 5.3 cm.  Endometrium:  Normal in thickness and appearance, measuring 9 mm.  Right ovary: Normal appearance/no adnexal mass, measuring 3.8 x 3.0 x 2.9 cm.  1.2 cm corpus luteal cyst.  Left ovary:   Normal appearance/no adnexal mass, measuring 3.0 x 2.2 x 3.1 cm.  Pulsed Doppler evaluation demonstrates normal low-resistance arterial and venous waveforms in both ovaries.  Additional comments:  No free fluid.  IMPRESSION: Normal exam.  No evidence of pelvic mass or other significant abnormality.  No sonographic evidence for ovarian torsion.  Original Report Authenticated By: Charline Bills, M.D.   Ct Abdomen Pelvis W Contrast  02/13/2012   *RADIOLOGY REPORT*  Clinical Data: Right lower quadrant pain  CT ABDOMEN AND PELVIS WITH CONTRAST  Technique:  Multidetector CT imaging of the abdomen and pelvis was performed following the standard protocol during bolus administration of intravenous contrast.  Contrast:  OMNIPAQUE IOHEXOL 300 MG/ML  SOLN  Comparison: 12/01/2010  Findings: Cystic focus within the right lower lobe is unchanged. Otherwise lung bases are clear.  No pleural or pericardial effusion.  Focal hypoattenuation along the falciform ligament is favored to reflect focal fat.  Otherwise unremarkable liver, spleen, biliary system, pancreas, adrenal glands.  Symmetric renal enhancement. Lobular renal contours.  No hydronephrosis or hydroureter.  No bowel obstruction.  No CT evidence for colitis.  Normal appendix.  No free intraperitoneal air or fluid.  No lymphadenopathy.  Partially decompressed bladder.  Unremarkable CT appearance to the uterus and adnexa.  Normal caliber vasculature.  No acute osseous finding.  IMPRESSION: Normal appendix.  No hydronephrosis.  Circumferential bladder wall thickening is nonspecific given incomplete distension.  Correlate with urinalysis if concerned for cystitis.  Original Report Authenticated By: Waneta Martins, M.D.   [place] Art/ven Flow Abd Pelv Doppler  02/14/2012  *RADIOLOGY REPORT*  Clinical Data:  Right adnexal pain, evaluate for torsion  TRANSABDOMINAL AND TRANSVAGINAL ULTRASOUND OF PELVIS DOPPLER ULTRASOUND OF OVARIES  Technique:  Both transabdominal and transvaginal ultrasound examinations of the pelvis were performed. Transabdominal technique was performed for global imaging of the pelvis including uterus, ovaries, adnexal regions, and pelvic cul-de-sac.  It was necessary to proceed with endovaginal exam following the transabdominal exam to visualize the endometrium and left ovary.  Color and duplex Doppler ultrasound was utilized to evaluate blood flow to the ovaries.  Comparison:  CT abdomen  pelvis dated 02/13/2012  Findings:  Uterus:  Normal in size and appearance, measuring 7.1 x 3.8 x 5.3 cm.  Endometrium:  Normal in thickness and appearance, measuring 9 mm.  Right ovary: Normal appearance/no adnexal mass, measuring 3.8 x 3.0 x 2.9 cm.  1.2 cm corpus luteal cyst.  Left ovary:   Normal appearance/no adnexal mass, measuring 3.0 x 2.2 x 3.1 cm.  Pulsed Doppler evaluation demonstrates normal low-resistance arterial and venous waveforms in both ovaries.  Additional comments:  No free fluid.  IMPRESSION: Normal exam.  No evidence of pelvic mass or other significant abnormality.  No sonographic evidence for ovarian torsion.  Original Report Authenticated By: Charline Bills, M.D.      Shelda Jakes, MD 02/14/12 [phone]

## 2012-02-16 LAB — GC/CHLAMYDIA PROBE AMP, GENITAL: GC Probe Amp, Genital: NEGATIVE

## 2012-02-23 ENCOUNTER — Telehealth (INDEPENDENT_AMBULATORY_CARE_PROVIDER_SITE_OTHER): Payer: Self-pay

## 2012-02-24 ENCOUNTER — Ambulatory Visit: Payer: Medicaid Other | Admitting: Family Medicine

## 2012-03-15 ENCOUNTER — Ambulatory Visit: Payer: Medicaid Other | Admitting: Family Medicine

## 2012-03-23 ENCOUNTER — Other Ambulatory Visit: Payer: Self-pay | Admitting: Family Medicine

## 2012-03-30 ENCOUNTER — Ambulatory Visit: Payer: Medicaid Other | Admitting: Family Medicine

## 2012-04-12 ENCOUNTER — Ambulatory Visit: Payer: Medicaid Other | Admitting: Family Medicine

## 2012-04-29 ENCOUNTER — Other Ambulatory Visit: Payer: Self-pay | Admitting: Family Medicine

## 2012-05-07 ENCOUNTER — Telehealth: Payer: Self-pay | Admitting: Family Medicine

## 2012-05-07 MED ORDER — TRAMADOL HCL 50 MG PO TABS: ORAL_TABLET | ORAL | Status: DC

## 2012-05-07 NOTE — Telephone Encounter (Signed)
[facility] has sent a refill request for ultram- nothing wrong  [facility]

## 2012-06-03 ENCOUNTER — Other Ambulatory Visit: Payer: Self-pay | Admitting: Family Medicine

## 2012-06-10 ENCOUNTER — Encounter (HOSPITAL_COMMUNITY): Payer: Self-pay | Admitting: *Deleted

## 2012-06-10 ENCOUNTER — Emergency Department (HOSPITAL_COMMUNITY): Payer: No Typology Code available for payment source

## 2012-06-10 ENCOUNTER — Other Ambulatory Visit: Payer: Self-pay | Admitting: Family Medicine

## 2012-06-10 ENCOUNTER — Emergency Department (HOSPITAL_COMMUNITY)
Admission: EM | Admit: 2012-06-10 | Discharge: 2012-06-10 | Disposition: A | Discharge status: Home / Self Care | Payer: No Typology Code available for payment source | Attending: Emergency Medicine | Admitting: Emergency Medicine

## 2012-06-10 DIAGNOSIS — S139XXA Sprain of joints and ligaments of unspecified parts of neck, initial encounter: Secondary | ICD-10-CM | POA: Insufficient documentation

## 2012-06-10 DIAGNOSIS — R209 Unspecified disturbances of skin sensation: Secondary | ICD-10-CM | POA: Insufficient documentation

## 2012-06-10 DIAGNOSIS — S161XXA Strain of muscle, fascia and tendon at neck level, initial encounter: Secondary | ICD-10-CM

## 2012-06-10 DIAGNOSIS — M47817 Spondylosis without myelopathy or radiculopathy, lumbosacral region: Secondary | ICD-10-CM | POA: Insufficient documentation

## 2012-06-10 MED ORDER — OXYCODONE-ACETAMINOPHEN 5-500 MG PO CAPS: 1.0000 | ORAL_CAPSULE | Freq: Four times a day (QID) | ORAL | Status: DC | PRN

## 2012-06-10 NOTE — ED Notes (Signed)
Patient with no complaints at this time. Respirations even and unlabored. Skin warm/dry. Discharge instructions reviewed with patient at this time. Patient given opportunity to voice concerns/ask questions. Patient discharged at this time and left Emergency Department with steady gait.   
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
## 2012-06-11 ENCOUNTER — Other Ambulatory Visit: Payer: Self-pay | Admitting: Family Medicine

## 2012-06-11 NOTE — ED Provider Notes (Signed)
Medical screening examination/treatment/procedure(s) were performed by non-physician practitioner and as supervising physician I was immediately available for consultation/collaboration.   Hurman Horn, MD 06/11/12 [phone]

## 2012-07-01 ENCOUNTER — Encounter: Payer: Self-pay | Admitting: Family Medicine

## 2012-07-01 ENCOUNTER — Ambulatory Visit (INDEPENDENT_AMBULATORY_CARE_PROVIDER_SITE_OTHER): Payer: Medicaid Other | Admitting: Family Medicine

## 2012-07-01 VITALS — BP 136/84 | HR 106 | Resp 18 | Ht 67.0 in | Wt 159.1 lb

## 2012-07-01 DIAGNOSIS — M545 Low back pain, unspecified: Secondary | ICD-10-CM

## 2012-07-01 DIAGNOSIS — F411 Generalized anxiety disorder: Secondary | ICD-10-CM

## 2012-07-01 DIAGNOSIS — R569 Unspecified convulsions: Secondary | ICD-10-CM

## 2012-07-01 DIAGNOSIS — Z23 Encounter for immunization: Secondary | ICD-10-CM

## 2012-07-01 DIAGNOSIS — M542 Cervicalgia: Secondary | ICD-10-CM

## 2012-07-01 DIAGNOSIS — F419 Anxiety disorder, unspecified: Secondary | ICD-10-CM

## 2012-07-01 MED ORDER — TRAMADOL HCL 50 MG PO TABS: ORAL_TABLET | ORAL | Status: DC

## 2012-07-01 MED ORDER — METHOCARBAMOL 500 MG PO TABS: 500.0000 mg | ORAL_TABLET | Freq: Four times a day (QID) | ORAL | Status: DC
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
## 2012-07-01 NOTE — Patient Instructions (Signed)
Medication sent for 30 day supple Use muscle relaxant and use warm towels /heating pad  Flu shot given F/U 6 months for physical PAP
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
## 2012-07-02 NOTE — Assessment & Plan Note (Signed)
Very high stress at this time. She seems to be coping event setting of having joint for a custody battle and financial problems at home. She stopped her BuSpar she is unable to afford other she is continued on her pain medication.

## 2012-07-02 NOTE — Assessment & Plan Note (Signed)
Doing well off of medications.  

## 2012-07-02 NOTE — Assessment & Plan Note (Signed)
Robaxin and tramadol refilled.  

## 2012-07-19 ENCOUNTER — Telehealth: Payer: Self-pay | Admitting: Family Medicine

## 2012-07-20 MED ORDER — PREDNISONE 10 MG PO TABS: ORAL_TABLET | ORAL | Status: DC

## 2012-07-20 NOTE — Telephone Encounter (Signed)
I recommend prednisone 1 tablet twice a day for 5 days Benadryl.  If this does not improve by Thursday, needs to come in for visit

## 2012-07-20 NOTE — Telephone Encounter (Signed)
Pt aware.

## 2012-07-20 NOTE — Telephone Encounter (Signed)
Sunday am she woke up with a rash on her left arm, red rash from elbow to wrist. States its whelps on her arm around fingertip size. Has no way to get here for ov. Now today its on right arm she has red rash from wrist up past her elbow. Has been going through a lot of stress and wonders if it could be her nerves,. States things have been really bad at home. Wants to know what you recommend to treat this

## 2012-07-27 ENCOUNTER — Other Ambulatory Visit: Payer: Self-pay | Admitting: Family Medicine

## 2012-08-12 ENCOUNTER — Telehealth: Payer: Self-pay | Admitting: Family Medicine

## 2012-08-12 NOTE — Telephone Encounter (Signed)
Called patient and left message for them to return call at the office   

## 2012-08-13 NOTE — Telephone Encounter (Signed)
Patient aware that she can go to urgent care for eval since running a 101 fever and call for follow up next week

## 2012-10-18 ENCOUNTER — Ambulatory Visit: Payer: Medicaid Other | Admitting: Family Medicine

## 2012-11-02 ENCOUNTER — Other Ambulatory Visit: Payer: Self-pay | Admitting: Family Medicine

## 2012-12-30 ENCOUNTER — Ambulatory Visit: Payer: Medicaid Other | Admitting: Family Medicine

## 2013-01-03 ENCOUNTER — Other Ambulatory Visit: Payer: Self-pay | Admitting: Family Medicine

## 2013-01-03 ENCOUNTER — Telehealth: Payer: Self-pay | Admitting: Family Medicine

## 2013-01-03 ENCOUNTER — Other Ambulatory Visit: Payer: Self-pay

## 2013-01-03 MED ORDER — METHOCARBAMOL 500 MG PO TABS: 500.0000 mg | ORAL_TABLET | Freq: Four times a day (QID) | ORAL | Status: DC

## 2013-01-03 NOTE — Telephone Encounter (Signed)
Med refilled.

## 2013-01-10 ENCOUNTER — Other Ambulatory Visit (HOSPITAL_COMMUNITY)
Admission: RE | Admit: 2013-01-10 | Discharge: 2013-01-10 | Disposition: A | Discharge status: Home / Self Care | Payer: Medicaid Other | Source: Ambulatory Visit | Attending: Family Medicine | Admitting: Family Medicine

## 2013-01-10 ENCOUNTER — Ambulatory Visit (INDEPENDENT_AMBULATORY_CARE_PROVIDER_SITE_OTHER): Payer: Medicaid Other | Admitting: Family Medicine

## 2013-01-10 ENCOUNTER — Encounter: Payer: Self-pay | Admitting: Family Medicine

## 2013-01-10 VITALS — BP 108/68 | HR 80 | Resp 18 | Ht 67.0 in | Wt 156.1 lb

## 2013-01-10 DIAGNOSIS — Z01419 Encounter for gynecological examination (general) (routine) without abnormal findings: Secondary | ICD-10-CM | POA: Insufficient documentation

## 2013-01-10 DIAGNOSIS — R102 Pelvic and perineal pain: Secondary | ICD-10-CM | POA: Insufficient documentation

## 2013-01-10 DIAGNOSIS — F418 Other specified anxiety disorders: Secondary | ICD-10-CM

## 2013-01-10 DIAGNOSIS — G43009 Migraine without aura, not intractable, without status migrainosus: Secondary | ICD-10-CM

## 2013-01-10 DIAGNOSIS — Z13220 Encounter for screening for lipoid disorders: Secondary | ICD-10-CM

## 2013-01-10 DIAGNOSIS — Z0000 Encounter for general adult medical examination without abnormal findings: Secondary | ICD-10-CM

## 2013-01-10 DIAGNOSIS — N949 Unspecified condition associated with female genital organs and menstrual cycle: Secondary | ICD-10-CM

## 2013-01-10 DIAGNOSIS — R197 Diarrhea, unspecified: Secondary | ICD-10-CM

## 2013-01-10 DIAGNOSIS — N760 Acute vaginitis: Secondary | ICD-10-CM | POA: Insufficient documentation

## 2013-01-10 DIAGNOSIS — F341 Dysthymic disorder: Secondary | ICD-10-CM

## 2013-01-10 MED ORDER — DICYCLOMINE HCL 10 MG PO CAPS: 10.0000 mg | ORAL_CAPSULE | Freq: Four times a day (QID) | ORAL | Status: DC | PRN

## 2013-01-10 MED ORDER — SUMATRIPTAN SUCCINATE 100 MG PO TABS: 100.0000 mg | ORAL_TABLET | Freq: Once | ORAL | Status: DC | PRN

## 2013-01-10 MED ORDER — BUTALBITAL-APAP-CAFF-COD 50-325-40-30 MG PO CAPS: 1.0000 | ORAL_CAPSULE | ORAL | Status: DC | PRN

## 2013-01-10 MED ORDER — TRAMADOL HCL 50 MG PO TABS: ORAL_TABLET | ORAL | Status: DC

## 2013-01-10 MED ORDER — CYCLOBENZAPRINE HCL 10 MG PO TABS: 10.0000 mg | ORAL_TABLET | Freq: Two times a day (BID) | ORAL | Status: DC | PRN

## 2013-01-10 NOTE — Assessment & Plan Note (Signed)
Vaginal ultrasound to be done, rule out ovarian pathology Consider use of OCP

## 2013-01-10 NOTE — Progress Notes (Signed)
  Subjective:    Patient ID: [name], female    DOB: November 01, 1975, 37 y.o.   MRN: [id number]  HPI  Pt here for GYN exam and medications. Last PAP Smear 2 years ago, had biopsy done for abnormal PAP Has been having irregular cycles recently and heavy cycles with severe pain , told she had a cyst on ovaries in the past, uses 1-2 pads at a time and bleeds through, LMP 1 week ago, no use of NSAIDS as it worsens her diarrhea. + vaginal discharge Chronic diarrhea- no change with levsbid, unable to make appt to GI Migraines- would like treatment she had in past for her migraines Depression/anxiety states her nerves are bad, similar to her father, "only thing that helps is klonopin three times a day" Due for fasting labs  Review of Systems  GEN- denies fatigue, fever, weight loss,weakness, recent illness HEENT- denies eye drainage, change in vision, nasal discharge, CVS- denies chest pain, palpitations RESP- denies SOB, cough, wheeze ABD- denies N/V, change in stools, abd pain GU- denies dysuria, hematuria, dribbling, incontinence MSK- + joint pain, muscle aches, injury Neuro- denies headache, dizziness, syncope, seizure activity      Objective:   Physical Exam   GEN- NAD, alert and oriented x3 HEENT- PERRL, EOMI, non injected sclera, pink conjunctiva, MMM, oropharynx clear Neck- supple, no thyromegaly Breast- normal symmetry, no nipple inversion,no nipple drainage, no nodules or lumps felt Nodes- no axillary nodes CVS- RRR, no murmur RESP-CTAB ABD-NABS,soft,mild TTP LLQ, no rebound, no guarding GU- normal external genitalia, vaginal mucosa pink and moist, cervix visualized no growth, + blood form os, + discharge, no CMT, no ovarian masses, very TTP palpation of left ovary, uterus normal size EXT- No edema Pulses- Radial, DP- 2+ Psych- not overly anxious or depressed appearing, good eye contact     Assessment & Plan:

## 2013-01-10 NOTE — Patient Instructions (Signed)
Ultrasound to be set up for the bleeding Referral to psychiatrist Bentyl for the diarrhea- referral to GI Imitrex for the migraine/Fiorcet Get labs done today F/U 4 months

## 2013-01-10 NOTE — Assessment & Plan Note (Signed)
Fiorcoet and Imitrex given, limited supply

## 2013-01-10 NOTE — Assessment & Plan Note (Signed)
PAP Smear Done Fasting labs

## 2013-01-10 NOTE — Assessment & Plan Note (Signed)
Chronic diarrhea, refer again to GI Given info on transportation Bentyl started

## 2013-01-10 NOTE — Assessment & Plan Note (Signed)
She has been on multiple meds in the past, many mood stabliziers, ? Diagnosis more bipolar, has been on depakote, lithium, SSRI, Buspar and benzos. Due to history when patient came to me with failed UDS and taking medication from family members I will not prescribe any benzo, she states this is the only thing that helps, defer to psychiatry

## 2013-01-11 ENCOUNTER — Telehealth: Payer: Self-pay | Admitting: Family Medicine

## 2013-01-11 NOTE — Telephone Encounter (Signed)
Patient is aware 

## 2013-01-13 MED ORDER — METRONIDAZOLE 500 MG PO TABS: 500.0000 mg | ORAL_TABLET | Freq: Two times a day (BID) | ORAL | Status: AC

## 2013-01-13 NOTE — Addendum Note (Signed)
Addended by: Milinda Antis F on: 01/13/2013 05:38 PM   Modules accepted: Orders

## 2013-01-17 ENCOUNTER — Telehealth: Payer: Self-pay | Admitting: Family Medicine

## 2013-01-17 ENCOUNTER — Ambulatory Visit (HOSPITAL_COMMUNITY): Payer: Medicaid Other

## 2013-01-17 ENCOUNTER — Ambulatory Visit (HOSPITAL_COMMUNITY)
Admission: RE | Admit: 2013-01-17 | Discharge: 2013-01-17 | Disposition: A | Discharge status: Home / Self Care | Payer: Medicaid Other | Source: Ambulatory Visit | Attending: Family Medicine | Admitting: Family Medicine

## 2013-01-17 DIAGNOSIS — N949 Unspecified condition associated with female genital organs and menstrual cycle: Secondary | ICD-10-CM | POA: Insufficient documentation

## 2013-01-17 DIAGNOSIS — R102 Pelvic and perineal pain: Secondary | ICD-10-CM

## 2013-01-17 DIAGNOSIS — N854 Malposition of uterus: Secondary | ICD-10-CM | POA: Insufficient documentation

## 2013-01-19 ENCOUNTER — Telehealth: Payer: Self-pay | Admitting: Family Medicine

## 2013-01-19 NOTE — Telephone Encounter (Signed)
Patient aware of results.   No further complaints of bleeding.

## 2013-01-19 NOTE — Telephone Encounter (Signed)
Patient aware of results.

## 2013-01-26 ENCOUNTER — Ambulatory Visit: Payer: Medicaid Other | Admitting: Gastroenterology

## 2013-02-25 ENCOUNTER — Telehealth: Payer: Self-pay | Admitting: Family Medicine

## 2013-02-25 MED ORDER — BUTALBITAL-APAP-CAFF-COD 50-325-40-30 MG PO CAPS: 1.0000 | ORAL_CAPSULE | ORAL | Status: DC | PRN

## 2013-02-25 NOTE — Telephone Encounter (Signed)
Imitrex makes her very "Loopy"  The Fioricet w/codiene works well and relieves headache.  Can she have refill of that?

## 2013-02-25 NOTE — Telephone Encounter (Signed)
Med called in.  Pt aware

## 2013-02-25 NOTE — Telephone Encounter (Signed)
Okay to refill fioret with 1 refill

## 2013-04-12 ENCOUNTER — Telehealth: Payer: Self-pay | Admitting: Family Medicine

## 2013-04-12 MED ORDER — BUTALBITAL-APAP-CAFF-COD 50-325-40-30 MG PO CAPS: 1.0000 | ORAL_CAPSULE | ORAL | Status: DC | PRN

## 2013-04-12 NOTE — Telephone Encounter (Signed)
Okay to refill, give total 3

## 2013-04-12 NOTE — Telephone Encounter (Signed)
?   Ok to refill, last refill 02/25/13

## 2013-04-12 NOTE — Telephone Encounter (Signed)
rx called in

## 2013-04-13 NOTE — Telephone Encounter (Signed)
error 

## 2013-04-27 ENCOUNTER — Telehealth: Payer: Self-pay | Admitting: Family Medicine

## 2013-04-27 NOTE — Telephone Encounter (Signed)
?   OK to Refill  

## 2013-04-27 NOTE — Telephone Encounter (Signed)
Fioricet/Codeine 50/325/40/30 mg cap 1 q4 hours prn headache #20  Pt got filled 04/13/13, 04/16/13, 04/19/13, and 04/23/13

## 2013-04-27 NOTE — Telephone Encounter (Signed)
She has had too many fills of this prn medication She needs to schedule appt

## 2013-04-28 NOTE — Telephone Encounter (Signed)
LMTRC

## 2013-04-29 NOTE — Telephone Encounter (Signed)
Informed pt that she needs an appt and she has made one for 05/11/13 but she has been going through a lot and has had to take more then usual. I mentioned that if she did not have a refill that she has taken 80 pills in less then a month and she said she had some left and it would last till her appt in September.

## 2013-05-11 ENCOUNTER — Ambulatory Visit: Payer: Self-pay | Admitting: Family Medicine

## 2013-05-18 ENCOUNTER — Ambulatory Visit (INDEPENDENT_AMBULATORY_CARE_PROVIDER_SITE_OTHER): Payer: Medicaid Other | Admitting: Family Medicine

## 2013-05-18 VITALS — BP 110/86 | HR 92 | Temp 98.4°F | Resp 20 | Wt 165.0 lb

## 2013-05-18 DIAGNOSIS — F341 Dysthymic disorder: Secondary | ICD-10-CM

## 2013-05-18 DIAGNOSIS — G43009 Migraine without aura, not intractable, without status migrainosus: Secondary | ICD-10-CM

## 2013-05-18 DIAGNOSIS — M545 Low back pain, unspecified: Secondary | ICD-10-CM

## 2013-05-18 DIAGNOSIS — J209 Acute bronchitis, unspecified: Secondary | ICD-10-CM

## 2013-05-18 DIAGNOSIS — F418 Other specified anxiety disorders: Secondary | ICD-10-CM

## 2013-05-18 MED ORDER — ALBUTEROL SULFATE HFA 108 (90 BASE) MCG/ACT IN AERS: 2.0000 | INHALATION_SPRAY | Freq: Four times a day (QID) | RESPIRATORY_TRACT | Status: DC | PRN

## 2013-05-18 MED ORDER — AZITHROMYCIN 250 MG PO TABS: ORAL_TABLET | ORAL | Status: DC

## 2013-05-18 MED ORDER — TRAMADOL HCL 50 MG PO TABS: 100.0000 mg | ORAL_TABLET | Freq: Four times a day (QID) | ORAL | Status: DC | PRN

## 2013-05-18 MED ORDER — VENLAFAXINE HCL 37.5 MG PO TABS: 37.5000 mg | ORAL_TABLET | Freq: Two times a day (BID) | ORAL | Status: DC

## 2013-05-18 MED ORDER — PROMETHAZINE HCL 12.5 MG PO TABS: 12.5000 mg | ORAL_TABLET | Freq: Three times a day (TID) | ORAL | Status: DC | PRN

## 2013-05-18 MED ORDER — BUTALBITAL-APAP-CAFF-COD 50-325-40-30 MG PO CAPS: 1.0000 | ORAL_CAPSULE | ORAL | Status: DC | PRN

## 2013-05-18 NOTE — Patient Instructions (Addendum)
For bronchitis- antibiotics and inhaler For migraines, Phenergan and Fioricet For depression/anxiety start effexor 1 tablet twice a day  Pain meds changed to 2 tablets three times a day F/U 6 weeks

## 2013-05-19 ENCOUNTER — Encounter: Payer: Self-pay | Admitting: Family Medicine

## 2013-05-19 DIAGNOSIS — J209 Acute bronchitis, unspecified: Secondary | ICD-10-CM | POA: Insufficient documentation

## 2013-05-19 NOTE — Progress Notes (Signed)
  Subjective:    Patient ID: [name], female    DOB: [birth year], 37 y.o.   MRN: [id number]  HPI  Patient here to followup medications. States that her ultram is wearing off is not strong enough for like to have an increase on the dose of medication. She understands I will not prescribe her narcotic medications . Migraine- she continues to have migraine headaches. They're worse because of all the stress she has in her life. States she feels anxious and depressed because of her living situation and how she and her child is treated. She is nowhere else ago. She denies any suicidal ideations or hallucinations. She feels she needs something to help keep her mood stable. She is unable to go to other appointments secondary to no transportation. She complains of cough with production for the past week her daughter also has an upper respiratory infection. She's also noticed some wheezing the past couple of days. She is exposed to tobacco smoke by her aunt and uncle whom she lives with. She denies any fever or shortness of breath.  Review of Systems  GEN- denies fatigue, fever, weight loss,weakness, recent illness HEENT- denies eye drainage, change in vision, nasal discharge, CVS- denies chest pain, palpitations RESP- denies SOB, +cough, +wheeze ABD- denies N/V, change in stools, abd pain MSK-+ joint pain, muscle aches, injury Neuro- + headache, dizziness, syncope, seizure activity      Objective:   Physical Exam GEN- NAD, alert and oriented x3 HEENT- PERRL, EOMI, non injected sclera, pink conjunctiva, MMM, oropharynx clear, TM clear bilat no effusion, no maxillary sinus tenderness,  Neck- Supple, no LAD CVS- RRR, no murmur RESP-mild rhonchi, bilateral wheeze, normal WOB, no retractions EXT- No edema Pulses- Radial 2+ Psych- normal affect and mood        Assessment & Plan:

## 2013-05-19 NOTE — Assessment & Plan Note (Signed)
I will allow her to take 100 mg 3 times a day tramadol no further increases

## 2013-05-19 NOTE — Assessment & Plan Note (Signed)
Treat with antibiotics and albuterol

## 2013-05-19 NOTE — Assessment & Plan Note (Addendum)
We will try her on Effexor twice a day. She has been on multiple medications in the past but is having difficulty getting to appointments.

## 2013-05-19 NOTE — Assessment & Plan Note (Signed)
Imitrex caused her to be sick. We will continue with Fioricet and Phenergan for nausea

## 2013-06-23 ENCOUNTER — Telehealth: Payer: Self-pay | Admitting: Family Medicine

## 2013-06-23 NOTE — Telephone Encounter (Signed)
Left message with mom to retuirn my call

## 2013-06-23 NOTE — Telephone Encounter (Signed)
Needs refill on headache medication-  Says the pharmacy lost one of her refills when she changed drugstores

## 2013-06-24 ENCOUNTER — Telehealth: Payer: Self-pay | Admitting: Family Medicine

## 2013-06-24 MED ORDER — BUTALBITAL-APAP-CAFF-COD 50-325-40-30 MG PO CAPS: 1.0000 | ORAL_CAPSULE | ORAL | Status: DC | PRN

## 2013-06-24 MED ORDER — CYCLOBENZAPRINE HCL 10 MG PO TABS: 10.0000 mg | ORAL_TABLET | Freq: Two times a day (BID) | ORAL | Status: DC | PRN

## 2013-06-24 NOTE — Telephone Encounter (Signed)
Needs refill on Fiorecet and Flexeril, pt states that pharmacy lost her prescription during transfer from another pharmacy for the fiorcet(i called pharmacy it was lost). Can we refill again, her last refill for the fiorcet was 06/10/13.

## 2013-06-24 NOTE — Telephone Encounter (Signed)
Okay to refill these?

## 2013-06-24 NOTE — Telephone Encounter (Signed)
Med phoned in and pt aware

## 2013-06-24 NOTE — Telephone Encounter (Signed)
Pt called back and will take care of request

## 2013-07-05 ENCOUNTER — Ambulatory Visit: Payer: Medicaid Other | Admitting: Family Medicine

## 2013-07-21 ENCOUNTER — Encounter: Payer: Self-pay | Admitting: Physician Assistant

## 2013-07-21 ENCOUNTER — Ambulatory Visit (INDEPENDENT_AMBULATORY_CARE_PROVIDER_SITE_OTHER): Payer: Medicaid Other | Admitting: Physician Assistant

## 2013-07-21 ENCOUNTER — Telehealth: Payer: Self-pay | Admitting: Family Medicine

## 2013-07-21 VITALS — BP 120/90 | HR 100 | Temp 98.6°F | Resp 16 | Wt 163.0 lb

## 2013-07-21 DIAGNOSIS — N920 Excessive and frequent menstruation with regular cycle: Secondary | ICD-10-CM

## 2013-07-21 DIAGNOSIS — R6889 Other general symptoms and signs: Secondary | ICD-10-CM

## 2013-07-21 DIAGNOSIS — N946 Dysmenorrhea, unspecified: Secondary | ICD-10-CM

## 2013-07-21 DIAGNOSIS — N921 Excessive and frequent menstruation with irregular cycle: Secondary | ICD-10-CM

## 2013-07-21 LAB — CBC WITH DIFFERENTIAL/PLATELET
Basophils Absolute: 0 10*3/uL (ref 0.0–0.1)
HCT: 41.1 % (ref 36.0–46.0)
Lymphocytes Relative: 27 % (ref 12–46)
Lymphs Abs: 1.6 10*3/uL (ref 0.7–4.0)
Monocytes Absolute: 0.4 10*3/uL (ref 0.1–1.0)
Neutro Abs: 3.8 10*3/uL (ref 1.7–7.7)
Platelets: 250 10*3/uL (ref 150–400)
RBC: 4.61 MIL/uL (ref 3.87–5.11)
RDW: 13.9 % (ref 11.5–15.5)
WBC: 5.8 10*3/uL (ref 4.0–10.5)

## 2013-07-21 LAB — HEMOGLOBIN, FINGERSTICK: Hemoglobin, fingerstick: 14.2 g/dL (ref 12.0–16.0)

## 2013-07-21 MED ORDER — HYDROCODONE-ACETAMINOPHEN 5-325 MG PO TABS: 1.0000 | ORAL_TABLET | Freq: Four times a day (QID) | ORAL | Status: DC | PRN

## 2013-07-21 NOTE — Telephone Encounter (Signed)
Patient needs her Fioricet refilled

## 2013-07-21 NOTE — Telephone Encounter (Signed)
Per MBD pt was given Hydrocodone at ov and she can take that for her HA's. .Patient aware

## 2013-07-22 NOTE — Progress Notes (Signed)
Patient ID: [name] MRN: [id number], DOB: [birth year], 37 y.o. Date of Encounter: @DATE @  Chief Complaint:  Chief Complaint  Patient presents with  . Heavy periods that last for extended amount of time    HPI: 37 y.o. year old white female  presents with complaints as below:  She reports that in the past her menstrual cycle has always been very regular. As well they would usually last about 4 days. She said that the first 2 days would be fairly heavy then the flow would decrease significantly. She would have some mild diffuse cramping.  However she says that over the last month she has been having the following changes: Actually she says she's not sure exactly when they began to change. She thinks maybe sometime over this past summer. She says since then one period would be  abnormal and then she may have one that is lighter and more normal. She can recall 2 different menstrual cycles when she had significant symptoms.  She says with one of these she was out mowing the yard using a push mower. She developed significant pain in her left lower quadrant and then developed nausea and even vomited once. She went inside and laid down. She then went to the bathroom and noted vaginal bleeding which was the beginning of her menses. She says she continued to have bleeding for 4 or 5 days.  She says that she had similar symptoms when she started this current menstrual cycle. She says that this past Tuesday, which was 2 days ago, she was vacuuming the hardwood floor -- which was not very strenuous. She again developed severe pain in the left lower quadrant and nausea but she did not have any vomiting with this episode. She went to the bathroom and had started vaginal bleeding for the beginning of this menstrual cycle. As well she states that during these menstrual cycles recently she has been seeing large clumps and clots. She says that sometimes they are so large she can even feel them coming  out.  She also complains of feeling cold all the time.  She says that she had her last pelvic exam and Pap smear were by Dr. Jeanice Lim in April. In the past she did see Dr.Buist in [facility] as her gynecologist.   Past Medical History  Diagnosis Date  . Migraine   . Neck fracture     C1  . DJD (degenerative joint disease), lumbar   . Lymph node abscess     s/p removal  . Ovarian cyst     Followed by GYN Dr. Ralph Dowdy  . Anxiety   . Depression   . Seizures      Home Meds: See attached medication section for current medication list. Any medications entered into computer today will not appear on this note's list. The medications listed below were entered prior to today. Current Outpatient Prescriptions on File Prior to Visit  Medication Sig Dispense Refill  . albuterol (PROVENTIL HFA;VENTOLIN HFA) 108 (90 BASE) MCG/ACT inhaler Inhale 2 puffs into the lungs every 6 (six) hours as needed for wheezing.  1 Inhaler  2  . butalbital-acetaminophen-caffeine (FIORICET WITH CODEINE) 50-325-40-30 MG per capsule Take 1 capsule by mouth every 4 (four) hours as needed. For headache  20 capsule  3  . cyclobenzaprine (FLEXERIL) 10 MG tablet Take 1 tablet (10 mg total) by mouth 2 (two) times daily as needed for muscle spasms.  60 tablet  3  . dicyclomine (BENTYL) 10 MG capsule  Take 1 capsule (10 mg total) by mouth 4 (four) times daily as needed.  90 capsule  3  . promethazine (PHENERGAN) 25 MG tablet Take 1 tablet (25 mg total) by mouth every 6 (six) hours as needed for nausea.  8 tablet  0  . traMADol (ULTRAM) 50 MG tablet Take 2 tablets (100 mg total) by mouth every 6 (six) hours as needed for pain. 2 tablets three times a day as needed  180 tablet  2  . venlafaxine (EFFEXOR) 37.5 MG tablet Take 1 tablet (37.5 mg total) by mouth 2 (two) times daily.  60 tablet  3   No current facility-administered medications on file prior to visit.    Allergies:  Allergies  Allergen Reactions  . Darvocet  [Propoxyphene-Acetaminophen]   . Penicillins     REACTION: Not sure - mom told her she was allergic    History   Social History  . Marital Status: Married    Spouse Name: N/A    Number of Children: N/A  . Years of Education: N/A   Occupational History  . Not on file.   Social History Main Topics  . Smoking status: Former Games developer  . Smokeless tobacco: Not on file  . Alcohol Use: Yes     Comment: rarely  . Drug Use: No  . Sexual Activity: Yes    Birth Control/ Protection: Surgical   Other Topics Concern  . Not on file   Social History Narrative  . No narrative on file    Family History  Problem Relation Age of Onset  . Heart disease Father     MI  . Depression Father   . Hyperlipidemia Mother   . Heart disease Maternal Grandfather   . Hyperlipidemia Maternal Grandfather   . Cancer Maternal Grandfather     Colon cancer     Review of Systems:  See HPI for pertinent ROS. All other ROS negative.    Physical Exam: Blood pressure 120/90, pulse 100, temperature 98.6 F (37 C), temperature source Oral, resp. rate 16, weight 163 lb (73.936 kg)., Body mass index is 25.52 kg/(m^2). General: WNWD WF. Appears in no acute distress. Lungs: Clear bilaterally to auscultation without wheezes, rales, or rhonchi. Breathing is unlabored. Heart: RRR with S1 S2. No murmurs, rubs, or gallops. Abdomen: Soft, non-tender, non-distended with normoactive bowel sounds. No hepatomegaly. No rebound/guarding. No obvious abdominal masses. Musculoskeletal:  Strength and tone normal for age. Extremities/Skin: Warm and dry. No clubbing or cyanosis. No edema. No rashes or suspicious lesions. Neuro: Alert and oriented X 3. Moves all extremities spontaneously. Gait is normal. CNII-XII grossly in tact. Psych:  Responds to questions appropriately with a normal affect.     ASSESSMENT AND PLAN:  37 y.o. year old female with  1. Heavy periods - Hemoglobin, fingerstick  2. Metrorrhagia - CBC with  Differential - Ambulatory referral to Gynecology  3. Dysmenorrhea - CBC with Differential - Ambulatory referral to Gynecology - HYDROcodone-acetaminophen (NORCO/VICODIN) 5-325 MG per tablet; Take 1 tablet by mouth every 6 (six) hours as needed.  Dispense: 30 tablet; Refill: 0  4. Cold intolerance - TSH  She is requesting medication to use for pain. Says the tramadol and ibuprofen do not work. I will check lab. I will refer her to GYN for further evaluation. She will probably need an ultrasound but may also need endometrial biopsy. Will just refer her to Gyn and let them perform any necessary tests.    Signed, Shon Hale [place], [place], [place]  07/22/2013 11:33 AM

## 2013-07-27 ENCOUNTER — Encounter (HOSPITAL_COMMUNITY): Payer: Self-pay | Admitting: Emergency Medicine

## 2013-07-27 ENCOUNTER — Emergency Department (HOSPITAL_COMMUNITY)
Admission: EM | Admit: 2013-07-27 | Discharge: 2013-07-27 | Disposition: A | Discharge status: Home / Self Care | Payer: Medicaid Other | Attending: Emergency Medicine | Admitting: Emergency Medicine

## 2013-07-27 ENCOUNTER — Emergency Department (HOSPITAL_COMMUNITY): Payer: Medicaid Other

## 2013-07-27 DIAGNOSIS — Z9851 Tubal ligation status: Secondary | ICD-10-CM | POA: Insufficient documentation

## 2013-07-27 DIAGNOSIS — Z87442 Personal history of urinary calculi: Secondary | ICD-10-CM | POA: Insufficient documentation

## 2013-07-27 DIAGNOSIS — F329 Major depressive disorder, single episode, unspecified: Secondary | ICD-10-CM | POA: Insufficient documentation

## 2013-07-27 DIAGNOSIS — Z880 Allergy status to penicillin: Secondary | ICD-10-CM | POA: Insufficient documentation

## 2013-07-27 DIAGNOSIS — Z87891 Personal history of nicotine dependence: Secondary | ICD-10-CM | POA: Insufficient documentation

## 2013-07-27 DIAGNOSIS — Z8781 Personal history of (healed) traumatic fracture: Secondary | ICD-10-CM | POA: Insufficient documentation

## 2013-07-27 DIAGNOSIS — Z8679 Personal history of other diseases of the circulatory system: Secondary | ICD-10-CM | POA: Insufficient documentation

## 2013-07-27 DIAGNOSIS — F3289 Other specified depressive episodes: Secondary | ICD-10-CM | POA: Insufficient documentation

## 2013-07-27 DIAGNOSIS — Z8742 Personal history of other diseases of the female genital tract: Secondary | ICD-10-CM | POA: Insufficient documentation

## 2013-07-27 DIAGNOSIS — Z8739 Personal history of other diseases of the musculoskeletal system and connective tissue: Secondary | ICD-10-CM | POA: Insufficient documentation

## 2013-07-27 DIAGNOSIS — Z3202 Encounter for pregnancy test, result negative: Secondary | ICD-10-CM | POA: Insufficient documentation

## 2013-07-27 DIAGNOSIS — Z79899 Other long term (current) drug therapy: Secondary | ICD-10-CM | POA: Insufficient documentation

## 2013-07-27 DIAGNOSIS — F411 Generalized anxiety disorder: Secondary | ICD-10-CM | POA: Insufficient documentation

## 2013-07-27 DIAGNOSIS — Z872 Personal history of diseases of the skin and subcutaneous tissue: Secondary | ICD-10-CM | POA: Insufficient documentation

## 2013-07-27 DIAGNOSIS — Z8669 Personal history of other diseases of the nervous system and sense organs: Secondary | ICD-10-CM | POA: Insufficient documentation

## 2013-07-27 DIAGNOSIS — R109 Unspecified abdominal pain: Secondary | ICD-10-CM | POA: Insufficient documentation

## 2013-07-27 DIAGNOSIS — Z9889 Other specified postprocedural states: Secondary | ICD-10-CM | POA: Insufficient documentation

## 2013-07-27 LAB — URINALYSIS, ROUTINE W REFLEX MICROSCOPIC
Glucose, UA: NEGATIVE mg/dL
Ketones, ur: NEGATIVE mg/dL
Leukocytes, UA: NEGATIVE
Specific Gravity, Urine: >1.03 — ABNORMAL HIGH (ref 1.005–1.030)
Urobilinogen, UA: 0.2 mg/dL (ref 0.0–1.0)
pH: 5 (ref 5.0–8.0)

## 2013-07-27 LAB — URINE MICROSCOPIC-ADD ON

## 2013-07-27 LAB — PREGNANCY, URINE: Preg Test, Ur: NEGATIVE

## 2013-07-27 MED ORDER — OXYCODONE-ACETAMINOPHEN 5-325 MG PO TABS
1.0000 | ORAL_TABLET | Freq: Once | ORAL | Status: AC
  Administered 2013-07-27: 1 via ORAL
  Filled 2013-07-27: qty 1

## 2013-07-27 MED ORDER — HYDROMORPHONE HCL PF 1 MG/ML IJ SOLN
0.5000 mg | Freq: Once | INTRAMUSCULAR | Status: AC
  Administered 2013-07-27: 0.5 mg via INTRAVENOUS
  Filled 2013-07-27: qty 1

## 2013-07-27 MED ORDER — ONDANSETRON HCL 4 MG/2ML IJ SOLN
4.0000 mg | Freq: Once | INTRAMUSCULAR | Status: AC
  Administered 2013-07-27: 4 mg via INTRAVENOUS
  Filled 2013-07-27: qty 2

## 2013-07-27 MED ORDER — KETOROLAC TROMETHAMINE 30 MG/ML IJ SOLN
30.0000 mg | Freq: Once | INTRAMUSCULAR | Status: AC
  Administered 2013-07-27: 30 mg via INTRAVENOUS
  Filled 2013-07-27: qty 1

## 2013-07-27 MED ORDER — PROMETHAZINE HCL 25 MG PO TABS: 25.0000 mg | ORAL_TABLET | Freq: Four times a day (QID) | ORAL | Status: DC | PRN

## 2013-07-27 MED ORDER — SODIUM CHLORIDE 0.9 % IV BOLUS (SEPSIS)
500.0000 mL | Freq: Once | INTRAVENOUS | Status: AC
  Administered 2013-07-27: 500 mL via INTRAVENOUS

## 2013-07-27 MED ORDER — TRAMADOL HCL 50 MG PO TABS: 50.0000 mg | ORAL_TABLET | Freq: Four times a day (QID) | ORAL | Status: DC | PRN

## 2013-07-27 NOTE — ED Provider Notes (Signed)
CSN: [id number]     Arrival date & time 07/27/13  4098 History  This chart was scribed for Donnetta Hutching, MD by Valera Castle, ED Scribe. This patient was seen in room APA07/APA07 and the patient's care was started at 7:41 AM.    Chief Complaint  Patient presents with  . Flank Pain    (Consider location/radiation/quality/duration/timing/severity/associated sxs/prior Treatment)  The history is provided by the patient. No language interpreter was used.   HPI Comments: [name] is a 37 y.o. female who presents to the Emergency Department complaining of sudden, moderate, waxing and waning, left sided flank pain that radiates to her LLQ, onset 8 days ago. She reports severe pain in the mornings, with associated and trouble urinating, stating that it feels extremely uncomfortable to urinate. She also reports emesis. She reports a h/o 4 kidney stones. She states she went to see her PCP who told her she may have a cyst on her ovary. She reports that since then the symptoms have continued to worsen. She denies hematuria, cold symptoms, and any other associated symptoms. She denies any other complaint.  PCP - Milinda Antis, MD   Past Medical History  Diagnosis Date  . Migraine   . Neck fracture     C1  . DJD (degenerative joint disease), lumbar   . Lymph node abscess     s/p removal  . Ovarian cyst     Followed by GYN Dr. Ralph Dowdy  . Anxiety   . Depression   . Seizures    Past Surgical History  Procedure Laterality Date  . Back surgery    . Tonsillectomy    . Tubal ligation    . Colposcopy      CIN-1 Dr. Ralph Dowdy (GYN)/ HPV   Family History  Problem Relation Age of Onset  . Heart disease Father     MI  . Depression Father   . Hyperlipidemia Mother   . Heart disease Maternal Grandfather   . Hyperlipidemia Maternal Grandfather   . Cancer Maternal Grandfather     Colon cancer   History  Substance Use Topics  . Smoking status: Former Games developer  . Smokeless tobacco: Not on file  .  Alcohol Use: Yes     Comment: rarely   OB History   Grav Para Term Preterm Abortions TAB SAB Ect Mult Living                 Review of Systems A complete 10 system review of systems was obtained and all systems are negative except as noted in the HPI and PMH.   Allergies  Darvocet and Penicillins  Home Medications   Current Outpatient Rx  Name  Route  Sig  Dispense  Refill  . acetaminophen (TYLENOL) 500 MG tablet   Oral   Take 1,000 mg by mouth every 6 (six) hours as needed for mild pain or moderate pain.         Marland Kitchen albuterol (PROVENTIL HFA;VENTOLIN HFA) 108 (90 BASE) MCG/ACT inhaler   Inhalation   Inhale 2 puffs into the lungs every 6 (six) hours as needed for wheezing.   1 Inhaler   2   . butalbital-acetaminophen-caffeine (FIORICET WITH CODEINE) 50-325-40-30 MG per capsule   Oral   Take 1 capsule by mouth every 4 (four) hours as needed. For headache   20 capsule   3   . cyclobenzaprine (FLEXERIL) 10 MG tablet   Oral   Take 1 tablet (10 mg total) by mouth 2 (  two) times daily as needed for muscle spasms.   60 tablet   3   . promethazine (PHENERGAN) 25 MG tablet   Oral   Take 1 tablet (25 mg total) by mouth every 6 (six) hours as needed for nausea.   8 tablet   0   . traMADol (ULTRAM) 50 MG tablet   Oral   Take 100 mg by mouth 3 (three) times daily.          Triage Vitals: BP 155/90  Pulse 90  Temp(Src) 98.8 F (37.1 C) (Oral)  Resp 18  Ht 5' 6.5" (1.689 m)  Wt 163 lb (73.936 kg)  BMI 25.92 kg/m2  SpO2 98%  LMP 07/19/2013  Physical Exam  Nursing note and vitals reviewed. Constitutional: She is oriented to person, place, and time. She appears well-developed and well-nourished.  HENT:  Head: Normocephalic and atraumatic.  Eyes: Conjunctivae and EOM are normal. Pupils are equal, round, and reactive to light.  Neck: Normal range of motion. Neck supple.  Cardiovascular: Normal rate, regular rhythm and normal heart sounds.   Pulmonary/Chest: Effort  normal and breath sounds normal.  Abdominal: Soft. Bowel sounds are normal. There is tenderness.  Tenderness to left flank that radiates to LLQ.  Genitourinary:  Normal genitalia  Musculoskeletal: Normal range of motion.  Neurological: She is alert and oriented to person, place, and time.  Skin: Skin is warm and dry.  Pink color  Psychiatric: She has a normal mood and affect. Her behavior is normal.    ED Course  Procedures (including critical care time)  DIAGNOSTIC STUDIES: Oxygen Saturation is 98% on room air, normal by my interpretation.    COORDINATION OF CARE: 7:46 AM-Discussed treatment plan which includes IV fluids, pain medication, UA, pregnancy test, and CT abdomen with pt at bedside and pt agreed to plan.   Labs Review Labs Reviewed  URINALYSIS, ROUTINE W REFLEX MICROSCOPIC - Abnormal; Notable for the following:    Specific Gravity, Urine >1.030 (*)    Bilirubin Urine SMALL (*)    Protein, ur TRACE (*)    All other components within normal limits  URINE MICROSCOPIC-ADD ON - Abnormal; Notable for the following:    Squamous Epithelial / LPF FEW (*)    Bacteria, UA FEW (*)    All other components within normal limits  PREGNANCY, URINE   Imaging Review Ct Abdomen Pelvis Wo Contrast  07/27/2013   CLINICAL DATA:  Left flank pain  EXAM: CT ABDOMEN AND PELVIS WITHOUT CONTRAST  TECHNIQUE: Multidetector CT imaging of the abdomen and pelvis was performed following the standard protocol without intravenous contrast.  COMPARISON:  CT 02/13/2012  FINDINGS: 1 millimeter nonobstructing stone left upper pole. No other renal calculi. No hydronephrosis or ureteral calculus. The bladder is normal.  Lung bases are clear. Unenhanced images of the liver and gallbladder are normal. Pancreas and spleen are negative.  Negative for bowel obstruction or bowel thickening. Appendix is normal. Fallopian tube clips are noted bilaterally. Negative for mass or adenopathy.  IMPRESSION: 1 millimeter  nonobstructing stone left upper pole. No acute abnormality.   Electronically Signed   By: Marlan Palau M.D.   On: 07/27/2013 10:04    EKG Interpretation   None      Results for orders placed during the hospital encounter of 07/27/13  URINALYSIS, ROUTINE W REFLEX MICROSCOPIC      Result Value Range   Color, Urine YELLOW  YELLOW   APPearance CLEAR  CLEAR   Specific Gravity, Urine >  1.030 (*) 1.005 - 1.030   pH 5.0  5.0 - 8.0   Glucose, UA NEGATIVE  NEGATIVE mg/dL   Hgb urine dipstick NEGATIVE  NEGATIVE   Bilirubin Urine SMALL (*) NEGATIVE   Ketones, ur NEGATIVE  NEGATIVE mg/dL   Protein, ur TRACE (*) NEGATIVE mg/dL   Urobilinogen, UA 0.2  0.0 - 1.0 mg/dL   Nitrite NEGATIVE  NEGATIVE   Leukocytes, UA NEGATIVE  NEGATIVE  PREGNANCY, URINE      Result Value Range   Preg Test, Ur NEGATIVE  NEGATIVE  URINE MICROSCOPIC-ADD ON      Result Value Range   Squamous Epithelial / LPF FEW (*) RARE   WBC, UA 0-2  <3 WBC/hpf   RBC / HPF 0-2  <3 RBC/hpf   Bacteria, UA FEW (*) RARE    MDM  No diagnosis found. No acute abdomen. Urinalysis negative for hemoglobin. CT scan shows no ureteral stone.  Discharge medications Ultram and Phenergan 25 mg    I personally performed the services described in this documentation, which was scribed in my presence. The recorded information has been reviewed and is accurate.    Donnetta Hutching, MD 07/27/13 1247

## 2013-07-27 NOTE — ED Notes (Addendum)
Pt c/o pain in left flank radiating around to left side of abd since last Tuesday.  Reports history of kidney stones.  Pt says pain more severe today and vomited x 5.

## 2013-07-27 NOTE — Care Management (Signed)
Please disregard CM consult for this pt entered in error

## 2013-07-29 ENCOUNTER — Telehealth: Payer: Self-pay | Admitting: *Deleted

## 2013-07-29 ENCOUNTER — Encounter (HOSPITAL_COMMUNITY): Payer: Self-pay | Admitting: Emergency Medicine

## 2013-07-29 ENCOUNTER — Telehealth: Payer: Self-pay | Admitting: Family Medicine

## 2013-07-29 ENCOUNTER — Inpatient Hospital Stay (HOSPITAL_COMMUNITY)
Admission: EM | Admit: 2013-07-29 | Discharge: 2013-07-31 | DRG: 392 | Disposition: A | Discharge status: Home / Self Care | Payer: Medicaid Other | Attending: Internal Medicine | Admitting: Internal Medicine

## 2013-07-29 DIAGNOSIS — F411 Generalized anxiety disorder: Secondary | ICD-10-CM | POA: Diagnosis present

## 2013-07-29 DIAGNOSIS — M5137 Other intervertebral disc degeneration, lumbosacral region: Secondary | ICD-10-CM | POA: Diagnosis present

## 2013-07-29 DIAGNOSIS — M51379 Other intervertebral disc degeneration, lumbosacral region without mention of lumbar back pain or lower extremity pain: Secondary | ICD-10-CM | POA: Diagnosis present

## 2013-07-29 DIAGNOSIS — K829 Disease of gallbladder, unspecified: Secondary | ICD-10-CM

## 2013-07-29 DIAGNOSIS — N201 Calculus of ureter: Secondary | ICD-10-CM | POA: Diagnosis present

## 2013-07-29 DIAGNOSIS — Z23 Encounter for immunization: Secondary | ICD-10-CM

## 2013-07-29 DIAGNOSIS — E669 Obesity, unspecified: Secondary | ICD-10-CM

## 2013-07-29 DIAGNOSIS — N200 Calculus of kidney: Secondary | ICD-10-CM | POA: Diagnosis present

## 2013-07-29 DIAGNOSIS — M47817 Spondylosis without myelopathy or radiculopathy, lumbosacral region: Secondary | ICD-10-CM | POA: Diagnosis present

## 2013-07-29 DIAGNOSIS — R748 Abnormal levels of other serum enzymes: Secondary | ICD-10-CM

## 2013-07-29 DIAGNOSIS — R1011 Right upper quadrant pain: Principal | ICD-10-CM | POA: Diagnosis present

## 2013-07-29 DIAGNOSIS — R112 Nausea with vomiting, unspecified: Secondary | ICD-10-CM

## 2013-07-29 DIAGNOSIS — G43009 Migraine without aura, not intractable, without status migrainosus: Secondary | ICD-10-CM | POA: Diagnosis present

## 2013-07-29 DIAGNOSIS — R7402 Elevation of levels of lactic acid dehydrogenase (LDH): Secondary | ICD-10-CM | POA: Diagnosis present

## 2013-07-29 DIAGNOSIS — Z800 Family history of malignant neoplasm of digestive organs: Secondary | ICD-10-CM

## 2013-07-29 DIAGNOSIS — R109 Unspecified abdominal pain: Secondary | ICD-10-CM

## 2013-07-29 DIAGNOSIS — G40909 Epilepsy, unspecified, not intractable, without status epilepticus: Secondary | ICD-10-CM | POA: Diagnosis present

## 2013-07-29 DIAGNOSIS — R7401 Elevation of levels of liver transaminase levels: Secondary | ICD-10-CM

## 2013-07-29 DIAGNOSIS — Z87891 Personal history of nicotine dependence: Secondary | ICD-10-CM

## 2013-07-29 DIAGNOSIS — F419 Anxiety disorder, unspecified: Secondary | ICD-10-CM

## 2013-07-29 DIAGNOSIS — Z8249 Family history of ischemic heart disease and other diseases of the circulatory system: Secondary | ICD-10-CM

## 2013-07-29 DIAGNOSIS — F3289 Other specified depressive episodes: Secondary | ICD-10-CM | POA: Diagnosis present

## 2013-07-29 DIAGNOSIS — F329 Major depressive disorder, single episode, unspecified: Secondary | ICD-10-CM | POA: Diagnosis present

## 2013-07-29 DIAGNOSIS — E876 Hypokalemia: Secondary | ICD-10-CM

## 2013-07-29 LAB — CBC WITH DIFFERENTIAL/PLATELET
Basophils Absolute: 0 10*3/uL (ref 0.0–0.1)
Eosinophils Absolute: 0.1 10*3/uL (ref 0.0–0.7)
Eosinophils Relative: 2 % (ref 0–5)
HCT: 38.2 % (ref 36.0–46.0)
Hemoglobin: 13.2 g/dL (ref 12.0–15.0)
Lymphocytes Relative: 37 % (ref 12–46)
Lymphs Abs: 2 10*3/uL (ref 0.7–4.0)
MCV: 90.1 fL (ref 78.0–100.0)
Monocytes Absolute: 0.5 10*3/uL (ref 0.1–1.0)
Monocytes Relative: 9 % (ref 3–12)
RBC: 4.24 MIL/uL (ref 3.87–5.11)
RDW: 13.6 % (ref 11.5–15.5)
WBC: 5.2 10*3/uL (ref 4.0–10.5)

## 2013-07-29 LAB — COMPREHENSIVE METABOLIC PANEL
AST: 175 U/L — ABNORMAL HIGH (ref 0–37)
BUN: 8 mg/dL (ref 6–23)
CO2: 29 mEq/L (ref 19–32)
Calcium: 9.1 mg/dL (ref 8.4–10.5)
Chloride: 101 mEq/L (ref 96–112)
Creatinine, Ser: 0.74 mg/dL (ref 0.50–1.10)
GFR calc Af Amer: >90 mL/min (ref >90)
GFR calc non Af Amer: >90 mL/min (ref >90)
Glucose, Bld: 93 mg/dL (ref 70–99)
Total Bilirubin: 0.5 mg/dL (ref 0.3–1.2)
Total Protein: 6.4 g/dL (ref 6.0–8.3)

## 2013-07-29 LAB — URINALYSIS, ROUTINE W REFLEX MICROSCOPIC
Bilirubin Urine: NEGATIVE
Ketones, ur: NEGATIVE mg/dL
Protein, ur: NEGATIVE mg/dL
Urobilinogen, UA: 0.2 mg/dL (ref 0.0–1.0)
pH: 6 (ref 5.0–8.0)

## 2013-07-29 LAB — WET PREP, GENITAL
Clue Cells Wet Prep HPF POC: NONE SEEN
Yeast Wet Prep HPF POC: NONE SEEN

## 2013-07-29 LAB — PREGNANCY, URINE: Preg Test, Ur: NEGATIVE

## 2013-07-29 LAB — URINE MICROSCOPIC-ADD ON

## 2013-07-29 MED ORDER — HYDROMORPHONE HCL PF 1 MG/ML IJ SOLN
1.0000 mg | Freq: Once | INTRAMUSCULAR | Status: AC
  Administered 2013-07-29: 1 mg via INTRAVENOUS
  Filled 2013-07-29: qty 1

## 2013-07-29 MED ORDER — MORPHINE SULFATE 4 MG/ML IJ SOLN
4.0000 mg | Freq: Once | INTRAMUSCULAR | Status: AC
  Administered 2013-07-29: 4 mg via INTRAVENOUS
  Filled 2013-07-29: qty 1

## 2013-07-29 MED ORDER — ONDANSETRON HCL 4 MG/2ML IJ SOLN
4.0000 mg | Freq: Once | INTRAMUSCULAR | Status: AC
  Administered 2013-07-29: 4 mg via INTRAVENOUS
  Filled 2013-07-29: qty 2

## 2013-07-29 NOTE — ED Provider Notes (Addendum)
Pt has had left upper abd pain with nausea and vomiting, also pain in LLQ. Pt reports strong FH of gallbladder disease. Pt is very tender to palpation in her epigastric and RUQ. She is not on cholesterol meds, she does not drink alcohol.   Limited gallbladder US done at bedside, gallbladder wall is thickened, no obvious stone, unable to assess length of gallbladder.   Pt has marked elevation of her LFT's compared to normal test 1 year ago.   23:32 Dr Sharl Ma admit to med-surg, team 2   Plan admission   Medical screening examination/treatment/procedure(s) were conducted as a shared visit with non-physician practitioner(s) and myself.  I personally evaluated the patient during the encounter.  EKG Interpretation   None        Devoria Albe, MD, Armando Gang   Ward Givens, MD 07/29/13 1610  Ward Givens, MD 07/29/13 2332

## 2013-07-29 NOTE — ED Provider Notes (Signed)
CSN: [id number]     Arrival date & time 07/29/13  1615 History   First MD Initiated Contact with Patient 07/29/13 1853     Chief Complaint  Patient presents with  . Flank Pain  . Emesis   (Consider location/radiation/quality/duration/timing/severity/associated sxs/prior Treatment) HPI Comments: [name] is a 37 y.o. Female presenting for re-evaluation of her left flank and left abdominal pain which started gradually 3 days ago and has been accompanied by nausea with vomiting and low grade fevers. She has a history of kidney stones and was seen here 2 days ago at which time a noncontrast Ct scan showed a 1 mm left renal stone,  But no ureteral stones.  She now has lessened but still present flank pain but is very tender in the left lower quadrant with intermittent stabs of nonradiating pain in her epigastric area.  She reports daily loose stool which is her baseline and unchanged this week.  She also reports increased sensation of needed to urinate with a darker than normal urine.  She denies chest pain, cough, shortness of breath and rash. She is taking phenergan and tramadol for pain and nausea.       The history is provided by the patient.    Past Medical History  Diagnosis Date  . Migraine   . Neck fracture     C1  . DJD (degenerative joint disease), lumbar   . Lymph node abscess     s/p removal  . Ovarian cyst     Followed by GYN Dr. Ralph Dowdy  . Anxiety   . Depression   . Seizures    Past Surgical History  Procedure Laterality Date  . Back surgery    . Tonsillectomy    . Tubal ligation    . Colposcopy      CIN-1 Dr. Ralph Dowdy (GYN)/ HPV   Family History  Problem Relation Age of Onset  . Heart disease Father     MI  . Depression Father   . Hyperlipidemia Mother   . Heart disease Maternal Grandfather   . Hyperlipidemia Maternal Grandfather   . Cancer Maternal Grandfather     Colon cancer   History  Substance Use Topics  . Smoking status: Former Games developer  .  Smokeless tobacco: Not on file  . Alcohol Use: Yes     Comment: rarely   OB History   Grav Para Term Preterm Abortions TAB SAB Ect Mult Living                 Review of Systems  Constitutional: Positive for fever and chills.  HENT: Negative for congestion and sore throat.   Eyes: Negative.   Respiratory: Negative for chest tightness and shortness of breath.   Cardiovascular: Negative for chest pain.  Gastrointestinal: Positive for nausea, vomiting and abdominal pain.  Genitourinary: Positive for dysuria and flank pain. Negative for vaginal discharge and vaginal pain.  Musculoskeletal: Negative for arthralgias, joint swelling and neck pain.  Skin: Negative.  Negative for rash and wound.  Neurological: Negative for dizziness, weakness, light-headedness, numbness and headaches.  Psychiatric/Behavioral: Negative.     Allergies  Darvocet and Penicillins  Home Medications   Current Outpatient Rx  Name  Route  Sig  Dispense  Refill  . acetaminophen (TYLENOL) 500 MG tablet   Oral   Take 1,000 mg by mouth every 6 (six) hours as needed for mild pain or moderate pain.         . cyclobenzaprine (FLEXERIL) 10 MG  tablet   Oral   Take 1 tablet (10 mg total) by mouth 2 (two) times daily as needed for muscle spasms.   60 tablet   3   . promethazine (PHENERGAN) 25 MG tablet   Oral   Take 1 tablet (25 mg total) by mouth every 6 (six) hours as needed for nausea or vomiting.   15 tablet   0   . traMADol (ULTRAM) 50 MG tablet   Oral   Take 100 mg by mouth 3 (three) times daily.         Marland Kitchen albuterol (PROVENTIL HFA;VENTOLIN HFA) 108 (90 BASE) MCG/ACT inhaler   Inhalation   Inhale 2 puffs into the lungs every 6 (six) hours as needed for wheezing.   1 Inhaler   2    BP 142/90  Pulse 80  Temp(Src) 98.8 F (37.1 C) (Oral)  Resp 18  Ht 5\' 6"  (1.676 m)  Wt 160 lb (72.576 kg)  BMI 25.84 kg/m2  SpO2 94%  LMP 07/19/2013 Physical Exam  Nursing note and vitals  reviewed. Constitutional: She appears well-developed and well-nourished.  HENT:  Head: Normocephalic and atraumatic.  Eyes: Conjunctivae are normal.  Neck: Normal range of motion.  Cardiovascular: Normal rate, regular rhythm, normal heart sounds and intact distal pulses.   Pulmonary/Chest: Effort normal and breath sounds normal. She has no wheezes.  Abdominal: Soft. She exhibits no mass. Bowel sounds are decreased. There is tenderness in the epigastric area, left upper quadrant and left lower quadrant. There is CVA tenderness. There is no guarding and negative Murphy's sign.  Left cva ttp.  Genitourinary: Vagina normal and uterus normal. Cervix exhibits no motion tenderness. Right adnexum displays no mass and no tenderness. Left adnexum displays no mass and no tenderness. No vaginal discharge found.  Musculoskeletal: Normal range of motion.  Neurological: She is alert.  Skin: Skin is warm and dry.  Psychiatric: She has a normal mood and affect.    ED Course  Procedures (including critical care time) Labs Review Labs Reviewed  WET PREP, GENITAL - Abnormal; Notable for the following:    WBC, Wet Prep HPF POC TOO NUMEROUS TO COUNT (*)    All other components within normal limits  URINALYSIS, ROUTINE W REFLEX MICROSCOPIC - Abnormal; Notable for the following:    APPearance HAZY (*)    Hgb urine dipstick TRACE (*)    Leukocytes, UA TRACE (*)    All other components within normal limits  URINE MICROSCOPIC-ADD ON - Abnormal; Notable for the following:    Squamous Epithelial / LPF FEW (*)    All other components within normal limits  COMPREHENSIVE METABOLIC PANEL - Abnormal; Notable for the following:    Potassium 3.2 (*)    AST 175 (*)    ALT 336 (*)    All other components within normal limits  GC/CHLAMYDIA PROBE AMP  CBC WITH DIFFERENTIAL  LIPASE, BLOOD  PREGNANCY, URINE   Imaging Review No results found.  EKG Interpretation   None       Medications  morphine 4 MG/ML  injection 4 mg (4 mg Intravenous Given 07/29/13 1953)  ondansetron (ZOFRAN) injection 4 mg (4 mg Intravenous Given 07/29/13 1954)  HYDROmorphone (DILAUDID) injection 1 mg (1 mg Intravenous Given 07/29/13 2059)  HYDROmorphone (DILAUDID) injection 1 mg (1 mg Intravenous Given 07/29/13 2255)    MDM   1. Abdominal pain   2. Elevated liver enzymes   3. Gallbladder disease   4. Nausea and vomiting  Pt with predominantly left sided abdominal and flank pain, but with elevated liver enzymes.  At re-exam pt confirms no recent excessive tylenol use,  She does not drink etoh, not an IV drug user,  Nor does she have intimate contact with hep b or c positive persons.  Strong family history of gallbladder disease.  Bedside US completed by Dr. Lynelle Doctor with gallbladder wall thickening.  Pt to be admitted for formal US in am.   Burgess Amor, PA-C 07/29/13 2346

## 2013-07-29 NOTE — ED Notes (Signed)
L flank pain began last week w/hematuria. This past Wed began vomiting, was told to f/u w/PMD.  Talked to PMD today who told her to come to ER.

## 2013-07-29 NOTE — Telephone Encounter (Signed)
She was prescribe this on the 06/24/13 with 3 refills pharmacy commented that the 3 refills have already been used. ? Ok to refill

## 2013-07-29 NOTE — Telephone Encounter (Signed)
Prescription is denied, she is using too much medication, she can come in to office to discuss this

## 2013-07-29 NOTE — Telephone Encounter (Signed)
She has questions and more questions about her pain and nausea Please call [phone]

## 2013-07-29 NOTE — ED Notes (Signed)
Bedside sono in progress by dr Lynelle Doctor

## 2013-07-30 ENCOUNTER — Inpatient Hospital Stay (HOSPITAL_COMMUNITY): Payer: Medicaid Other

## 2013-07-30 ENCOUNTER — Encounter (HOSPITAL_COMMUNITY): Payer: Self-pay | Admitting: *Deleted

## 2013-07-30 DIAGNOSIS — E669 Obesity, unspecified: Secondary | ICD-10-CM

## 2013-07-30 DIAGNOSIS — R109 Unspecified abdominal pain: Secondary | ICD-10-CM

## 2013-07-30 DIAGNOSIS — E876 Hypokalemia: Secondary | ICD-10-CM

## 2013-07-30 LAB — CBC
Hemoglobin: 11.8 g/dL — ABNORMAL LOW (ref 12.0–15.0)
MCH: 31.1 pg (ref 26.0–34.0)
MCHC: 34 g/dL (ref 30.0–36.0)
MCV: 91.6 fL (ref 78.0–100.0)
RBC: 3.79 MIL/uL — ABNORMAL LOW (ref 3.87–5.11)

## 2013-07-30 LAB — COMPREHENSIVE METABOLIC PANEL
ALT: 247 U/L — ABNORMAL HIGH (ref 0–35)
CO2: 26 mEq/L (ref 19–32)
Calcium: 8.1 mg/dL — ABNORMAL LOW (ref 8.4–10.5)
Creatinine, Ser: 0.64 mg/dL (ref 0.50–1.10)
GFR calc Af Amer: >90 mL/min (ref >90)
GFR calc non Af Amer: >90 mL/min (ref >90)
Glucose, Bld: 88 mg/dL (ref 70–99)
Total Bilirubin: 0.5 mg/dL (ref 0.3–1.2)
Total Protein: 5.5 g/dL — ABNORMAL LOW (ref 6.0–8.3)

## 2013-07-30 LAB — HEPATITIS PANEL, ACUTE
HCV Ab: NEGATIVE
Hep A IgM: NONREACTIVE

## 2013-07-30 MED ORDER — HYDROCODONE-ACETAMINOPHEN 5-325 MG PO TABS
1.0000 | ORAL_TABLET | Freq: Four times a day (QID) | ORAL | Status: DC | PRN
  Administered 2013-07-30 – 2013-07-31 (×3): 1 via ORAL
  Filled 2013-07-30 (×3): qty 1

## 2013-07-30 MED ORDER — POTASSIUM CHLORIDE 10 MEQ/100ML IV SOLN
10.0000 meq | INTRAVENOUS | Status: AC
  Administered 2013-07-30 (×3): 10 meq via INTRAVENOUS
  Filled 2013-07-30 (×3): qty 100

## 2013-07-30 MED ORDER — INFLUENZA VAC SPLIT QUAD 0.5 ML IM SUSP
0.5000 mL | INTRAMUSCULAR | Status: AC
  Administered 2013-07-31: 0.5 mL via INTRAMUSCULAR
  Filled 2013-07-30: qty 0.5

## 2013-07-30 MED ORDER — ONDANSETRON HCL 4 MG PO TABS: 4.0000 mg | ORAL_TABLET | Freq: Four times a day (QID) | ORAL | Status: DC | PRN

## 2013-07-30 MED ORDER — LORAZEPAM 0.5 MG PO TABS
0.5000 mg | ORAL_TABLET | Freq: Once | ORAL | Status: AC
  Administered 2013-07-30: 0.5 mg via ORAL
  Filled 2013-07-30: qty 1

## 2013-07-30 MED ORDER — SODIUM CHLORIDE 0.9 % IV SOLN: INTRAVENOUS | Status: DC

## 2013-07-30 MED ORDER — ONDANSETRON HCL 4 MG/2ML IJ SOLN
4.0000 mg | Freq: Four times a day (QID) | INTRAMUSCULAR | Status: DC | PRN
  Administered 2013-07-30 (×2): 4 mg via INTRAVENOUS
  Filled 2013-07-30 (×2): qty 2

## 2013-07-30 MED ORDER — SODIUM CHLORIDE 0.9 % IV SOLN
INTRAVENOUS | Status: DC
  Administered 2013-07-30: 09:00:00 via INTRAVENOUS

## 2013-07-30 MED ORDER — SODIUM CHLORIDE 0.9 % IV SOLN
INTRAVENOUS | Status: DC
  Administered 2013-07-30 – 2013-07-31 (×2): via INTRAVENOUS

## 2013-07-30 MED ORDER — ENOXAPARIN SODIUM 40 MG/0.4ML ~~LOC~~ SOLN
40.0000 mg | SUBCUTANEOUS | Status: DC
  Administered 2013-07-30: 40 mg via SUBCUTANEOUS
  Filled 2013-07-30 (×2): qty 0.4

## 2013-07-30 MED ORDER — HYDROMORPHONE HCL PF 1 MG/ML IJ SOLN
1.0000 mg | INTRAMUSCULAR | Status: DC | PRN
  Administered 2013-07-30 (×3): 1 mg via INTRAVENOUS
  Filled 2013-07-30 (×4): qty 1

## 2013-07-30 MED ORDER — POTASSIUM CHLORIDE CRYS ER 20 MEQ PO TBCR
40.0000 meq | EXTENDED_RELEASE_TABLET | Freq: Once | ORAL | Status: AC
  Administered 2013-07-30: 40 meq via ORAL
  Filled 2013-07-30: qty 2

## 2013-07-30 MED ORDER — HYDROMORPHONE HCL PF 1 MG/ML IJ SOLN
1.0000 mg | INTRAMUSCULAR | Status: DC | PRN
  Administered 2013-07-30 – 2013-07-31 (×5): 1 mg via INTRAVENOUS
  Filled 2013-07-30 (×6): qty 1

## 2013-07-30 NOTE — ED Provider Notes (Signed)
See prior note   Ward Givens, MD 07/30/13 [phone]

## 2013-07-30 NOTE — H&P (Signed)
PCP:   Milinda Antis, MD   Chief Complaint:  Abdominal pain  HPI: 37 year old female who came to the hospital for evaluation of the left sided abdominal pain. Patient was seen in the ED on Wednesday when a CT scan showed 1 mm left renal stone and nose ureteral stones, patient was sent home on tramadol Patient continued to experience the left side pain and also started having nausea and vomiting. Today in the ED patient was found to have elevated liver enzymes, and bedside ultrasound done by the ED physician revealed possible thickening of the gallbladder. But no gallstones. Patient also had low grade temperature of 100.2, epigastric pain. She denies history of alcohol abuse. Her lipase and alkaline phosphatase are  Normal.  Allergies:   Allergies  Allergen Reactions  . Darvocet [Propoxyphene-Acetaminophen] Nausea Only  . Penicillins     REACTION: Not sure - mom told her she was allergic      Past Medical History  Diagnosis Date  . Migraine   . Neck fracture     C1  . DJD (degenerative joint disease), lumbar   . Lymph node abscess     s/p removal  . Ovarian cyst     Followed by GYN Dr. Ralph Dowdy  . Anxiety   . Depression   . Seizures     Past Surgical History  Procedure Laterality Date  . Back surgery    . Tonsillectomy    . Tubal ligation    . Colposcopy      CIN-1 Dr. Ralph Dowdy (GYN)/ HPV    Prior to Admission medications   Medication Sig Start Date End Date Taking? Authorizing Provider  acetaminophen (TYLENOL) 500 MG tablet Take 1,000 mg by mouth every 6 (six) hours as needed for mild pain or moderate pain.   Yes Historical Provider, MD  cyclobenzaprine (FLEXERIL) 10 MG tablet Take 1 tablet (10 mg total) by mouth 2 (two) times daily as needed for muscle spasms. 06/24/13 06/24/14 Yes Salley Scarlet, MD  promethazine (PHENERGAN) 25 MG tablet Take 1 tablet (25 mg total) by mouth every 6 (six) hours as needed for nausea or vomiting. 07/27/13  Yes Donnetta Hutching, MD  traMADol  (ULTRAM) 50 MG tablet Take 100 mg by mouth 3 (three) times daily.   Yes Historical Provider, MD  albuterol (PROVENTIL HFA;VENTOLIN HFA) 108 (90 BASE) MCG/ACT inhaler Inhale 2 puffs into the lungs every 6 (six) hours as needed for wheezing. 05/18/13   Salley Scarlet, MD    Social History:  reports that she has quit smoking. She does not have any smokeless tobacco history on file. She reports that she drinks alcohol. She reports that she does not use illicit drugs.  Family History  Problem Relation Age of Onset  . Heart disease Father     MI  . Depression Father   . Hyperlipidemia Mother   . Heart disease Maternal Grandfather   . Hyperlipidemia Maternal Grandfather   . Cancer Maternal Grandfather     Colon cancer     All the positives are listed in BOLD  Review of Systems:  HEENT: Headache, blurred vision, runny nose, sore throat Neck: Hypothyroidism, hyperthyroidism,,lymphadenopathy Chest : Shortness of breath, history of COPD, Asthma Heart : Chest pain, history of coronary arterey disease GI:  Nausea, vomiting, diarrhea, constipation, GERD GU: Dysuria, urgency, frequency of urination, hematuria Neuro: Stroke, seizures, syncope Psych: Depression, anxiety, hallucinations   Physical Exam: Blood pressure 142/90, pulse 80, temperature 98.8 F (37.1 C), temperature source  Oral, resp. rate 18, height 5\' 6"  (1.676 m), weight 72.576 kg (160 lb), last menstrual period 07/19/2013, SpO2 94.00%. Constitutional:   Patient is a well-developed and well-nourished female* in no acute distress and cooperative with exam. Head: Normocephalic and atraumatic Mouth: Mucus membranes moist Eyes: PERRL, EOMI, conjunctivae normal Neck: Supple, No Thyromegaly Cardiovascular: RRR, S1 normal, S2 normal Pulmonary/Chest: CTAB, no wheezes, rales, or rhonchi Abdominal: Soft. Positive tender to palpation in right upper quadrant and left lower quadrant, non-distended, bowel sounds are normal, no masses,  organomegaly, or guarding present.  Neurological: A&O x3, Strenght is normal and symmetric bilaterally, cranial nerve II-XII are grossly intact, no focal motor deficit, sensory intact to light touch bilaterally.  Extremities : No Cyanosis, Clubbing or Edema   Labs on Admission:  Results for orders placed during the hospital encounter of 07/29/13 (from the past 48 hour(s))  URINALYSIS, ROUTINE W REFLEX MICROSCOPIC     Status: Abnormal   Collection Time    07/29/13  4:38 PM      Result Value Range   Color, Urine YELLOW  YELLOW   APPearance HAZY (*) CLEAR   Specific Gravity, Urine 1.020  1.005 - 1.030   pH 6.0  5.0 - 8.0   Glucose, UA NEGATIVE  NEGATIVE mg/dL   Hgb urine dipstick TRACE (*) NEGATIVE   Bilirubin Urine NEGATIVE  NEGATIVE   Ketones, ur NEGATIVE  NEGATIVE mg/dL   Protein, ur NEGATIVE  NEGATIVE mg/dL   Urobilinogen, UA 0.2  0.0 - 1.0 mg/dL   Nitrite NEGATIVE  NEGATIVE   Leukocytes, UA TRACE (*) NEGATIVE  URINE MICROSCOPIC-ADD ON     Status: Abnormal   Collection Time    07/29/13  4:38 PM      Result Value Range   Squamous Epithelial / LPF FEW (*) RARE   WBC, UA 7-10  <3 WBC/hpf   RBC / HPF 0-2  <3 RBC/hpf   Bacteria, UA RARE  RARE  PREGNANCY, URINE     Status: None   Collection Time    07/29/13  7:32 PM      Result Value Range   Preg Test, Ur NEGATIVE  NEGATIVE   Comment:            THE SENSITIVITY OF THIS     METHODOLOGY IS >20 mIU/mL.  CBC WITH DIFFERENTIAL     Status: None   Collection Time    07/29/13  7:33 PM      Result Value Range   WBC 5.2  4.0 - 10.5 K/uL   RBC 4.24  3.87 - 5.11 MIL/uL   Hemoglobin 13.2  12.0 - 15.0 g/dL   HCT 16.1  09.6 - 04.5 %   MCV 90.1  78.0 - 100.0 fL   MCH 31.1  26.0 - 34.0 pg   MCHC 34.6  30.0 - 36.0 g/dL   RDW 40.9  81.1 - 91.4 %   Platelets 214  150 - 400 K/uL   Neutrophils Relative % 51  43 - 77 %   Neutro Abs 2.7  1.7 - 7.7 K/uL   Lymphocytes Relative 37  12 - 46 %   Lymphs Abs 2.0  0.7 - 4.0 K/uL   Monocytes  Relative 9  3 - 12 %   Monocytes Absolute 0.5  0.1 - 1.0 K/uL   Eosinophils Relative 2  0 - 5 %   Eosinophils Absolute 0.1  0.0 - 0.7 K/uL   Basophils Relative 0  0 - 1 %  Basophils Absolute 0.0  0.0 - 0.1 K/uL  COMPREHENSIVE METABOLIC PANEL     Status: Abnormal   Collection Time    07/29/13  7:33 PM      Result Value Range   Sodium 139  135 - 145 mEq/L   Potassium 3.2 (*) 3.5 - 5.1 mEq/L   Chloride 101  96 - 112 mEq/L   CO2 29  19 - 32 mEq/L   Glucose, Bld 93  70 - 99 mg/dL   BUN 8  6 - 23 mg/dL   Creatinine, Ser 1.61  0.50 - 1.10 mg/dL   Calcium 9.1  8.4 - 09.6 mg/dL   Total Protein 6.4  6.0 - 8.3 g/dL   Albumin 3.5  3.5 - 5.2 g/dL   AST 045 (*) 0 - 37 U/L   ALT 336 (*) 0 - 35 U/L   Alkaline Phosphatase 60  39 - 117 U/L   Total Bilirubin 0.5  0.3 - 1.2 mg/dL   GFR calc non Af Amer >90  >90 mL/min   GFR calc Af Amer >90  >90 mL/min   Comment: (NOTE)     The eGFR has been calculated using the CKD EPI equation.     This calculation has not been validated in all clinical situations.     eGFR's persistently <90 mL/min signify possible Chronic Kidney     Disease.  LIPASE, BLOOD     Status: None   Collection Time    07/29/13  7:33 PM      Result Value Range   Lipase 38  11 - 59 U/L  WET PREP, GENITAL     Status: Abnormal   Collection Time    07/29/13 10:22 PM      Result Value Range   Yeast Wet Prep HPF POC NONE SEEN  NONE SEEN   Trich, Wet Prep NONE SEEN  NONE SEEN   Clue Cells Wet Prep HPF POC NONE SEEN  NONE SEEN   WBC, Wet Prep HPF POC TOO NUMEROUS TO COUNT (*) NONE SEEN    Radiological Exams on Admission: No results found.  Assessment/Plan Principal Problem:   Abdominal pain Active Problems:   COMMON MIGRAINE   DEGENERATIVE DISC DISEASE, LUMBAR SPINE   Transaminitis  37 year old female with left lower quadrant pain, CT abdomen done 2 days ago has been negative for acute abnormality except 1 mm stone in the left kidney. Today she presents with elevated LFTs,  with bedside  ultrasound done by the ED physician revealing possible thickening of the gallbladder. Will admit the patient to possible cholecystitis versus hepatitis. We'll obtain ultrasound abdominal in the morning. Will also check hepatitis panel. Patient has low potassium which will be repeated. Will check BMP in the morning. Patient has normal white count, and does not appear toxic so I will not start antibiotics at this time until the ultrasound is done in the a.m. patient will be given Zofran when necessary for the nausea and vomiting and Dilaudid for the pain  Code status: Presumed full code     Time Spent on Admission: 60 min   Adonis Ryther S [facility] Pager: [phone] 07/30/2013, 12:09 AM  If 7PM-7AM, please contact night-coverage  [email or web address]  Password [email or web address]

## 2013-07-30 NOTE — Progress Notes (Signed)
[facility] PROGRESS NOTE  [name] AOZ:[id number] DOB: 1976-03-07 DOA: 07/29/2013 PCP: Milinda Antis, MD  Assessment/Plan: 1. Abdominal pain. Patient having ongoing right upper quadrant pain. Initial lab work showed elevated transaminases with normal bilirubin. Lipase level was within normal limits as well. Abdominal ultrasound showed an ALT normal-appearing gallbladder, with Dr. Lovell Sheehan recommending pursuing HIDA scan. She remains afebrile, hemodynamically stable, with a normal white count. 2. 1 mm nonobstructing stone. It's conceivable that pain symptoms could be related to patient passing stone. Ordering a HIDA scan to assess for possible gallbladder causes of her abdominal pain. 3. Elevated transaminases. Could be secondary to gallbladder disease. Hepatitis panel ordered, results pending. Transaminases did trend down from yesterday's lab work. 4. Hypokalemia. Likely secondary to GI losses as patient reports having several episodes of nausea and vomiting. She was administered oral potassium replacement today. 5. Nutrition. Make patient n.p.o. for HIDA scan.  Code Status: Full code Family Communication: Plan discussed with patient at bedside Disposition Plan: Obtain HIDA scan today, continue supportive care.   Consultants:  Telephone consultation made to Dr. Lovell Sheehan   HPI/Subjective: Patient is a pleasant 37 year old female who is admitted overnight, presented with complaints of abdominal pain, nausea vomiting, with initial CT scan of abdomen showing a 1 mm nonobstructing stone. This morning she complained of right upper quadrant pain, having a positive Murphy's sign, for which ultrasound was ordered. This study showed a normal-appearing gallbladder. I discussed case with general surgery Dr. Lovell Sheehan given clinical suspicion for gallbladder disease. He recommended performing HIDA scan.  Objective: Filed Vitals:   07/30/13 0552  BP: 135/88  Pulse: 76  Temp: 98.2 F (36.8  C)  Resp: 18   No intake or output data in the 24 hours ending 07/30/13 1129 Filed Weights   07/29/13 1625 07/30/13 0000  Weight: 72.576 kg (160 lb) 71.079 kg (156 lb 11.2 oz)    Exam:   General:  Nontoxic appearing, no acute distress she is awake and alert.  Cardiovascular: Regular rate and rhythm normal S1-S2 no murmurs rubs or gallops  Respiratory: Lungs are clear to auscultation bilaterally  Abdomen: She has right upper quadrant tenderness to palpation, positive Murphy's sign. No rebound tenderness guarding or peritoneal signs  Musculoskeletal: Preserved range of motion to all extremities   Data Reviewed: Basic Metabolic Panel:  Recent Labs Lab 07/29/13 1933 07/30/13 0552  NA 139 141  K 3.2* 3.2*  CL 101 106  CO2 29 26  GLUCOSE 93 88  BUN 8 5*  CREATININE 0.74 0.64  CALCIUM 9.1 8.1*   Liver Function Tests:  Recent Labs Lab 07/29/13 1933 07/30/13 0552  AST 175* 100*  ALT 336* 247*  ALKPHOS 60 47  BILITOT 0.5 0.5  PROT 6.4 5.5*  ALBUMIN 3.5 3.0*    Recent Labs Lab 07/29/13 1933  LIPASE 38   No results found for this basename: AMMONIA,  in the last 168 hours CBC:  Recent Labs Lab 07/29/13 1933 07/30/13 0552  WBC 5.2 6.4  NEUTROABS 2.7  --   HGB 13.2 11.8*  HCT 38.2 34.7*  MCV 90.1 91.6  PLT 214 191   Cardiac Enzymes: No results found for this basename: CKTOTAL, CKMB, CKMBINDEX, TROPONINI,  in the last 168 hours BNP (last 3 results) No results found for this basename: PROBNP,  in the last 8760 hours CBG: No results found for this basename: GLUCAP,  in the last 168 hours  Recent Results (from the past 240 hour(s))  WET PREP, GENITAL  Status: Abnormal   Collection Time    07/29/13 10:22 PM      Result Value Range Status   Yeast Wet Prep HPF POC NONE SEEN  NONE SEEN Final   Trich, Wet Prep NONE SEEN  NONE SEEN Final   Clue Cells Wet Prep HPF POC NONE SEEN  NONE SEEN Final   WBC, Wet Prep HPF POC TOO NUMEROUS TO COUNT (*) NONE SEEN  Final     Studies: US Abdomen Complete  07/30/2013   CLINICAL DATA:  Elevated LFTs  EXAM: ULTRASOUND ABDOMEN COMPLETE  COMPARISON:  None.  FINDINGS: Gallbladder  No gallstones or wall thickening visualized. No sonographic Murphy sign noted.  Common bile duct  Diameter: 3.7  Liver  No focal lesion identified. Within normal limits in parenchymal echogenicity.  IVC  No abnormality visualized.  Pancreas  Visualized portion unremarkable.  Spleen  Size and appearance within normal limits.  Right Kidney  Length: 11.2 cm. Echogenicity within normal limits. No mass or hydronephrosis visualized.  Left Kidney  Length: 10.9 cm. Echogenicity within normal limits. No mass or hydronephrosis visualized.  Abdominal aorta  No aneurysm visualized.  IMPRESSION: Normal abdominal sonogram.   Electronically Signed   By: Signa Kell M.D.   On: 07/30/2013 10:35    Scheduled Meds: . enoxaparin (LOVENOX) injection  40 mg Subcutaneous Q24H  . [START ON 07/31/2013] influenza vac split quadrivalent PF  0.5 mL Intramuscular Tomorrow-1000   Continuous Infusions: . sodium chloride 100 mL/hr at 07/30/13 0850  . sodium chloride 100 mL/hr at 07/30/13 0100    Principal Problem:   Abdominal pain Active Problems:   COMMON MIGRAINE   DEGENERATIVE DISC DISEASE, LUMBAR SPINE   Transaminitis    Time spent: 35 minutes    Jeralyn Bennett  [facility] Pager [phone]. If 7PM-7AM, please contact night-coverage at [email or web address], password [facility] 07/30/2013, 11:29 AM  LOS: 1 day

## 2013-07-31 DIAGNOSIS — F411 Generalized anxiety disorder: Secondary | ICD-10-CM

## 2013-07-31 LAB — COMPREHENSIVE METABOLIC PANEL
ALT: 164 U/L — ABNORMAL HIGH (ref 0–35)
AST: 47 U/L — ABNORMAL HIGH (ref 0–37)
Albumin: 3.1 g/dL — ABNORMAL LOW (ref 3.5–5.2)
Alkaline Phosphatase: 53 U/L (ref 39–117)
CO2: 25 mEq/L (ref 19–32)
Chloride: 103 mEq/L (ref 96–112)
Creatinine, Ser: 0.63 mg/dL (ref 0.50–1.10)
Potassium: 3.6 mEq/L (ref 3.5–5.1)
Sodium: 135 mEq/L (ref 135–145)
Total Bilirubin: 0.5 mg/dL (ref 0.3–1.2)

## 2013-07-31 LAB — CBC
HCT: 36.7 % (ref 36.0–46.0)
MCH: 31.1 pg (ref 26.0–34.0)
MCHC: 33.5 g/dL (ref 30.0–36.0)
MCV: 92.9 fL (ref 78.0–100.0)
Platelets: 178 10*3/uL (ref 150–400)
RBC: 3.95 MIL/uL (ref 3.87–5.11)
RDW: 13.6 % (ref 11.5–15.5)

## 2013-07-31 MED ORDER — HYDROCODONE-ACETAMINOPHEN 5-325 MG PO TABS: 1.0000 | ORAL_TABLET | Freq: Four times a day (QID) | ORAL | Status: DC | PRN

## 2013-07-31 MED ORDER — PANTOPRAZOLE SODIUM 40 MG PO TBEC: 40.0000 mg | DELAYED_RELEASE_TABLET | Freq: Every day | ORAL | Status: DC

## 2013-07-31 NOTE — Progress Notes (Signed)
Patient states understanding of discharge instructions, prescriptions given 

## 2013-07-31 NOTE — Discharge Summary (Signed)
Physician Discharge Summary  [name] UJW:[id number] DOB: Dec 26, 1975 DOA: 07/29/2013  PCP: Milinda Antis, MD  Admit date: 07/29/2013 Discharge date: 07/31/2013  Time spent: 35 minutes  Recommendations for Outpatient Follow-up:  1. Please followup on patient's abdominal pain, has appointment with general surgery this week will need HIDA scan.  Discharge Diagnoses:  Principal Problem:   Abdominal pain Active Problems:   COMMON MIGRAINE   DEGENERATIVE DISC DISEASE, LUMBAR SPINE   Transaminitis   Discharge Condition: Stable/Improved  Diet recommendation: Heart Healthy  Filed Weights   07/29/13 1625 07/30/13 0000  Weight: 72.576 kg (160 lb) 71.079 kg (156 lb 11.2 oz)    History of present illness:  37 year old female who came to the hospital for evaluation of the left sided abdominal pain. Patient was seen in the ED on Wednesday when a CT scan showed 1 mm left renal stone and nose ureteral stones, patient was sent home on tramadol Patient continued to experience the left side pain and also started having nausea and vomiting. Today in the ED patient was found to have elevated liver enzymes, and bedside ultrasound done by the ED physician revealed possible thickening of the gallbladder. But no gallstones. Patient also had low grade temperature of 100.2, epigastric pain. She denies history of alcohol abuse. Her lipase and alkaline phosphatase are Normal.  Hospital Course:  Patient is a pleasant 37 year old female with a past medical history of anxiety, seizure disorder, who was admitted to the medicine service on 07/30/2013, presenting with abdominal pain located in the epigastric and right upper quadrant region. Initial CT scan of abdomen and pelvis showed a 1 mm nonobstructing stone in the left upper pole otherwise no acute intra-abdominal pathology noted. Lab work also revealed elevated transaminases with an AST of 175 and ALT of 336, normal bilirubin at 0.5 and a normal alkaline  phosphatase at 60. With the right upper quadrant pain and elevated transaminases there is concerns for this reflecting biliary disease for which she had a right upper quadrant ultrasound performed on 07/30/2013. The study did not reveal gallstones or wall thickening. I discussed case with Dr. Lovell Sheehan is general surgery. He recommended followup with HIDA scan. Will 23 2014 she reported feeling well, expressed a desire to be discharged. She preferred to have HIDA scan done as an outpatient rather than waiting until Monday. I discussed this with Dr. Lovell Sheehan who agreed with this plan, and stated he would follow her up early this week. With regard to her elevated transaminases they did trend down and by 07/31/2013 her AST was 47 and ALT 164. Total bilirubin and alkaline phosphatase remained within normal limits. Her white count also remained within normal limits.  Procedures:  Abdominal ultrasound performed on 07/28/2013 impression: Gallbladder, no gallstones or wall thickening visualized.  Consultations:  Telephone consultation made to Dr. Lovell Sheehan  Discharge Exam: Filed Vitals:   07/31/13 0514  BP: 129/85  Pulse: 90  Temp: 98.1 F (36.7 C)  Resp: 18   General: Nontoxic appearing, no acute distress she is awake and alert.  Cardiovascular: Regular rate and rhythm normal S1-S2 no murmurs rubs or gallops Respiratory: Lungs are clear to auscultation bilaterally Abdomen: She has mild pain to palpation over epigastric region and RUQ region. No rebound tenderness.  Musculoskeletal: Preserved range of motion to all extremities    Discharge Instructions  Discharge Orders   Future Orders Complete By Expires   Call MD for:  difficulty breathing, headache or visual disturbances  As directed  Call MD for:  extreme fatigue  As directed    Call MD for:  persistant nausea and vomiting  As directed    Call MD for:  severe uncontrolled pain  As directed    Call MD for:  temperature >100.4  As directed     Diet - low sodium heart healthy  As directed    Increase activity slowly  As directed        Medication List    STOP taking these medications       acetaminophen 500 MG tablet  Commonly known as:  TYLENOL     traMADol 50 MG tablet  Commonly known as:  ULTRAM      TAKE these medications       albuterol 108 (90 BASE) MCG/ACT inhaler  Commonly known as:  PROVENTIL HFA;VENTOLIN HFA  Inhale 2 puffs into the lungs every 6 (six) hours as needed for wheezing.     cyclobenzaprine 10 MG tablet  Commonly known as:  FLEXERIL  Take 1 tablet (10 mg total) by mouth 2 (two) times daily as needed for muscle spasms.     HYDROcodone-acetaminophen 5-325 MG per tablet  Commonly known as:  NORCO/VICODIN  Take 1 tablet by mouth every 6 (six) hours as needed for moderate pain.     pantoprazole 40 MG tablet  Commonly known as:  PROTONIX  Take 1 tablet (40 mg total) by mouth daily.     promethazine 25 MG tablet  Commonly known as:  PHENERGAN  Take 1 tablet (25 mg total) by mouth every 6 (six) hours as needed for nausea or vomiting.       Allergies  Allergen Reactions  . Darvocet [Propoxyphene-Acetaminophen] Nausea Only  . Penicillins     REACTION: Not sure - mom told her she was allergic       Follow-up Information   Follow up with Dalia Heading, MD. Schedule an appointment as soon as possible for a visit on 08/02/2013.   Specialty:  General Surgery   Contact information:   [address] [place] [address] [phone]       Follow up with Milinda Antis, MD In 1 week.   Specialty:  Family Medicine   Contact information:   [address] [place] [address] [phone]        The results of significant diagnostics from this hospitalization (including imaging, microbiology, ancillary and laboratory) are listed below for reference.    Significant Diagnostic Studies: Ct Abdomen Pelvis Wo Contrast  07/27/2013   CLINICAL DATA:  Left flank pain  EXAM:  CT ABDOMEN AND PELVIS WITHOUT CONTRAST  TECHNIQUE: Multidetector CT imaging of the abdomen and pelvis was performed following the standard protocol without intravenous contrast.  COMPARISON:  CT 02/13/2012  FINDINGS: 1 millimeter nonobstructing stone left upper pole. No other renal calculi. No hydronephrosis or ureteral calculus. The bladder is normal.  Lung bases are clear. Unenhanced images of the liver and gallbladder are normal. Pancreas and spleen are negative.  Negative for bowel obstruction or bowel thickening. Appendix is normal. Fallopian tube clips are noted bilaterally. Negative for mass or adenopathy.  IMPRESSION: 1 millimeter nonobstructing stone left upper pole. No acute abnormality.   Electronically Signed   By: Marlan Palau M.D.   On: 07/27/2013 10:04   US Abdomen Complete  07/30/2013   CLINICAL DATA:  Elevated LFTs  EXAM: ULTRASOUND ABDOMEN COMPLETE  COMPARISON:  None.  FINDINGS: Gallbladder  No gallstones or wall thickening visualized. No  sonographic Murphy sign noted.  Common bile duct  Diameter: 3.7  Liver  No focal lesion identified. Within normal limits in parenchymal echogenicity.  IVC  No abnormality visualized.  Pancreas  Visualized portion unremarkable.  Spleen  Size and appearance within normal limits.  Right Kidney  Length: 11.2 cm. Echogenicity within normal limits. No mass or hydronephrosis visualized.  Left Kidney  Length: 10.9 cm. Echogenicity within normal limits. No mass or hydronephrosis visualized.  Abdominal aorta  No aneurysm visualized.  IMPRESSION: Normal abdominal sonogram.   Electronically Signed   By: Signa Kell M.D.   On: 07/30/2013 10:35    Microbiology: Recent Results (from the past 240 hour(s))  WET PREP, GENITAL     Status: Abnormal   Collection Time    07/29/13 10:22 PM      Result Value Range Status   Yeast Wet Prep HPF POC NONE SEEN  NONE SEEN Final   Trich, Wet Prep NONE SEEN  NONE SEEN Final   Clue Cells Wet Prep HPF POC NONE SEEN  NONE SEEN  Final   WBC, Wet Prep HPF POC TOO NUMEROUS TO COUNT (*) NONE SEEN Final     Labs: Basic Metabolic Panel:  Recent Labs Lab 07/29/13 1933 07/30/13 0552 07/31/13 0547  NA 139 141 135  K 3.2* 3.2* 3.6  CL 101 106 103  CO2 29 26 25   GLUCOSE 93 88 87  BUN 8 5* 5*  CREATININE 0.74 0.64 0.63  CALCIUM 9.1 8.1* 8.7   Liver Function Tests:  Recent Labs Lab 07/29/13 1933 07/30/13 0552 07/31/13 0547  AST 175* 100* 47*  ALT 336* 247* 164*  ALKPHOS 60 47 53  BILITOT 0.5 0.5 0.5  PROT 6.4 5.5* 6.0  ALBUMIN 3.5 3.0* 3.1*    Recent Labs Lab 07/29/13 1933  LIPASE 38   No results found for this basename: AMMONIA,  in the last 168 hours CBC:  Recent Labs Lab 07/29/13 1933 07/30/13 0552 07/31/13 0547  WBC 5.2 6.4 3.9*  NEUTROABS 2.7  --   --   HGB 13.2 11.8* 12.3  HCT 38.2 34.7* 36.7  MCV 90.1 91.6 92.9  PLT 214 191 178   Cardiac Enzymes: No results found for this basename: CKTOTAL, CKMB, CKMBINDEX, TROPONINI,  in the last 168 hours BNP: BNP (last 3 results) No results found for this basename: PROBNP,  in the last 8760 hours CBG: No results found for this basename: GLUCAP,  in the last 168 hours     Signed:  Jeralyn Bennett  [facility] 07/31/2013, 9:47 AM

## 2013-08-01 ENCOUNTER — Other Ambulatory Visit (HOSPITAL_COMMUNITY): Payer: Self-pay | Admitting: Internal Medicine

## 2013-08-01 DIAGNOSIS — R109 Unspecified abdominal pain: Secondary | ICD-10-CM

## 2013-08-01 NOTE — Progress Notes (Signed)
UR chart review completed.  

## 2013-08-02 ENCOUNTER — Encounter (HOSPITAL_COMMUNITY): Payer: Self-pay

## 2013-08-02 ENCOUNTER — Encounter (HOSPITAL_COMMUNITY)
Admission: RE | Admit: 2013-08-02 | Discharge: 2013-08-02 | Disposition: A | Discharge status: Home / Self Care | Payer: Medicaid Other | Source: Ambulatory Visit | Attending: General Surgery | Admitting: General Surgery

## 2013-08-02 HISTORY — DX: Other specified postprocedural states: Z98.890

## 2013-08-02 HISTORY — DX: Other specified postprocedural states: R11.2

## 2013-08-02 LAB — GC/CHLAMYDIA PROBE AMP: CT Probe RNA: NEGATIVE

## 2013-08-02 NOTE — Telephone Encounter (Signed)
LMTRC

## 2013-08-02 NOTE — Patient Instructions (Signed)
[name]  08/02/2013   Your procedure is scheduled on:  08/03/2013  Report to [facility] at  820  AM.  Call this number if you have problems the morning of surgery: [phone]   Remember:   Do not eat food or drink liquids after midnight.   Take these medicines the morning of surgery with A SIP OF WATER:  Flexaril, norco, protonix, phrnergan. Take your inhaler before you come.   Do not wear jewelry, make-up or nail polish.  Do not wear lotions, powders, or perfumes.   Do not shave 48 hours prior to surgery. Men may shave face and neck.  Do not bring valuables to the hospital.  [facility] is not responsible for any belongings or valuables.               Contacts, dentures or bridgework may not be worn into surgery.  Leave suitcase in the car. After surgery it may be brought to your room.  For patients admitted to the hospital, discharge time is determined by your treatment team.               Patients discharged the day of surgery will not be allowed to drive home.  Name and phone number of your driver: family  Special Instructions: Shower using CHG 2 nights before surgery and the night before surgery.  If you shower the day of surgery use CHG.  Use special wash - you have one bottle of CHG for all showers.  You should use approximately 1/3 of the bottle for each shower.   Please read over the following fact sheets that you were given: Pain Booklet, Coughing and Deep Breathing, MRSA Information, Surgical Site Infection Prevention, Anesthesia Post-op Instructions and Care and Recovery After Surgery Laparoscopic Cholecystectomy Laparoscopic cholecystectomy is surgery to remove the gallbladder. The gallbladder is located slightly to the right of center in the abdomen, behind the liver. It is a concentrating and storage sac for the bile produced in the liver. Bile aids in the digestion and absorption of fats. Gallbladder disease (cholecystitis) is an inflammation of your  gallbladder. This condition is usually caused by a buildup of gallstones (cholelithiasis) in your gallbladder. Gallstones can block the flow of bile, resulting in inflammation and pain. In severe cases, emergency surgery may be required. When emergency surgery is not required, you will have time to prepare for the procedure. Laparoscopic surgery is an alternative to open surgery. Laparoscopic surgery usually has a shorter recovery time. Your common bile duct may also need to be examined and explored. Your caregiver will discuss this with you if he or she feels this should be done. If stones are found in the common bile duct, they may be removed. LET YOUR CAREGIVER KNOW ABOUT:  Allergies to food or medicine.  Medicines taken, including vitamins, herbs, eyedrops, over-the-counter medicines, and creams.  Use of steroids (by mouth or creams).  Previous problems with anesthetics or numbing medicines.  History of bleeding problems or blood clots.  Previous surgery.  Other health problems, including diabetes and kidney problems.  Possibility of pregnancy, if this applies. RISKS AND COMPLICATIONS All surgery is associated with risks. Some problems that may occur following this procedure include:  Infection.  Damage to the common bile duct, nerves, arteries, veins, or other internal organs such as the stomach or intestines.  Bleeding.  A stone may remain in the common bile duct. BEFORE THE PROCEDURE  Do not take aspirin for 3  days prior to surgery or blood thinners for 1 week prior to surgery.  Do not eat or drink anything after midnight the night before surgery.  Let your caregiver know if you develop a cold or other infectious problem prior to surgery.  You should be present 60 minutes before the procedure or as directed. PROCEDURE  You will be given medicine that makes you sleep (general anesthetic). When you are asleep, your surgeon will make several small cuts (incisions) in your  abdomen. One of these incisions is used to insert a small, lighted scope (laparoscope) into the abdomen. The laparoscope helps the surgeon see into your abdomen. Carbon dioxide gas will be pumped into your abdomen. The gas allows more room for the surgeon to perform your surgery. Other operating instruments are inserted through the other incisions. Laparoscopic procedures may not be appropriate when:  There is major scarring from previous surgery.  The gallbladder is extremely inflamed.  There are bleeding disorders or unexpected cirrhosis of the liver.  A pregnancy is near term.  Other conditions make the laparoscopic procedure impossible. If your surgeon feels it is not safe to continue with a laparoscopic procedure, he or she will perform an open abdominal procedure. In this case, the surgeon will make an incision to open the abdomen. This gives the surgeon a larger view and field to work within. This may allow the surgeon to perform procedures that sometimes cannot be performed with a laparoscope alone. Open surgery has a longer recovery time. AFTER THE PROCEDURE  You will be taken to the recovery area where a nurse will watch and check your progress.  You may be allowed to go home the same day.  Do not resume physical activities until directed by your caregiver.  You may resume a normal diet and activities as directed. Document Released: 08/25/2005 Document Revised: 11/17/2011 Document Reviewed: 04/06/2013 [facility] Patient Information 2014 [place], [place]. PATIENT INSTRUCTIONS POST-ANESTHESIA  IMMEDIATELY FOLLOWING SURGERY:  Do not drive or operate machinery for the first twenty four hours after surgery.  Do not make any important decisions for twenty four hours after surgery or while taking narcotic pain medications or sedatives.  If you develop intractable nausea and vomiting or a severe headache please notify your doctor immediately.  FOLLOW-UP:  Please make an appointment with  your surgeon as instructed. You do not need to follow up with anesthesia unless specifically instructed to do so.  WOUND CARE INSTRUCTIONS (if applicable):  Keep a dry clean dressing on the anesthesia/puncture wound site if there is drainage.  Once the wound has quit draining you may leave it open to air.  Generally you should leave the bandage intact for twenty four hours unless there is drainage.  If the epidural site drains for more than 36-48 hours please call the anesthesia department.  QUESTIONS?:  Please feel free to call your physician or the hospital operator if you have any questions, and they will be happy to assist you.

## 2013-08-02 NOTE — H&P (Signed)
  NTS SOAP Note  Vital Signs:  Vitals as of: 08/02/2013: Systolic 142: Diastolic 105: Heart Rate 97: Temp 98.65F: Height 73ft 6in: Weight 168Lbs 0 Ounces: Pain Level 10: BMI 27.12  BMI : 27.12 kg/m2  Subjective: This 37 Years 0 Months old Female presents for of abdominal pain.  Has been having intermittent right upper quadrant abdominal pain and nausea over the past week.  Was in hospital, LFT's elevated but improving.  No hyperbilirubinemia.  U/S of gallbladder negative.  Pain seems to be worsening.  No fever, chills, jaundice.  Multiple family members have had their gallbladder removed.  Review of Symptoms:  Constitutional:unremarkable   Head:unremarkable    Eyes:unremarkable   sinsu problems Cardiovascular:  unremarkable   Respiratory:unremarkable   Gastrointestin    abdominal pain,nausea,heartburn Genitourinary:unremarkable       joint, neck, back pain Skin:unremarkable Hematolgic/Lymphatic:unremarkable     Allergic/Immunologic:unremarkable     Past Medical History:    Reviewed   Past Medical History  Surgical History: back surgery, lymph node biopsy Medical Problems: none Allergies: pcn, darvocet Medications: vicodin   Social History:Reviewed  Social History  Preferred Language: English Race:  White Ethnicity: Hispanic / Latino Age: 37 Years 0 Months Marital Status:  D Alcohol:  No Recreational drug(s):  No   Smoking Status: Never smoker reviewed on 08/02/2013 Functional Status reviewed on mm/dd/yyyy ------------------------------------------------ Bathing: Normal Cooking: Normal Dressing: Normal Driving: Normal Eating: Normal Managing Meds: Normal Oral Care: Normal Shopping: Normal Toileting: Normal Transferring: Normal Walking: Normal Cognitive Status reviewed on mm/dd/yyyy ------------------------------------------------ Attention: Normal Decision Making: Normal Language: Normal Memory: Normal Motor:  Normal Perception: Normal Problem Solving: Normal Visual and Spatial: Normal   Family History:  Reviewed  Family Health History Mother, Living; Healthy; healthy Father, Deceased; Heart attack (myocardial infarction);     Objective Information: General:  Well appearing, well nourished in no distress.   no scleral icterus Heart:  RRR, no murmur Lungs:    CTA bilaterally, no wheezes, rhonchi, rales.  Breathing unlabored. Abdomen:Soft, tender in right upper quadrant to deep palpation, ND, no HSM, no masses.  Assessment:Chronic cholecystitis  Diagnosis &amp; Procedure Smart Code   Plan:Scheduled for laparoscopic cholecystectomy on 08/03/13.   Patient Education:Alternative treatments to surgery were discussed with patient (and family).  Risks and benefits  of procedure including bleeding, infection, hepatobiliary injury, and the possibility of an open procedure were fully explained to the patient (and family) who gave informed consent. Patient/family questions were addressed.  Follow-up:Pending Surgery

## 2013-08-02 NOTE — Telephone Encounter (Signed)
Pt went to ED with pain in left side dx with gallstones and kidney stones.

## 2013-08-03 ENCOUNTER — Encounter (HOSPITAL_COMMUNITY)
Admission: RE | Disposition: A | Discharge status: Home / Self Care | Payer: Self-pay | Source: Ambulatory Visit | Attending: General Surgery

## 2013-08-03 ENCOUNTER — Encounter (HOSPITAL_COMMUNITY): Payer: Self-pay | Admitting: Anesthesiology

## 2013-08-03 ENCOUNTER — Encounter (HOSPITAL_COMMUNITY): Payer: Medicaid Other | Admitting: Anesthesiology

## 2013-08-03 ENCOUNTER — Encounter (HOSPITAL_COMMUNITY): Payer: Medicaid Other

## 2013-08-03 ENCOUNTER — Ambulatory Visit (HOSPITAL_COMMUNITY)
Admission: RE | Admit: 2013-08-03 | Discharge: 2013-08-03 | Disposition: A | Discharge status: Home / Self Care | Payer: Medicaid Other | Source: Ambulatory Visit | Attending: General Surgery | Admitting: General Surgery

## 2013-08-03 ENCOUNTER — Ambulatory Visit (HOSPITAL_COMMUNITY): Payer: Medicaid Other | Admitting: Anesthesiology

## 2013-08-03 DIAGNOSIS — K811 Chronic cholecystitis: Secondary | ICD-10-CM | POA: Insufficient documentation

## 2013-08-03 HISTORY — PX: CHOLECYSTECTOMY: SHX55

## 2013-08-03 SURGERY — LAPAROSCOPIC CHOLECYSTECTOMY
Anesthesia: General | Site: Abdomen | Wound class: Clean Contaminated

## 2013-08-03 MED ORDER — CHLORHEXIDINE GLUCONATE 4 % EX LIQD: 1.0000 "application " | Freq: Once | CUTANEOUS | Status: DC

## 2013-08-03 MED ORDER — DEXAMETHASONE SODIUM PHOSPHATE 4 MG/ML IJ SOLN
4.0000 mg | Freq: Once | INTRAMUSCULAR | Status: AC
  Administered 2013-08-03: 4 mg via INTRAVENOUS

## 2013-08-03 MED ORDER — ROCURONIUM BROMIDE 50 MG/5ML IV SOLN
INTRAVENOUS | Status: AC
  Filled 2013-08-03: qty 1

## 2013-08-03 MED ORDER — FENTANYL CITRATE 0.05 MG/ML IJ SOLN
INTRAMUSCULAR | Status: DC | PRN
  Administered 2013-08-03 (×7): 50 ug via INTRAVENOUS

## 2013-08-03 MED ORDER — MIDAZOLAM HCL 2 MG/2ML IJ SOLN
INTRAMUSCULAR | Status: AC
  Filled 2013-08-03: qty 2

## 2013-08-03 MED ORDER — BUPIVACAINE HCL (PF) 0.5 % IJ SOLN
INTRAMUSCULAR | Status: DC | PRN
  Administered 2013-08-03: 10 mL

## 2013-08-03 MED ORDER — POVIDONE-IODINE 10 % OINT PACKET
TOPICAL_OINTMENT | CUTANEOUS | Status: DC | PRN
  Administered 2013-08-03: 1 via TOPICAL

## 2013-08-03 MED ORDER — ONDANSETRON HCL 4 MG/2ML IJ SOLN: 4.0000 mg | Freq: Once | INTRAMUSCULAR | Status: DC | PRN

## 2013-08-03 MED ORDER — SCOPOLAMINE 1 MG/3DAYS TD PT72
1.0000 | MEDICATED_PATCH | Freq: Once | TRANSDERMAL | Status: DC
  Administered 2013-08-03: 1.5 mg via TRANSDERMAL

## 2013-08-03 MED ORDER — ONDANSETRON HCL 4 MG/2ML IJ SOLN
INTRAMUSCULAR | Status: AC
  Filled 2013-08-03: qty 2

## 2013-08-03 MED ORDER — CLINDAMYCIN PHOSPHATE 600 MG/50ML IV SOLN
600.0000 mg | Freq: Once | INTRAVENOUS | Status: AC
  Administered 2013-08-03: 600 mg via INTRAVENOUS
  Filled 2013-08-03: qty 50

## 2013-08-03 MED ORDER — PROPOFOL 10 MG/ML IV BOLUS
INTRAVENOUS | Status: AC
  Filled 2013-08-03: qty 20

## 2013-08-03 MED ORDER — LIDOCAINE HCL 1 % IJ SOLN
INTRAMUSCULAR | Status: DC | PRN
  Administered 2013-08-03: 30 mg via INTRADERMAL

## 2013-08-03 MED ORDER — SCOPOLAMINE 1 MG/3DAYS TD PT72
MEDICATED_PATCH | TRANSDERMAL | Status: AC
  Filled 2013-08-03: qty 1

## 2013-08-03 MED ORDER — ROCURONIUM BROMIDE 100 MG/10ML IV SOLN
INTRAVENOUS | Status: DC | PRN
  Administered 2013-08-03: 25 mg via INTRAVENOUS
  Administered 2013-08-03: 5 mg via INTRAVENOUS

## 2013-08-03 MED ORDER — NEOSTIGMINE METHYLSULFATE 1 MG/ML IJ SOLN
INTRAMUSCULAR | Status: DC | PRN
  Administered 2013-08-03: 3 mg via INTRAVENOUS

## 2013-08-03 MED ORDER — POVIDONE-IODINE 10 % EX OINT
TOPICAL_OINTMENT | CUTANEOUS | Status: AC
  Filled 2013-08-03: qty 1

## 2013-08-03 MED ORDER — KETOROLAC TROMETHAMINE 30 MG/ML IJ SOLN
INTRAMUSCULAR | Status: AC
  Filled 2013-08-03: qty 1

## 2013-08-03 MED ORDER — MIDAZOLAM HCL 2 MG/2ML IJ SOLN
1.0000 mg | INTRAMUSCULAR | Status: DC | PRN
  Administered 2013-08-03: 2 mg via INTRAVENOUS

## 2013-08-03 MED ORDER — DEXAMETHASONE SODIUM PHOSPHATE 4 MG/ML IJ SOLN
INTRAMUSCULAR | Status: AC
  Filled 2013-08-03: qty 1

## 2013-08-03 MED ORDER — HEMOSTATIC AGENTS (NO CHARGE) OPTIME
TOPICAL | Status: DC | PRN
  Administered 2013-08-03: 1 via TOPICAL

## 2013-08-03 MED ORDER — FENTANYL CITRATE 0.05 MG/ML IJ SOLN
INTRAMUSCULAR | Status: AC
  Filled 2013-08-03: qty 5

## 2013-08-03 MED ORDER — ONDANSETRON HCL 4 MG/2ML IJ SOLN
4.0000 mg | Freq: Once | INTRAMUSCULAR | Status: AC
  Administered 2013-08-03: 4 mg via INTRAVENOUS

## 2013-08-03 MED ORDER — FENTANYL CITRATE 0.05 MG/ML IJ SOLN
INTRAMUSCULAR | Status: AC
  Filled 2013-08-03: qty 2

## 2013-08-03 MED ORDER — GLYCOPYRROLATE 0.2 MG/ML IJ SOLN
INTRAMUSCULAR | Status: DC | PRN
  Administered 2013-08-03: .6 mg via INTRAVENOUS

## 2013-08-03 MED ORDER — LACTATED RINGERS IV SOLN
INTRAVENOUS | Status: DC
  Administered 2013-08-03: 09:00:00 via INTRAVENOUS

## 2013-08-03 MED ORDER — BUPIVACAINE HCL (PF) 0.5 % IJ SOLN
INTRAMUSCULAR | Status: AC
  Filled 2013-08-03: qty 30

## 2013-08-03 MED ORDER — LIDOCAINE HCL (PF) 1 % IJ SOLN
INTRAMUSCULAR | Status: AC
  Filled 2013-08-03: qty 5

## 2013-08-03 MED ORDER — KETOROLAC TROMETHAMINE 30 MG/ML IJ SOLN
30.0000 mg | Freq: Once | INTRAMUSCULAR | Status: AC
  Administered 2013-08-03: 30 mg via INTRAVENOUS

## 2013-08-03 MED ORDER — SODIUM CHLORIDE 0.9 % IR SOLN
Status: DC | PRN
  Administered 2013-08-03: 1000 mL

## 2013-08-03 MED ORDER — FENTANYL CITRATE 0.05 MG/ML IJ SOLN: 25.0000 ug | INTRAMUSCULAR | Status: DC | PRN

## 2013-08-03 MED ORDER — PROPOFOL 10 MG/ML IV BOLUS
INTRAVENOUS | Status: DC | PRN
  Administered 2013-08-03: 150 mg via INTRAVENOUS

## 2013-08-03 SURGICAL SUPPLY — 46 items
APPLIER CLIP LAPSCP 10X32 DD (CLIP) ×2 IMPLANT
BAG HAMPER (MISCELLANEOUS) ×2 IMPLANT
BAG SPEC RTRVL LRG 6X4 10 (ENDOMECHANICALS) ×1
CLOTH BEACON ORANGE TIMEOUT ST (SAFETY) ×2 IMPLANT
COVER LIGHT HANDLE STERIS (MISCELLANEOUS) ×4 IMPLANT
DECANTER SPIKE VIAL GLASS SM (MISCELLANEOUS) ×2 IMPLANT
DURAPREP 26ML APPLICATOR (WOUND CARE) ×2 IMPLANT
ELECT REM PT RETURN 9FT ADLT (ELECTROSURGICAL) ×2
ELECTRODE REM PT RTRN 9FT ADLT (ELECTROSURGICAL) ×1 IMPLANT
FILTER SMOKE EVAC LAPAROSHD (FILTER) ×2 IMPLANT
FORMALIN 10 PREFIL 120ML (MISCELLANEOUS) ×2 IMPLANT
GLOVE BIO SURGEON STRL SZ7.5 (GLOVE) ×2 IMPLANT
GLOVE BIOGEL PI IND STRL 7.0 (GLOVE) IMPLANT
GLOVE BIOGEL PI IND STRL 7.5 (GLOVE) IMPLANT
GLOVE BIOGEL PI IND STRL 8 (GLOVE) ×1 IMPLANT
GLOVE BIOGEL PI INDICATOR 7.0 (GLOVE) ×2
GLOVE BIOGEL PI INDICATOR 7.5 (GLOVE) ×1
GLOVE BIOGEL PI INDICATOR 8 (GLOVE) ×1
GLOVE ECLIPSE 6.5 STRL STRAW (GLOVE) ×1 IMPLANT
GLOVE SS BIOGEL STRL SZ 6.5 (GLOVE) IMPLANT
GLOVE SUPERSENSE BIOGEL SZ 6.5 (GLOVE) ×1
GOWN STRL REIN XL XLG (GOWN DISPOSABLE) ×6 IMPLANT
HEMOSTAT SNOW SURGICEL 2X4 (HEMOSTASIS) ×2 IMPLANT
INST SET LAPROSCOPIC AP (KITS) ×2 IMPLANT
IV NS IRRIG 3000ML ARTHROMATIC (IV SOLUTION) IMPLANT
KIT ROOM TURNOVER APOR (KITS) ×2 IMPLANT
MANIFOLD NEPTUNE II (INSTRUMENTS) ×2 IMPLANT
NDL INSUFFLATION 14GA 120MM (NEEDLE) ×1 IMPLANT
NEEDLE INSUFFLATION 14GA 120MM (NEEDLE) ×2 IMPLANT
NS IRRIG 1000ML POUR BTL (IV SOLUTION) ×2 IMPLANT
PACK LAP CHOLE LZT030E (CUSTOM PROCEDURE TRAY) ×2 IMPLANT
PAD ARMBOARD 7.5X6 YLW CONV (MISCELLANEOUS) ×2 IMPLANT
POUCH SPECIMEN RETRIEVAL 10MM (ENDOMECHANICALS) ×2 IMPLANT
SET BASIN LINEN APH (SET/KITS/TRAYS/PACK) ×2 IMPLANT
SET TUBE IRRIG SUCTION NO TIP (IRRIGATION / IRRIGATOR) IMPLANT
SLEEVE ENDOPATH XCEL 5M (ENDOMECHANICALS) ×2 IMPLANT
SPONGE GAUZE 2X2 8PLY STRL LF (GAUZE/BANDAGES/DRESSINGS) ×8 IMPLANT
STAPLER VISISTAT (STAPLE) ×2 IMPLANT
SUT VICRYL 0 UR6 27IN ABS (SUTURE) ×2 IMPLANT
TAPE CLOTH SURG 4X10 WHT LF (GAUZE/BANDAGES/DRESSINGS) ×1 IMPLANT
TROCAR ENDO BLADELESS 11MM (ENDOMECHANICALS) ×2 IMPLANT
TROCAR XCEL NON-BLD 5MMX100MML (ENDOMECHANICALS) ×2 IMPLANT
TROCAR XCEL UNIV SLVE 11M 100M (ENDOMECHANICALS) ×2 IMPLANT
TUBING INSUFFLATION (TUBING) ×2 IMPLANT
WARMER LAPAROSCOPE (MISCELLANEOUS) ×2 IMPLANT
YANKAUER SUCT 12FT TUBE ARGYLE (SUCTIONS) ×2 IMPLANT

## 2013-08-03 NOTE — Anesthesia Preprocedure Evaluation (Signed)
Anesthesia Evaluation  Patient identified by MRN, date of birth, ID band Patient awake    Reviewed: Allergy & Precautions, H&P , NPO status , Patient's Chart, lab work & pertinent test results  History of Anesthesia Complications (+) PONV and history of anesthetic complications  Airway Mallampati: I TM Distance: >3 FB Neck ROM: Full    Dental  (+) Teeth Intact   Pulmonary former smoker,  breath sounds clear to auscultation        Cardiovascular negative cardio ROS  Rhythm:Regular Rate:Normal     Neuro/Psych  Headaches, Seizures - (once 3 yrs ago), Well Controlled,  PSYCHIATRIC DISORDERS Anxiety Depression    GI/Hepatic negative GI ROS,   Endo/Other    Renal/GU      Musculoskeletal   Abdominal   Peds  Hematology   Anesthesia Other Findings   Reproductive/Obstetrics                           Anesthesia Physical Anesthesia Plan  ASA: II  Anesthesia Plan: General   Post-op Pain Management:    Induction: Intravenous  Airway Management Planned: Oral ETT  Additional Equipment:   Intra-op Plan:   Post-operative Plan: Extubation in OR  Informed Consent: I have reviewed the patients History and Physical, chart, labs and discussed the procedure including the risks, benefits and alternatives for the proposed anesthesia with the patient or authorized representative who has indicated his/her understanding and acceptance.     Plan Discussed with:   Anesthesia Plan Comments:         Anesthesia Quick Evaluation

## 2013-08-03 NOTE — Anesthesia Procedure Notes (Signed)
Procedure Name: Intubation Date/Time: 08/03/2013 10:04 AM Performed by: Glynn Octave E Pre-anesthesia Checklist: Patient identified, Patient being monitored, Timeout performed, Emergency Drugs available and Suction available Patient Re-evaluated:Patient Re-evaluated prior to inductionOxygen Delivery Method: Circle System Utilized Preoxygenation: Pre-oxygenation with 100% oxygen Intubation Type: IV induction Ventilation: Mask ventilation without difficulty Laryngoscope Size: Mac and 3 Grade View: Grade I Tube type: Oral Tube size: 7.0 mm Number of attempts: 1 Airway Equipment and Method: stylet Placement Confirmation: ETT inserted through vocal cords under direct vision,  positive ETCO2 and breath sounds checked- equal and bilateral Secured at: 22 cm Tube secured with: Tape Dental Injury: Teeth and Oropharynx as per pre-operative assessment

## 2013-08-03 NOTE — Interval H&P Note (Signed)
History and Physical Interval Note:  08/03/2013 9:20 AM  [name]  has presented today for surgery, with the diagnosis of chronic cholecystitis  The various methods of treatment have been discussed with the patient and family. After consideration of risks, benefits and other options for treatment, the patient has consented to  Procedure(s): LAPAROSCOPIC CHOLECYSTECTOMY (N/A) as a surgical intervention .  The patient's history has been reviewed, patient examined, no change in status, stable for surgery.  I have reviewed the patient's chart and labs.  Questions were answered to the patient's satisfaction.     Franky Macho A

## 2013-08-03 NOTE — Transfer of Care (Signed)
Immediate Anesthesia Transfer of Care Note  Patient: [name]  Procedure(s) Performed: Procedure(s): LAPAROSCOPIC CHOLECYSTECTOMY (N/A)  Patient Location: PACU  Anesthesia Type:General  Level of Consciousness: awake, alert  and oriented  Airway & Oxygen Therapy: Patient Spontanous Breathing and Patient connected to face mask oxygen  Post-op Assessment: Report given to PACU RN  Post vital signs: Reviewed and stable  Complications: No apparent anesthesia complications

## 2013-08-03 NOTE — Anesthesia Postprocedure Evaluation (Signed)
  Anesthesia Post-op Note  Patient: [name]  Procedure(s) Performed: Procedure(s): LAPAROSCOPIC CHOLECYSTECTOMY (N/A)  Patient Location: PACU  Anesthesia Type:General  Level of Consciousness: awake, alert  and oriented  Airway and Oxygen Therapy: Patient Spontanous Breathing and Patient connected to face mask oxygen  Post-op Pain: mild  Post-op Assessment: Post-op Vital signs reviewed, Patient's Cardiovascular Status Stable, Respiratory Function Stable, Patent Airway and No signs of Nausea or vomiting  Post-op Vital Signs: Reviewed and stable  Complications: No apparent anesthesia complications

## 2013-08-03 NOTE — Op Note (Signed)
Patient:  [name]  DOB:  [birth year]  MRN:  [id number]   Preop Diagnosis:  Chronic cholecystitis  Postop Diagnosis:  Same  Procedure:  Laparoscopic cholecystectomy  Surgeon:  Franky Macho, M.D.  Anes:  General endotracheal  Indications:  Patient is a 37 year old white female presents with biliary colic secondary to chronic cholecystitis. The risks and benefits of the procedure including bleeding, infection, hepatobiliary injury, and the possibility of an open procedure were fully explained to the patient, who gave informed consent.  Procedure note:  The patient is placed the supine position. After induction of general endotracheal anesthesia, the abdomen was prepped and draped using usual sterile technique with DuraPrep. Surgical site confirmation was performed.  A supraumbilical incision was made down to the fascia. A Veress needle was introduced into the abdominal cavity and confirmation of placement was done using the saline drop test. The abdomen was then insufflated to 16 mm mercury pressure. An 11 mm trocar was introduced into the abdominal cavity under direct visualization without difficulty. The patient was placed in reverse Trendelenburg position and additional 11 mm trocar was placed the epigastric region and 5 mm trochars were placed the right upper quadrant right flank regions. The liver was inspected and noted within normal limits. The gallbladder was retracted in a dynamic fashion in order to expose the triangle of Calot. The cystic duct was first identified. Its juncture to the infundibulum was fully identified. Endoclips were placed proximally distally on the cystic duct, and the cystic duct was divided. This was likewise done the cystic artery. The gallbladder was then freed away from the gallbladder fossa using Bovie electrocautery. The gallbladder was delivered through the epigastric trocar site using an Endo Catch bag. The gallbladder fossa was inspected and no abnormal  bleeding or bile leakage was noted. Surgicel is placed the gallbladder fossa. All fluid and air were then evacuated from the abdominal cavity prior to removal of the trochars.  All wounds were irrigated with normal saline. All wounds were checked with 0.5% Sensorcaine. The infraumbilical fascia was reapproximated using an 0 Vicryl interrupted suture. All skin incisions were closed using staples. Betadine ointment and dry sterile dressings were applied.  All tape and needle counts were correct at the end of the procedure. Patient was extubated in the operating room and transferred to PACU in stable condition.  Complications:  None  EBL:  Minimal  Specimen:  Gallbladder

## 2013-08-05 NOTE — Telephone Encounter (Signed)
Pt is aware.  

## 2013-08-08 ENCOUNTER — Encounter (HOSPITAL_COMMUNITY): Payer: Self-pay | Admitting: General Surgery

## 2013-08-10 ENCOUNTER — Other Ambulatory Visit: Payer: Self-pay | Admitting: Family Medicine

## 2013-08-10 NOTE — Telephone Encounter (Signed)
Meds refilled.

## 2013-08-10 NOTE — Telephone Encounter (Signed)
?   Ok to refill, last ov 07/21/13 w/MBD last rf 09/29/11

## 2013-08-10 NOTE — Telephone Encounter (Signed)
Okay to refill? 

## 2013-08-22 ENCOUNTER — Telehealth: Payer: Self-pay | Admitting: Family Medicine

## 2013-08-22 ENCOUNTER — Telehealth: Payer: Self-pay | Admitting: *Deleted

## 2013-08-22 MED ORDER — BUTALBITAL-APAP-CAFF-COD 50-325-40-30 MG PO CAPS: 1.0000 | ORAL_CAPSULE | Freq: Four times a day (QID) | ORAL | Status: DC | PRN

## 2013-08-22 NOTE — Telephone Encounter (Signed)
Noted and taken care of.

## 2013-08-22 NOTE — Telephone Encounter (Signed)
Had seizure Saturday lasting about per her uncle every since then she has had a headache. Wants to know if she can get refill on Fioricet?

## 2013-08-22 NOTE — Telephone Encounter (Signed)
Will give 10 tablets only, no refills

## 2013-08-22 NOTE — Telephone Encounter (Signed)
Pt would like you to call her back  Saturday night she had a seziure first one she had in years [place] back  Number is [phone]

## 2013-08-22 NOTE — Addendum Note (Signed)
Addended by: Milinda Antis F on: 08/22/2013 05:12 PM   Modules accepted: Orders

## 2013-08-23 MED ORDER — BUTALBITAL-APAP-CAFF-COD 50-325-40-30 MG PO CAPS: 1.0000 | ORAL_CAPSULE | Freq: Four times a day (QID) | ORAL | Status: DC | PRN

## 2013-08-23 NOTE — Telephone Encounter (Signed)
I dont treat seizures , she can make an appt

## 2013-08-23 NOTE — Telephone Encounter (Signed)
Pt wants to know if you can give her something to help her with seizures wanting Klonopin family h/x of Seizures with father.

## 2013-08-23 NOTE — Addendum Note (Signed)
Addended by: Elvina Mattes T on: 08/23/2013 05:00 PM   Modules accepted: Orders

## 2013-08-24 NOTE — Telephone Encounter (Signed)
Pt is aware needs to make appt to be seen for medication Klonopin and med refilled

## 2013-08-30 ENCOUNTER — Ambulatory Visit (INDEPENDENT_AMBULATORY_CARE_PROVIDER_SITE_OTHER): Payer: Medicaid Other | Admitting: Family Medicine

## 2013-08-30 ENCOUNTER — Encounter: Payer: Self-pay | Admitting: Family Medicine

## 2013-08-30 VITALS — BP 122/72 | HR 72 | Temp 98.5°F | Resp 18 | Wt 166.0 lb

## 2013-08-30 DIAGNOSIS — R569 Unspecified convulsions: Secondary | ICD-10-CM

## 2013-08-30 DIAGNOSIS — F1310 Sedative, hypnotic or anxiolytic abuse, uncomplicated: Secondary | ICD-10-CM

## 2013-08-30 DIAGNOSIS — G43009 Migraine without aura, not intractable, without status migrainosus: Secondary | ICD-10-CM

## 2013-08-30 DIAGNOSIS — F1110 Opioid abuse, uncomplicated: Secondary | ICD-10-CM

## 2013-08-30 MED ORDER — BUTALBITAL-APAP-CAFFEINE 50-325-40 MG PO TABS: 1.0000 | ORAL_TABLET | Freq: Four times a day (QID) | ORAL | Status: DC | PRN

## 2013-08-30 NOTE — Patient Instructions (Signed)
Referral to neurology in [facility] Migraine meds given F/U 4 months

## 2013-08-31 DIAGNOSIS — F1110 Opioid abuse, uncomplicated: Secondary | ICD-10-CM | POA: Insufficient documentation

## 2013-08-31 NOTE — Assessment & Plan Note (Signed)
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 Lovell Sheehan

## 2013-08-31 NOTE — Assessment & Plan Note (Signed)
No further narcotic medications, due to abuse of meds Given fiorcet with caffiene only declines any other meds such as imitrex, topamax, propranolol

## 2013-08-31 NOTE — Assessment & Plan Note (Addendum)
Known seizure disorder, had recurrent seizure, declines restarting seizure meds, will refer to neurology for medication management She wants to be on klonopin and I do not see a reason to give her this for seizures as there are better meds and there is already concern for narcotic abuse and in the past had failed UDS with benzo's not prescribed

## 2013-08-31 NOTE — Progress Notes (Signed)
   Subjective:    Patient ID: [name], female    DOB: [birth year], 37 y.o.   MRN: [id number]  HPI  Pt here to f/u medications. History of seizure disorder she stopped her Keppra 2 years ago on her own. States it gave her side effects and only klonopin helps. Her father had seizure disorder and he suffered many SE from seizure meds so was placed on klonopin. She had a seizure about 2 weeks ago, did not go to ER, states uncle did not think she needed to go, post-ictal for 30 minutes but had migraine for past 3 days since.  Reviewed [place] [facility] database- she received 140 tablets of Fioricet with codine from 10-1 to 10/30 she would get a refill every 3 days. States she did not know she was getting it as often She has also used 115tablets of percocet given by her surgeon - Dr. Lovell Sheehan who removed her gallbladder recently. Given 15 tabs on 11/23, 50 tabs 11/25 and 50 tabs 12/1   Review of Systems  GEN- denies fatigue, fever, weight loss,weakness, recent illness HEENT- denies eye drainage, change in vision, nasal discharge, CVS- denies chest pain, palpitations RESP- denies SOB, cough, wheeze ABD- denies N/V, change in stools, abd pain GU- denies dysuria, hematuria, dribbling, incontinence MSK- +joint pain, muscle aches, injury Neuro- +headache, dizziness, syncope, +seizure activity      Objective:   Physical Exam GEN- NAD, alert and oriented x3 HEENT- PERRL, EOMI, non injected sclera, pink conjunctiva, MMM, oropharynx clear Neck- Supple,  CVS- RRR, no murmur RESP-CTAB ABD-NABS,soft,NT,ND, port scars d/c/i EXT- No edema Pulses- Radial 2+ NEURO- CNII-XII in tact, no deficits Psych- normal affect and mood, good eye contact        Assessment & Plan:

## 2013-09-05 ENCOUNTER — Telehealth: Payer: Self-pay | Admitting: Family Medicine

## 2013-09-05 MED ORDER — TRAMADOL HCL 50 MG PO TABS: 50.0000 mg | ORAL_TABLET | Freq: Three times a day (TID) | ORAL | Status: DC | PRN

## 2013-09-05 NOTE — Telephone Encounter (Signed)
Pt would like to have enough Tramadol until she gets in to see Neuro. She has been taking them for 2 years and does not want to stop cold [place]. If no what is she suppose to do about her back spasms until neuro appt.

## 2013-09-05 NOTE — Telephone Encounter (Signed)
Requesting refill of Tramadol??  Not on med list?

## 2013-09-05 NOTE — Telephone Encounter (Signed)
LMTRC

## 2013-09-05 NOTE — Telephone Encounter (Signed)
Please let pt know she already received pain medication from Dr. Lovell Sheehan - I discussed this with her at the visit That means she was taking Fiorocet with codiene, hydrocodone and ultram if she was taking all of the medications. She picked up a script for ultram 180 tablets on 12/3  I will give her only 60 tablets of ultram, none further.

## 2013-09-05 NOTE — Telephone Encounter (Signed)
Pt is needing a refill on her Tramadol  She will run out on the 2nd and she is wanting to get a refill since we are closed on Saturday  Call back number is [phone] Pt would like a call either way

## 2013-09-05 NOTE — Telephone Encounter (Signed)
.  Patient aware of no further refills and process for referral to neuro.

## 2013-09-05 NOTE — Telephone Encounter (Signed)
Medication declined, she was just here for a visit, I advised her I am not giving her any controlled substances. This includes tramadol

## 2013-09-06 ENCOUNTER — Telehealth: Payer: Self-pay | Admitting: Family Medicine

## 2013-09-06 NOTE — Telephone Encounter (Signed)
req refill Tramadol  Last Rf yesterday to [facility]  Refill denied

## 2013-09-26 ENCOUNTER — Telehealth: Payer: Self-pay | Admitting: Family Medicine

## 2013-09-26 NOTE — Telephone Encounter (Signed)
LMTRC

## 2013-09-26 NOTE — Telephone Encounter (Signed)
Pt returned my call and she is wanting a refill on Fioricet I looked in her med list and it shows that she has 1 refill left but claims that [facility] does not have the refill, I called [facility] and it was confirmed do not have. Ok to refill?

## 2013-09-26 NOTE — Telephone Encounter (Signed)
[facility]  Call back number is [phone]

## 2013-09-27 NOTE — Telephone Encounter (Signed)
Given 45 tablets no refills, make sure it is the one listed that does not contain codeine Must last 30 days

## 2013-09-29 MED ORDER — BUTALBITAL-APAP-CAFFEINE 50-325-40 MG PO TABS: 1.0000 | ORAL_TABLET | Freq: Four times a day (QID) | ORAL | Status: AC | PRN

## 2013-09-29 NOTE — Telephone Encounter (Signed)
Med refilled and ready for approval

## 2013-10-09 ENCOUNTER — Encounter (HOSPITAL_COMMUNITY): Payer: Self-pay | Admitting: Emergency Medicine

## 2013-10-09 ENCOUNTER — Emergency Department (HOSPITAL_COMMUNITY): Payer: Medicaid Other

## 2013-10-09 ENCOUNTER — Emergency Department (HOSPITAL_COMMUNITY)
Admission: EM | Admit: 2013-10-09 | Discharge: 2013-10-09 | Disposition: A | Discharge status: Home / Self Care | Payer: Medicaid Other | Attending: Emergency Medicine | Admitting: Emergency Medicine

## 2013-10-09 ENCOUNTER — Other Ambulatory Visit: Payer: Self-pay | Admitting: Family Medicine

## 2013-10-09 DIAGNOSIS — W010XXA Fall on same level from slipping, tripping and stumbling without subsequent striking against object, initial encounter: Secondary | ICD-10-CM | POA: Insufficient documentation

## 2013-10-09 DIAGNOSIS — G43909 Migraine, unspecified, not intractable, without status migrainosus: Secondary | ICD-10-CM

## 2013-10-09 DIAGNOSIS — S9000XA Contusion of unspecified ankle, initial encounter: Secondary | ICD-10-CM | POA: Insufficient documentation

## 2013-10-09 DIAGNOSIS — S82899A Other fracture of unspecified lower leg, initial encounter for closed fracture: Secondary | ICD-10-CM | POA: Insufficient documentation

## 2013-10-09 DIAGNOSIS — G40909 Epilepsy, unspecified, not intractable, without status epilepticus: Secondary | ICD-10-CM

## 2013-10-09 DIAGNOSIS — Z8781 Personal history of (healed) traumatic fracture: Secondary | ICD-10-CM | POA: Insufficient documentation

## 2013-10-09 DIAGNOSIS — S82401A Unspecified fracture of shaft of right fibula, initial encounter for closed fracture: Secondary | ICD-10-CM

## 2013-10-09 DIAGNOSIS — R609 Edema, unspecified: Secondary | ICD-10-CM | POA: Insufficient documentation

## 2013-10-09 DIAGNOSIS — Z8679 Personal history of other diseases of the circulatory system: Secondary | ICD-10-CM | POA: Insufficient documentation

## 2013-10-09 DIAGNOSIS — Y929 Unspecified place or not applicable: Secondary | ICD-10-CM | POA: Insufficient documentation

## 2013-10-09 DIAGNOSIS — Z880 Allergy status to penicillin: Secondary | ICD-10-CM | POA: Insufficient documentation

## 2013-10-09 DIAGNOSIS — Z87891 Personal history of nicotine dependence: Secondary | ICD-10-CM | POA: Insufficient documentation

## 2013-10-09 DIAGNOSIS — R0989 Other specified symptoms and signs involving the circulatory and respiratory systems: Secondary | ICD-10-CM | POA: Insufficient documentation

## 2013-10-09 DIAGNOSIS — Y939 Activity, unspecified: Secondary | ICD-10-CM | POA: Insufficient documentation

## 2013-10-09 DIAGNOSIS — Z8742 Personal history of other diseases of the female genital tract: Secondary | ICD-10-CM | POA: Insufficient documentation

## 2013-10-09 DIAGNOSIS — Z8669 Personal history of other diseases of the nervous system and sense organs: Secondary | ICD-10-CM | POA: Insufficient documentation

## 2013-10-09 DIAGNOSIS — Z8659 Personal history of other mental and behavioral disorders: Secondary | ICD-10-CM | POA: Insufficient documentation

## 2013-10-09 MED ORDER — FENTANYL CITRATE 0.05 MG/ML IJ SOLN
50.0000 ug | Freq: Once | INTRAMUSCULAR | Status: AC
  Administered 2013-10-09: 22:00:00 via INTRAMUSCULAR

## 2013-10-09 MED ORDER — OXYCODONE-ACETAMINOPHEN 5-325 MG PO TABS
ORAL_TABLET | ORAL | Status: DC
Start: 2013-10-09 — End: 2014-06-09

## 2013-10-09 MED ORDER — FENTANYL CITRATE 0.05 MG/ML IJ SOLN
50.0000 ug | Freq: Once | INTRAMUSCULAR | Status: AC
Start: 2013-10-09 — End: 2013-10-09

## 2013-10-09 MED ORDER — FENTANYL CITRATE 0.05 MG/ML IJ SOLN
INTRAMUSCULAR | Status: AC
  Filled 2013-10-09: qty 2

## 2013-10-09 MED ORDER — ONDANSETRON 4 MG PO TBDP
4.0000 mg | ORAL_TABLET | Freq: Once | ORAL | Status: AC
  Administered 2013-10-09: 4 mg via ORAL
  Filled 2013-10-09: qty 1

## 2013-10-09 NOTE — Discharge Instructions (Signed)
Elevate your foot. Use the ice packs to keep the swelling and pain down. Wear the splint until seen by Dr Romeo AppleHarrison. Call his office in the morning to get an appointment to be seen this week. You have a "distal fibular fracture". Use the crutches.

## 2013-10-09 NOTE — ED Provider Notes (Signed)
CSN: [id number]     Arrival date & time 10/09/13  2053 History   This chart was scribed for Ward Givens, MD by Blanchard Kelch, ED Scribe. The patient was seen in room APA18/APA18. Patient's care was started at 9:41 PM.    Chief Complaint  Patient presents with  . Ankle Injury    Patient is a 38 y.o. female presenting with lower extremity injury. The history is provided by the patient. No language interpreter was used.  Ankle Injury    HPI Comments: [name] is a 38 y.o. female who presents to the Emergency Department complaining of a right ankle injury that occurred a few hours ago tonight. She states that she slipped on a rug and fell while getting out of a bed. She denies hitting her head or losing consciousness. She is complaining of constant pain to her right ankle onset immediately after the fall occurred. She has associated swelling to the affected area. She denies pain to any other area.   PCP was Dr. Jeanice Lim but she is trying to switch providers to Dr Sudie Bailey.  Past Medical History  Diagnosis Date  . Migraine   . Neck fracture     C1  . DJD (degenerative joint disease), lumbar   . Lymph node abscess     s/p removal  . Ovarian cyst     Followed by GYN Dr. Ralph Dowdy  . Anxiety   . Depression   . PONV (postoperative nausea and vomiting)   . Seizures     1 seizure 3 years ago-with trauma   Past Surgical History  Procedure Laterality Date  . Back surgery    . Tonsillectomy    . Tubal ligation    . Colposcopy      CIN-1 Dr. Ralph Dowdy (GYN)/ HPV  . Cholecystectomy N/A 08/03/2013    Procedure: LAPAROSCOPIC CHOLECYSTECTOMY;  Surgeon: Dalia Heading, MD;  Location: [facility];  Service: General;  Laterality: N/A;   Family History  Problem Relation Age of Onset  . Heart disease Father     MI  . Depression Father   . Hyperlipidemia Mother   . Heart disease Maternal Grandfather   . Hyperlipidemia Maternal Grandfather   . Cancer Maternal Grandfather     Colon cancer   History   Substance Use Topics  . Smoking status: Former Smoker -- 0.50 packs/day for 10 years    Quit date: 08/02/2005  . Smokeless tobacco: Not on file  . Alcohol Use: Yes     Comment: rarely   Lives with her aunt  OB History   Grav Para Term Preterm Abortions TAB SAB Ect Mult Living                 Review of Systems  Constitutional: Negative for fever.  Musculoskeletal: Positive for joint swelling.       Positive for ankle pain, left.  Neurological: Negative for syncope.  All other systems reviewed and are negative.    Allergies  Darvocet and Penicillins  Home Medications   Current Outpatient Rx  Name  Route  Sig  Dispense  Refill  . butalbital-acetaminophen-caffeine (FIORICET) 50-325-40 MG per tablet   Oral   Take 1 tablet by mouth every 6 (six) hours as needed for headache.   45 tablet   0    Triage Vitals: BP 120/96  Pulse 96  Temp(Src) 97.7 F (36.5 C) (Oral)  Resp 20  Ht 5\' 6"  (1.676 m)  Wt 160 lb (72.576 kg)  BMI 25.84 kg/m2  SpO2 100%  LMP 09/27/2013  Vital signs normal    Physical Exam  Nursing note and vitals reviewed. Constitutional: She is oriented to person, place, and time. She appears well-developed and well-nourished.  Non-toxic appearance. She does not appear ill. No distress.  HENT:  Head: Normocephalic and atraumatic.  Right Ear: External ear normal.  Left Ear: External ear normal.  Nose: Nose normal. No mucosal edema or rhinorrhea.  Mouth/Throat: Oropharynx is clear and moist and mucous membranes are normal. No dental abscesses or uvula swelling.  Eyes: Conjunctivae and EOM are normal. Pupils are equal, round, and reactive to light.  Neck: Normal range of motion and full passive range of motion without pain. Neck supple.  Cardiovascular: Exam reveals friction rub.   Pulmonary/Chest: Effort normal and breath sounds normal. No respiratory distress. She has no rhonchi. She exhibits no crepitus.  Abdominal: Soft. Normal appearance and bowel  sounds are normal. She exhibits no distension. There is no tenderness. There is no rebound and no guarding.  Musculoskeletal: Normal range of motion. She exhibits edema and tenderness.   Moderate swelling and bruising of lateral malleolus of right ankle. Intact sensation and pulses. Right knee non tender and not swollen.   Neurological: She is alert and oriented to person, place, and time. She has normal strength. No cranial nerve deficit.  Skin: Skin is warm, dry and intact. No rash noted. No erythema. No pallor.  Psychiatric: She has a normal mood and affect. Her speech is normal and behavior is normal. Her mood appears not anxious.    ED Course  Procedures (including critical care time)  Medications  fentaNYL (SUBLIMAZE) injection 50 mcg ( Intramuscular Duplicate 10/09/13 2213)  fentaNYL (SUBLIMAZE) injection 50 mcg ( Intramuscular Given 10/09/13 2149)  ondansetron (ZOFRAN-ODT) disintegrating tablet 4 mg (4 mg Oral Given 10/09/13 2153)    DIAGNOSTIC STUDIES: Oxygen Saturation is 100% on room air, normal by my interpretation.    COORDINATION OF CARE: 9:44 PM -Reviewed x-ray with the patient. Will place in splint and have patient follow up with Orthopedics. Patient verbalizes understanding and agrees with treatment plan.    Imaging Review Dg Ankle Complete Right  10/09/2013   CLINICAL DATA:  Larey SeatFell today with lateral ankle pain  EXAM: RIGHT ANKLE - COMPLETE 3+ VIEW  COMPARISON:  03/31/2011  FINDINGS: Significant lateral soft tissue swelling. Comminuted minimally displaced fracture involving the distal shaft of the fibula. The mortise is intact.  IMPRESSION: Distal fibular fracture.   Electronically Signed   By: Esperanza Heiraymond  Rubner M.D.   On: 10/09/2013 21:49    EKG Interpretation   None       MDM   1. Right fibular fracture    Discharge Medication List as of 10/09/2013 10:30 PM    START taking these medications   Details  oxyCODONE-acetaminophen (PERCOCET/ROXICET) 5-325 MG per tablet  Take 1 or 2 po Q 6hrs for pain, Print        Plan discharge   Devoria AlbeIva Kinta Martis, MD, FACEP   I personally performed the services described in this documentation, which was scribed in my presence. The recorded information has been reviewed and considered.  Devoria AlbeIva Sydnei Ohaver, MD, FACEP    Ward GivensIva L Liliah Dorian, MD 10/10/13 [phone]

## 2013-10-09 NOTE — ED Notes (Signed)
Crutches given, pt demonstrates and understand how to use.

## 2013-10-09 NOTE — ED Notes (Signed)
Pt fell has large hematoma and swelling to right ankle.

## 2013-10-09 NOTE — ED Notes (Signed)
Splint applied to right ankle. Assisted by RN Leitha Bleakebecca Cockram.

## 2013-10-09 NOTE — ED Notes (Signed)
Patient given discharge instruction, verbalized understand.  Patient in wheelchair out of the department.  

## 2013-10-09 NOTE — ED Notes (Addendum)
Pt tripped and fell hurting her right ankle. Good pulses in right foot. 2nd toe on right foot is purple. Good cap refill. Swelling noted to right ankle.

## 2013-10-11 ENCOUNTER — Ambulatory Visit (INDEPENDENT_AMBULATORY_CARE_PROVIDER_SITE_OTHER): Payer: Medicaid Other | Admitting: Orthopedic Surgery

## 2013-10-11 ENCOUNTER — Telehealth: Payer: Self-pay | Admitting: Orthopedic Surgery

## 2013-10-11 ENCOUNTER — Encounter: Payer: Self-pay | Admitting: Orthopedic Surgery

## 2013-10-11 VITALS — BP 121/87 | Ht 66.5 in | Wt 169.0 lb

## 2013-10-11 DIAGNOSIS — S82401A Unspecified fracture of shaft of right fibula, initial encounter for closed fracture: Secondary | ICD-10-CM

## 2013-10-11 DIAGNOSIS — S82409A Unspecified fracture of shaft of unspecified fibula, initial encounter for closed fracture: Secondary | ICD-10-CM

## 2013-10-11 MED ORDER — HYDROCODONE-ACETAMINOPHEN 7.5-325 MG PO TABS: 1.0000 | ORAL_TABLET | ORAL | Status: DC | PRN

## 2013-10-11 NOTE — Telephone Encounter (Signed)
Per patient's discussion with Dr Romeo AppleHarrison at time of office visit today, 10/11/13, patient has been approved to have 1 appointed contact person to pick up her refills of pain medication when ordered, due to her not driving.  Contact is patient's uncle, Dahlia ByesChris Grogan, same phone # as patient's, [phone].  Advised of need for photo ID's as well.

## 2013-10-11 NOTE — Progress Notes (Signed)
   Subjective:    Patient ID: [name], female    DOB: June 18, 1976, 38 y.o.   MRN: [id number]  Ankle Pain  The incident occurred 12 to 24 hours ago (10/09/2013). The incident occurred at home. The injury mechanism was a fall. The pain is present in the right ankle. The quality of the pain is described as aching, burning and stabbing. The pain is at a severity of 9/10. The pain has been constant since onset. Associated symptoms include an inability to bear weight, a loss of motion and tingling. Associated symptoms comments: swell.      Review of Systems  Gastrointestinal:       Heartburn  Neurological: Positive for tingling.  All other systems reviewed and are negative.   flank pain hematuria itching seizure numbness anxiety easy bruising seasonal allergies     Objective:   Physical Exam BP 121/87  Ht 5' 6.5" (1.689 m)  Wt 169 lb (76.658 kg)  BMI 26.87 kg/m2  LMP 09/27/2013 General appearance is normal, the patient is alert and oriented x3 with normal mood and affect. Nonweightbearing gait with crutches posterior splint Evaluation of the right foot and ankle shows swelling, decreased range of motion but I can get the foot in neutral position, stability test could not be performed. Motor exam cannot be performed muscle tone normal skin was intact tenderness over the fibula swelling around the ankle joint. Sensation was normal in the foot normal pulses were noted. There were no pathologic reflexes.       Assessment & Plan:  X-ray shows a spiral fibular fracture with an intact mortise of the right ankle  Recommend short leg nonweightbearing cast x3 weeks then return for x-ray out of plaster and recast

## 2013-10-11 NOTE — Patient Instructions (Addendum)
Use crutchces   No weight bearing x 3 weeks   REFILL MEDICATION WEEKLY AS NEEDED

## 2013-10-12 ENCOUNTER — Other Ambulatory Visit (HOSPITAL_COMMUNITY): Payer: Medicaid Other

## 2013-10-17 ENCOUNTER — Telehealth: Payer: Self-pay | Admitting: Orthopedic Surgery

## 2013-10-17 NOTE — Telephone Encounter (Signed)
Routing to DR. Harrison  

## 2013-10-17 NOTE — Telephone Encounter (Signed)
Patient called, requests refill of pain medication, Hydrocodone/Norco 7.5-325.  Refer to phone note in which patient approved by Dr Romeo AppleHarrison for family member, uncle, to pick up. Patient ph# [phone].

## 2013-10-17 NOTE — Telephone Encounter (Signed)
refill 

## 2013-10-18 ENCOUNTER — Other Ambulatory Visit: Payer: Self-pay | Admitting: *Deleted

## 2013-10-18 DIAGNOSIS — S82401A Unspecified fracture of shaft of right fibula, initial encounter for closed fracture: Secondary | ICD-10-CM

## 2013-10-18 MED ORDER — HYDROCODONE-ACETAMINOPHEN 7.5-325 MG PO TABS: 1.0000 | ORAL_TABLET | ORAL | Status: DC | PRN

## 2013-10-18 NOTE — Telephone Encounter (Signed)
Notified patient, prescription ready for pick up-done.

## 2013-10-25 ENCOUNTER — Ambulatory Visit: Payer: Medicaid Other | Admitting: Orthopedic Surgery

## 2013-10-25 ENCOUNTER — Telehealth: Payer: Self-pay | Admitting: Orthopedic Surgery

## 2013-10-26 ENCOUNTER — Telehealth: Payer: Self-pay | Admitting: *Deleted

## 2013-10-26 ENCOUNTER — Other Ambulatory Visit: Payer: Self-pay | Admitting: *Deleted

## 2013-10-26 DIAGNOSIS — S82401A Unspecified fracture of shaft of right fibula, initial encounter for closed fracture: Secondary | ICD-10-CM

## 2013-10-26 MED ORDER — HYDROCODONE-ACETAMINOPHEN 7.5-325 MG PO TABS: 1.0000 | ORAL_TABLET | ORAL | Status: DC | PRN

## 2013-10-26 NOTE — Telephone Encounter (Signed)
Patient aware prescription is ready to be picked up.

## 2013-10-26 NOTE — Telephone Encounter (Signed)
REFILL 

## 2013-10-26 NOTE — Telephone Encounter (Signed)
Patient came in today to pick up prescription for pain medicine. Patient was scheduled for an appointment to have her cast checked for 10/27/13. Patient thought her cast was too loose, but now states her leg has swelled some and she thought it was fine, and she wanted to cancel her appointment. I went out and checked her cast, and I was able to still get my finger down inside, so I advised her to keep her regular appointment. We cancelled the appointment for 10/27/13.

## 2013-10-27 ENCOUNTER — Ambulatory Visit: Payer: Medicaid Other | Admitting: Orthopedic Surgery

## 2013-11-01 ENCOUNTER — Telehealth: Payer: Self-pay | Admitting: Orthopedic Surgery

## 2013-11-01 ENCOUNTER — Ambulatory Visit: Payer: Medicaid Other | Admitting: Orthopedic Surgery

## 2013-11-01 NOTE — Telephone Encounter (Signed)
Patient called back regarding our message that we are cancelling appointments today due to severe weather; she states she is running out of her pain medication and has enough for tomorrow only.  Advised we can schedule for tomorrow or Thursday, but pending weather.  Please advise regarding refill on:   HYDROcodone-acetaminophen (NORCO) 7.5-325 MG per tablet [[id number]][[id number]].  Patient also is requesting to add her aunt in addition to her uncle, to be able to pick up her prescription, due to "safety of her getting out."  Her best call back cell # is [phone].

## 2013-11-02 ENCOUNTER — Other Ambulatory Visit: Payer: Self-pay | Admitting: *Deleted

## 2013-11-02 DIAGNOSIS — S82401A Unspecified fracture of shaft of right fibula, initial encounter for closed fracture: Secondary | ICD-10-CM

## 2013-11-02 MED ORDER — HYDROCODONE-ACETAMINOPHEN 7.5-325 MG PO TABS: 1.0000 | ORAL_TABLET | ORAL | Status: DC | PRN

## 2013-11-02 NOTE — Telephone Encounter (Signed)
Patient has called back to check on status (see note 11/01/13) of both (1)pain medication refill and(2) the re-schedule of her appointment from yesterday, 11/01/13(severe weather resulted in cancellation).  I relayed to patient that Dr Romeo AppleHarrison is still at hospital and not yet in office to address.  She states her toes are turning blue, and that she is getting concerned about her cast, appointment, and "whether she is looking at needing to have surgery or not."  Please advise.

## 2013-11-02 NOTE — Telephone Encounter (Signed)
Dr. Romeo AppleHarrison she is coming in for nurse check, but please advise on medication refill for Hydrocodone 7.5/325 mg. Thanks

## 2013-11-02 NOTE — Telephone Encounter (Signed)
Patient came in cast looked fine, not too tight or loose. Noted some bruising to the top of her toes. Dr. Romeo AppleHarrison spoke with patient and refilled pain medication.

## 2013-11-02 NOTE — Telephone Encounter (Signed)
refill 

## 2013-11-07 ENCOUNTER — Ambulatory Visit (INDEPENDENT_AMBULATORY_CARE_PROVIDER_SITE_OTHER): Payer: Medicaid Other

## 2013-11-07 ENCOUNTER — Encounter: Payer: Self-pay | Admitting: Orthopedic Surgery

## 2013-11-07 ENCOUNTER — Ambulatory Visit (INDEPENDENT_AMBULATORY_CARE_PROVIDER_SITE_OTHER): Payer: Medicaid Other | Admitting: Orthopedic Surgery

## 2013-11-07 VITALS — BP 120/82 | Ht 66.5 in | Wt 169.0 lb

## 2013-11-07 DIAGNOSIS — S82409A Unspecified fracture of shaft of unspecified fibula, initial encounter for closed fracture: Secondary | ICD-10-CM

## 2013-11-07 DIAGNOSIS — S82891A Other fracture of right lower leg, initial encounter for closed fracture: Secondary | ICD-10-CM

## 2013-11-07 DIAGNOSIS — S82899A Other fracture of unspecified lower leg, initial encounter for closed fracture: Secondary | ICD-10-CM

## 2013-11-07 DIAGNOSIS — S82401A Unspecified fracture of shaft of right fibula, initial encounter for closed fracture: Secondary | ICD-10-CM

## 2013-11-07 MED ORDER — HYDROCODONE-ACETAMINOPHEN 7.5-325 MG PO TABS: 1.0000 | ORAL_TABLET | ORAL | Status: DC | PRN

## 2013-11-07 NOTE — Patient Instructions (Signed)
WBAT  CAST X 5 WEEKS THEN X RAY OOP

## 2013-11-07 NOTE — Progress Notes (Signed)
Patient ID: [name], female   DOB: 07-28-1976, 38 y.o.   MRN: [id number]  Chief Complaint  Patient presents with  . Follow-up    3 week recheck right ankle fracture DOI 10/09/13    XRAYS STABLE LAT MALL FRACTURE   RECAST   X 5 WEEKS   WBAT

## 2013-11-21 ENCOUNTER — Telehealth: Payer: Self-pay | Admitting: Orthopedic Surgery

## 2013-11-21 NOTE — Telephone Encounter (Signed)
[name] asked for a prescription for Hydrocodone 7.5/325

## 2013-11-21 NOTE — Telephone Encounter (Signed)
Routing to Dr Harrison 

## 2013-11-22 NOTE — Telephone Encounter (Signed)
Patient called again,stating she will be out of pain medicine(hydrocodone 7.5/325 mg) in the morning 11/23/13. She is also complaining of a  pulling feeling in the back of the knee. Please advise.

## 2013-11-23 ENCOUNTER — Other Ambulatory Visit: Payer: Self-pay | Admitting: *Deleted

## 2013-11-23 MED ORDER — HYDROCODONE-ACETAMINOPHEN 5-325 MG PO TABS: 1.0000 | ORAL_TABLET | ORAL | Status: DC | PRN

## 2013-11-23 NOTE — Telephone Encounter (Signed)
NORCO 5 MG  Q4 PRN  42

## 2013-11-23 NOTE — Telephone Encounter (Signed)
Prescription done and patient aware to pick up Thursday 11/24/13

## 2013-11-29 ENCOUNTER — Other Ambulatory Visit: Payer: Self-pay | Admitting: Orthopedic Surgery

## 2013-11-29 ENCOUNTER — Telehealth: Payer: Self-pay | Admitting: Orthopedic Surgery

## 2013-11-29 MED ORDER — HYDROCODONE-ACETAMINOPHEN 5-325 MG PO TABS: 1.0000 | ORAL_TABLET | Freq: Four times a day (QID) | ORAL | Status: AC | PRN

## 2013-11-29 NOTE — Telephone Encounter (Signed)
Routing to DR. Harrison  

## 2013-11-29 NOTE — Telephone Encounter (Signed)
Refilled per Dr. Romeo AppleHarrison and patient aware to pick up Thursday.

## 2013-11-29 NOTE — Telephone Encounter (Signed)
Anthoney HaradaRuth Bunch left a message she needs a prescription for Hydrocodone 5/325. Will be out by  Thursday and will pick up Thursday 12/01/13 Her phone # [phone]

## 2013-12-13 ENCOUNTER — Telehealth: Payer: Self-pay | Admitting: Orthopedic Surgery

## 2013-12-13 NOTE — Telephone Encounter (Signed)
Routing to Dr Harrison 

## 2013-12-13 NOTE — Telephone Encounter (Signed)
[name] asked for a prescription for Hydrocodone 5/325  Her # [phone]

## 2013-12-19 ENCOUNTER — Ambulatory Visit: Payer: Medicaid Other | Admitting: Orthopedic Surgery

## 2013-12-20 ENCOUNTER — Ambulatory Visit (INDEPENDENT_AMBULATORY_CARE_PROVIDER_SITE_OTHER): Payer: Medicaid Other

## 2013-12-20 ENCOUNTER — Ambulatory Visit (INDEPENDENT_AMBULATORY_CARE_PROVIDER_SITE_OTHER): Payer: Self-pay | Admitting: Orthopedic Surgery

## 2013-12-20 ENCOUNTER — Encounter: Payer: Self-pay | Admitting: Orthopedic Surgery

## 2013-12-20 VITALS — BP 113/74 | Ht 66.5 in | Wt 169.0 lb

## 2013-12-20 DIAGNOSIS — S82891A Other fracture of right lower leg, initial encounter for closed fracture: Secondary | ICD-10-CM

## 2013-12-20 DIAGNOSIS — S82899A Other fracture of unspecified lower leg, initial encounter for closed fracture: Secondary | ICD-10-CM

## 2013-12-20 MED ORDER — HYDROCODONE-ACETAMINOPHEN 5-325 MG PO TABS: 1.0000 | ORAL_TABLET | Freq: Four times a day (QID) | ORAL | Status: DC | PRN

## 2013-12-20 NOTE — Telephone Encounter (Signed)
Got prescription at office visit.

## 2013-12-20 NOTE — Progress Notes (Signed)
Patient ID: Robina AdeRuth A Mcneil, female   DOB: 1975/12/21, 38 y.o.   MRN: [id number]  Chief Complaint  Patient presents with  . Follow-up    5 week recheck on right ankle fracture with xray OOP. DOI 10-09-13.   Meds ordered this encounter  Medications  . HYDROcodone-acetaminophen (NORCO/VICODIN) 5-325 MG per tablet    Sig: Take 1 tablet by mouth every 6 (six) hours as needed for moderate pain.    Dispense:  56 tablet    Refill:  0    xrays fracture consolidation noted   Cast off wbat f/u 1 month

## 2013-12-20 NOTE — Patient Instructions (Signed)
Wear Tennis Shoes for the next couple of weeks

## 2013-12-26 ENCOUNTER — Ambulatory Visit: Payer: Medicaid Other | Admitting: Orthopedic Surgery

## 2014-01-02 ENCOUNTER — Telehealth: Payer: Self-pay | Admitting: Orthopedic Surgery

## 2014-01-02 NOTE — Telephone Encounter (Signed)
Patient called to request refill on pain medication, Hydrocodone 5-325, and to relay that she is still having swelling at the fracture site; states has been wearing tennis shoes, and bearing weight as tolerated, as advised at last office visit.  She is also trying to elevate and ice as needed.  Please call to advise at updated phone #[phone].

## 2014-01-03 ENCOUNTER — Other Ambulatory Visit: Payer: Self-pay | Admitting: Orthopedic Surgery

## 2014-01-03 DIAGNOSIS — S82891A Other fracture of right lower leg, initial encounter for closed fracture: Secondary | ICD-10-CM

## 2014-01-03 MED ORDER — HYDROCODONE-ACETAMINOPHEN 5-325 MG PO TABS: 1.0000 | ORAL_TABLET | Freq: Four times a day (QID) | ORAL | Status: DC | PRN

## 2014-01-03 NOTE — Telephone Encounter (Signed)
Routing to Dr Harrison 

## 2014-01-03 NOTE — Telephone Encounter (Signed)
Dr. Romeo AppleHarrison refilled ,and patient picked up prescription 01/03/14.

## 2014-01-19 ENCOUNTER — Ambulatory Visit: Payer: Medicaid Other | Admitting: Orthopedic Surgery

## 2014-01-31 ENCOUNTER — Ambulatory Visit: Payer: Medicaid Other

## 2014-01-31 ENCOUNTER — Encounter: Payer: Medicaid Other | Admitting: Orthopedic Surgery

## 2014-02-02 ENCOUNTER — Ambulatory Visit (INDEPENDENT_AMBULATORY_CARE_PROVIDER_SITE_OTHER): Payer: Medicaid Other | Admitting: Orthopedic Surgery

## 2014-02-02 ENCOUNTER — Ambulatory Visit (INDEPENDENT_AMBULATORY_CARE_PROVIDER_SITE_OTHER): Payer: Medicaid Other

## 2014-02-02 VITALS — BP 122/86 | Ht 66.5 in | Wt 169.0 lb

## 2014-02-02 DIAGNOSIS — S93409A Sprain of unspecified ligament of unspecified ankle, initial encounter: Secondary | ICD-10-CM

## 2014-02-02 DIAGNOSIS — M25579 Pain in unspecified ankle and joints of unspecified foot: Secondary | ICD-10-CM

## 2014-02-02 MED ORDER — TRAMADOL-ACETAMINOPHEN 37.5-325 MG PO TABS: 1.0000 | ORAL_TABLET | ORAL | Status: DC | PRN

## 2014-02-02 NOTE — Patient Instructions (Addendum)
Use ice for swelling   Contrast bath   Meds ordered this encounter  Medications  . clonazePAM (KLONOPIN) 1 MG tablet    Sig: Take 1 mg by mouth daily.  . traMADol-acetaminophen (ULTRACET) 37.5-325 MG per tablet    Sig: Take 1-2 tablets by mouth every 4 (four) hours as needed.    Dispense:  90 tablet    Refill:  5

## 2014-02-02 NOTE — Progress Notes (Signed)
Patient ID: [name], female   DOB: 01/14/1976, 38 y.o.   MRN: [id number]  Chief Complaint  Patient presents with  . Follow-up    4 week recheck right ankle DOI 10/09/13   BP 122/86  Ht 5' 6.5" (1.689 m)  Wt 169 lb (76.658 kg)  BMI 26.87 kg/m2  Recheck right ankle after the patient was treated for fibular fracture with casting she return to normal activities and fell again. 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     General the patient is well-developed and well-nourished grooming and hygiene are normal Oriented x3 Mood and affect normal Ambulation no support need for ambulation there is no limp  Evaluation of the right ankle joint shows no tenderness tenderness is more proximal on the fibula and tib-fib joint. She is stable drawer test her ankle range of motion is normal she has good eversion strength skin is intact pulses are good sensations intact as well she does have some swelling in the foot and ankle area which is residual from her previous fracture  Impression  ankle sprain  Recommend contrast baths and Ultracet for pain medication no narcotics.  Meds ordered this encounter  Medications  . clonazePAM (KLONOPIN) 1 MG tablet    Sig: Take 1 mg by mouth daily.  . traMADol-acetaminophen (ULTRACET) 37.5-325 MG per tablet    Sig: Take 1-2 tablets by mouth every 4 (four) hours as needed.    Dispense:  90 tablet    Refill:  5

## 2014-05-10 ENCOUNTER — Other Ambulatory Visit: Payer: Self-pay | Admitting: *Deleted

## 2014-05-10 MED ORDER — TRAMADOL-ACETAMINOPHEN 37.5-325 MG PO TABS: 1.0000 | ORAL_TABLET | ORAL | Status: DC | PRN

## 2014-06-09 ENCOUNTER — Emergency Department (HOSPITAL_COMMUNITY)
Admission: EM | Admit: 2014-06-09 | Discharge: 2014-06-09 | Disposition: A | Discharge status: Home / Self Care | Payer: Medicaid Other | Attending: Emergency Medicine | Admitting: Emergency Medicine

## 2014-06-09 ENCOUNTER — Encounter (HOSPITAL_COMMUNITY): Payer: Self-pay | Admitting: Emergency Medicine

## 2014-06-09 DIAGNOSIS — Z79899 Other long term (current) drug therapy: Secondary | ICD-10-CM | POA: Insufficient documentation

## 2014-06-09 DIAGNOSIS — Z880 Allergy status to penicillin: Secondary | ICD-10-CM | POA: Diagnosis not present

## 2014-06-09 DIAGNOSIS — T8132XA Disruption of internal operation (surgical) wound, not elsewhere classified, initial encounter: Secondary | ICD-10-CM | POA: Insufficient documentation

## 2014-06-09 DIAGNOSIS — G40909 Epilepsy, unspecified, not intractable, without status epilepticus: Secondary | ICD-10-CM | POA: Insufficient documentation

## 2014-06-09 DIAGNOSIS — T888XXA Other specified complications of surgical and medical care, not elsewhere classified, initial encounter: Secondary | ICD-10-CM | POA: Insufficient documentation

## 2014-06-09 DIAGNOSIS — Z872 Personal history of diseases of the skin and subcutaneous tissue: Secondary | ICD-10-CM | POA: Insufficient documentation

## 2014-06-09 DIAGNOSIS — Z8679 Personal history of other diseases of the circulatory system: Secondary | ICD-10-CM | POA: Insufficient documentation

## 2014-06-09 DIAGNOSIS — N390 Urinary tract infection, site not specified: Secondary | ICD-10-CM | POA: Diagnosis not present

## 2014-06-09 DIAGNOSIS — Z79891 Long term (current) use of opiate analgesic: Secondary | ICD-10-CM | POA: Diagnosis not present

## 2014-06-09 DIAGNOSIS — F419 Anxiety disorder, unspecified: Secondary | ICD-10-CM | POA: Diagnosis not present

## 2014-06-09 DIAGNOSIS — Z8742 Personal history of other diseases of the female genital tract: Secondary | ICD-10-CM | POA: Diagnosis not present

## 2014-06-09 DIAGNOSIS — Z8781 Personal history of (healed) traumatic fracture: Secondary | ICD-10-CM | POA: Insufficient documentation

## 2014-06-09 DIAGNOSIS — Z720 Tobacco use: Secondary | ICD-10-CM | POA: Diagnosis not present

## 2014-06-09 DIAGNOSIS — T8131XA Disruption of external operation (surgical) wound, not elsewhere classified, initial encounter: Secondary | ICD-10-CM

## 2014-06-09 LAB — URINE MICROSCOPIC-ADD ON

## 2014-06-09 LAB — URINALYSIS, ROUTINE W REFLEX MICROSCOPIC
Bilirubin Urine: NEGATIVE
GLUCOSE, UA: NEGATIVE mg/dL
Ketones, ur: NEGATIVE mg/dL
Nitrite: NEGATIVE
Protein, ur: NEGATIVE mg/dL
SPECIFIC GRAVITY, URINE: 1.015 (ref 1.005–1.030)
Urobilinogen, UA: 0.2 mg/dL (ref 0.0–1.0)
pH: 6 (ref 5.0–8.0)

## 2014-06-09 MED ORDER — CEPHALEXIN 500 MG PO CAPS: 500.0000 mg | ORAL_CAPSULE | Freq: Four times a day (QID) | ORAL | Status: DC

## 2014-06-09 MED ORDER — OXYCODONE-ACETAMINOPHEN 5-325 MG PO TABS
ORAL_TABLET | ORAL | Status: AC
  Filled 2014-06-09: qty 1

## 2014-06-09 MED ORDER — HYDROCODONE-ACETAMINOPHEN 5-325 MG PO TABS: 1.0000 | ORAL_TABLET | ORAL | Status: DC | PRN

## 2014-06-09 MED ORDER — OXYCODONE-ACETAMINOPHEN 5-325 MG PO TABS
1.0000 | ORAL_TABLET | Freq: Once | ORAL | Status: AC
  Administered 2014-06-09: 1 via ORAL

## 2014-06-09 NOTE — ED Provider Notes (Signed)
CSN: [id number]     Arrival date & time 06/09/14  1713 History   First MD Initiated Contact with Patient 06/09/14 1754     No chief complaint on file.    (Consider location/radiation/quality/duration/timing/severity/associated sxs/prior Treatment) Patient is a 38 y.o. female presenting with wound check. The history is provided by the patient.  Wound Check The current episode started more than 1 month ago.   [name] is a 38 y.o. female who presents to the ED with opening of her incision site s/p hysterectomy over a month ago. The area started a tiny white spot and then opened but has only drained a small amount of blood. She spoke with her GYN and he told her to just watch it and it should close back. She states that the area has opened more and is painful. She is taking Tramadol without relief. Patient reports hematuria but denies dysuria or other problems.   Past Medical History  Diagnosis Date  . Migraine   . Neck fracture     C1  . DJD (degenerative joint disease), lumbar   . Lymph node abscess     s/p removal  . Ovarian cyst     Followed by GYN Dr. Ralph Dowdy  . Anxiety   . Depression   . PONV (postoperative nausea and vomiting)   . Seizures     1 seizure 3 years ago-with trauma   Past Surgical History  Procedure Laterality Date  . Back surgery    . Tonsillectomy    . Tubal ligation    . Colposcopy      CIN-1 Dr. Ralph Dowdy (GYN)/ HPV  . Cholecystectomy N/A 08/03/2013    Procedure: LAPAROSCOPIC CHOLECYSTECTOMY;  Surgeon: Dalia Heading, MD;  Location: [facility];  Service: General;  Laterality: N/A;  . Abdominal hysterectomy     Family History  Problem Relation Age of Onset  . Heart disease Father     MI  . Depression Father   . Hyperlipidemia Mother   . Heart disease Maternal Grandfather   . Hyperlipidemia Maternal Grandfather   . Cancer Maternal Grandfather     Colon cancer   History  Substance Use Topics  . Smoking status: Current Some Day Smoker -- 0.50 packs/day  for 10 years    Last Attempt to Quit: 08/02/2005  . Smokeless tobacco: Not on file  . Alcohol Use: Yes     Comment: rarely   OB History   Grav Para Term Preterm Abortions TAB SAB Ect Mult Living                 Review of Systems Negative except as stated in HPI   Allergies  Darvocet and Penicillins  Home Medications   Prior to Admission medications   Medication Sig Start Date End Date Taking? Authorizing Provider  butalbital-acetaminophen-caffeine (FIORICET) 50-325-40 MG per tablet Take 1 tablet by mouth every 6 (six) hours as needed for headache. 09/29/13 09/29/14  Salley Scarlet, MD  clonazePAM (KLONOPIN) 1 MG tablet Take 1 mg by mouth daily.    Historical Provider, MD  HYDROcodone-acetaminophen (NORCO/VICODIN) 5-325 MG per tablet Take 1 tablet by mouth every 6 (six) hours as needed for moderate pain. 01/03/14   Vickki Hearing, MD  oxyCODONE-acetaminophen (PERCOCET/ROXICET) 5-325 MG per tablet Take 1 or 2 po Q 6hrs for pain 10/09/13   Ward Givens, MD  traMADol-acetaminophen (ULTRACET) 37.5-325 MG per tablet Take 1-2 tablets by mouth every 4 (four) hours as needed. 05/10/14  Vickki HearingStanley E Harrison, MD   BP 150/101  Pulse 99  Temp(Src) 98.5 F (36.9 C) (Oral)  Ht 5\' 6"  (1.676 m)  Wt 159 lb (72.122 kg)  BMI 25.68 kg/m2  SpO2 98%  LMP 09/27/2013 Physical Exam  Nursing note and vitals reviewed. Constitutional: She is oriented to person, place, and time. She appears well-developed and well-nourished.  HENT:  Head: Normocephalic.  Eyes: EOM are normal.  Neck: Neck supple.  Cardiovascular: Normal rate.   Pulmonary/Chest: Effort normal.  Abdominal: Soft. There is no CVA tenderness.    There is a 1 cm open area to the right side of the incision of the lower abdomen s/p hysterectomy approximately 6 weeks ago. There is no drainage, no erythema, no red streaking or other signs of infection.   Musculoskeletal: Normal range of motion.  Neurological: She is alert and oriented to  person, place, and time. No cranial nerve deficit.  Skin: Skin is warm and dry.  Psychiatric: She has a normal mood and affect. Her behavior is normal.    ED Course  Procedures Wound cleaned with Sur cleanse, steri strips applied.   Results for orders placed during the hospital encounter of 06/09/14 (from the past 24 hour(s))  URINALYSIS, ROUTINE W REFLEX MICROSCOPIC     Status: Abnormal   Collection Time    06/09/14  5:31 PM      Result Value Ref Range   Color, Urine YELLOW  YELLOW   APPearance CLOUDY (*) CLEAR   Specific Gravity, Urine 1.015  1.005 - 1.030   pH 6.0  5.0 - 8.0   Glucose, UA NEGATIVE  NEGATIVE mg/dL   Hgb urine dipstick TRACE (*) NEGATIVE   Bilirubin Urine NEGATIVE  NEGATIVE   Ketones, ur NEGATIVE  NEGATIVE mg/dL   Protein, ur NEGATIVE  NEGATIVE mg/dL   Urobilinogen, UA 0.2  0.0 - 1.0 mg/dL   Nitrite NEGATIVE  NEGATIVE   Leukocytes, UA SMALL (*) NEGATIVE  URINE MICROSCOPIC-ADD ON     Status: Abnormal   Collection Time    06/09/14  5:31 PM      Result Value Ref Range   Squamous Epithelial / LPF FEW (*) RARE   WBC, UA 7-10  <3 WBC/hpf   Bacteria, UA MANY (*) RARE     MDM  38 y.o. female with 1 cm open area to incision site s/p abdominal hysterectomy approximately 6 weeks ago. Also complains of UTI symptoms. Will treat for UTI and she will follow up with her GYN for her wound. Urine sent for culture. Discussed with the patient clinical and lab findings and all questioned fully answered. She will return if any problems arise.    Medication List    STOP taking these medications       oxyCODONE-acetaminophen 5-325 MG per tablet  Commonly known as:  PERCOCET/ROXICET      TAKE these medications       cephALEXin 500 MG capsule  Commonly known as:  KEFLEX  Take 1 capsule (500 mg total) by mouth 4 (four) times daily.     HYDROcodone-acetaminophen 5-325 MG per tablet  Commonly known as:  NORCO/VICODIN  Take 1 tablet by mouth every 4 (four) hours as needed.        ASK your doctor about these medications       butalbital-acetaminophen-caffeine 50-325-40 MG per tablet  Commonly known as:  FIORICET  Take 1 tablet by mouth every 6 (six) hours as needed for headache.     KLONOPIN 1  MG tablet  Generic drug:  clonazePAM  Take 1 mg by mouth daily.     traMADol-acetaminophen 37.5-325 MG per tablet  Commonly known as:  ULTRACET  Take 1-2 tablets by mouth every 4 (four) hours as needed.           [facility] Orlene Och, NP 06/10/14 0130

## 2014-06-09 NOTE — ED Notes (Signed)
Patient with no complaints at this time. Respirations even and unlabored. Skin warm/dry. Discharge instructions reviewed with patient at this time. Patient given opportunity to voice concerns/ask questions. Patient discharged at this time and left Emergency Department with steady gait.   

## 2014-06-09 NOTE — ED Notes (Addendum)
[facility], has small area of incision line that is sl open, and pt says hurts.  hematuria

## 2014-06-11 LAB — URINE CULTURE

## 2014-06-12 NOTE — ED Provider Notes (Signed)
Medical screening examination/treatment/procedure(s) were performed by non-physician practitioner and as supervising physician I was immediately available for consultation/collaboration.   EKG Interpretation None      Devoria AlbeIva Aviyanna Colbaugh, MD, Armando GangFACEP   Ward GivensIva L Ousman Dise, MD 06/12/14 2222

## 2014-07-17 ENCOUNTER — Encounter (HOSPITAL_COMMUNITY): Payer: Self-pay | Admitting: *Deleted

## 2014-07-17 ENCOUNTER — Emergency Department (HOSPITAL_COMMUNITY)
Admission: EM | Admit: 2014-07-17 | Discharge: 2014-07-17 | Disposition: A | Discharge status: Home / Self Care | Payer: Medicaid Other | Attending: Emergency Medicine | Admitting: Emergency Medicine

## 2014-07-17 ENCOUNTER — Emergency Department (HOSPITAL_COMMUNITY): Payer: Medicaid Other

## 2014-07-17 DIAGNOSIS — Z792 Long term (current) use of antibiotics: Secondary | ICD-10-CM | POA: Insufficient documentation

## 2014-07-17 DIAGNOSIS — Z8742 Personal history of other diseases of the female genital tract: Secondary | ICD-10-CM | POA: Diagnosis not present

## 2014-07-17 DIAGNOSIS — Z8781 Personal history of (healed) traumatic fracture: Secondary | ICD-10-CM | POA: Diagnosis not present

## 2014-07-17 DIAGNOSIS — N200 Calculus of kidney: Secondary | ICD-10-CM

## 2014-07-17 DIAGNOSIS — G40909 Epilepsy, unspecified, not intractable, without status epilepticus: Secondary | ICD-10-CM | POA: Diagnosis not present

## 2014-07-17 DIAGNOSIS — G43909 Migraine, unspecified, not intractable, without status migrainosus: Secondary | ICD-10-CM | POA: Diagnosis not present

## 2014-07-17 DIAGNOSIS — Z79899 Other long term (current) drug therapy: Secondary | ICD-10-CM | POA: Insufficient documentation

## 2014-07-17 DIAGNOSIS — Z9851 Tubal ligation status: Secondary | ICD-10-CM | POA: Insufficient documentation

## 2014-07-17 DIAGNOSIS — Z720 Tobacco use: Secondary | ICD-10-CM | POA: Insufficient documentation

## 2014-07-17 DIAGNOSIS — F329 Major depressive disorder, single episode, unspecified: Secondary | ICD-10-CM | POA: Insufficient documentation

## 2014-07-17 DIAGNOSIS — Z87442 Personal history of urinary calculi: Secondary | ICD-10-CM | POA: Insufficient documentation

## 2014-07-17 DIAGNOSIS — R319 Hematuria, unspecified: Secondary | ICD-10-CM | POA: Diagnosis not present

## 2014-07-17 DIAGNOSIS — R109 Unspecified abdominal pain: Secondary | ICD-10-CM | POA: Diagnosis present

## 2014-07-17 DIAGNOSIS — Z9089 Acquired absence of other organs: Secondary | ICD-10-CM | POA: Diagnosis not present

## 2014-07-17 DIAGNOSIS — Z880 Allergy status to penicillin: Secondary | ICD-10-CM | POA: Diagnosis not present

## 2014-07-17 HISTORY — DX: Calculus of kidney: N20.0

## 2014-07-17 LAB — HEPATIC FUNCTION PANEL
ALBUMIN: 3.8 g/dL (ref 3.5–5.2)
ALT: 10 U/L (ref 0–35)
AST: 10 U/L (ref 0–37)
Alkaline Phosphatase: 68 U/L (ref 39–117)
Bilirubin, Direct: <0.2 mg/dL (ref 0.0–0.3)
TOTAL PROTEIN: 7.1 g/dL (ref 6.0–8.3)
Total Bilirubin: 0.2 mg/dL — ABNORMAL LOW (ref 0.3–1.2)

## 2014-07-17 LAB — CBC WITH DIFFERENTIAL/PLATELET
BASOS PCT: 0 % (ref 0–1)
Basophils Absolute: 0 10*3/uL (ref 0.0–0.1)
Eosinophils Absolute: 0.1 10*3/uL (ref 0.0–0.7)
Eosinophils Relative: 3 % (ref 0–5)
HEMATOCRIT: 39.7 % (ref 36.0–46.0)
HEMOGLOBIN: 13.6 g/dL (ref 12.0–15.0)
LYMPHS ABS: 0.9 10*3/uL (ref 0.7–4.0)
Lymphocytes Relative: 18 % (ref 12–46)
MCH: 31.8 pg (ref 26.0–34.0)
MCHC: 34.3 g/dL (ref 30.0–36.0)
MCV: 92.8 fL (ref 78.0–100.0)
MONO ABS: 0.2 10*3/uL (ref 0.1–1.0)
Monocytes Relative: 5 % (ref 3–12)
NEUTROS ABS: 3.8 10*3/uL (ref 1.7–7.7)
NEUTROS PCT: 74 % (ref 43–77)
Platelets: 211 10*3/uL (ref 150–400)
RBC: 4.28 MIL/uL (ref 3.87–5.11)
RDW: 14 % (ref 11.5–15.5)
WBC: 5 10*3/uL (ref 4.0–10.5)

## 2014-07-17 LAB — URINALYSIS, ROUTINE W REFLEX MICROSCOPIC
Bilirubin Urine: NEGATIVE
Glucose, UA: NEGATIVE mg/dL
Ketones, ur: NEGATIVE mg/dL
Nitrite: NEGATIVE
Protein, ur: NEGATIVE mg/dL
SPECIFIC GRAVITY, URINE: 1.02 (ref 1.005–1.030)
UROBILINOGEN UA: 0.2 mg/dL (ref 0.0–1.0)
pH: 7 (ref 5.0–8.0)

## 2014-07-17 LAB — URINE MICROSCOPIC-ADD ON

## 2014-07-17 LAB — BASIC METABOLIC PANEL
Anion gap: 10 (ref 5–15)
BUN: 3 mg/dL — ABNORMAL LOW (ref 6–23)
CHLORIDE: 104 meq/L (ref 96–112)
CO2: 25 meq/L (ref 19–32)
CREATININE: 0.59 mg/dL (ref 0.50–1.10)
Calcium: 8.9 mg/dL (ref 8.4–10.5)
GFR calc non Af Amer: >90 mL/min (ref >90)
Glucose, Bld: 102 mg/dL — ABNORMAL HIGH (ref 70–99)
Potassium: 3.6 mEq/L — ABNORMAL LOW (ref 3.7–5.3)
SODIUM: 139 meq/L (ref 137–147)

## 2014-07-17 MED ORDER — HYDROMORPHONE HCL 1 MG/ML IJ SOLN
1.0000 mg | Freq: Once | INTRAMUSCULAR | Status: AC
  Administered 2014-07-17: 1 mg via INTRAVENOUS
  Filled 2014-07-17: qty 1

## 2014-07-17 MED ORDER — ONDANSETRON 4 MG PO TBDP: ORAL_TABLET | ORAL | Status: DC

## 2014-07-17 MED ORDER — HYDROMORPHONE HCL 1 MG/ML IJ SOLN
0.5000 mg | Freq: Once | INTRAMUSCULAR | Status: AC
  Administered 2014-07-17: 0.5 mg via INTRAVENOUS
  Filled 2014-07-17: qty 1

## 2014-07-17 MED ORDER — CIPROFLOXACIN HCL 500 MG PO TABS: 500.0000 mg | ORAL_TABLET | Freq: Two times a day (BID) | ORAL | Status: DC

## 2014-07-17 MED ORDER — KETOROLAC TROMETHAMINE 30 MG/ML IJ SOLN
30.0000 mg | Freq: Once | INTRAMUSCULAR | Status: AC
  Administered 2014-07-17: 30 mg via INTRAVENOUS
  Filled 2014-07-17: qty 1

## 2014-07-17 MED ORDER — ONDANSETRON HCL 4 MG/2ML IJ SOLN
4.0000 mg | Freq: Once | INTRAMUSCULAR | Status: AC
  Administered 2014-07-17: 4 mg via INTRAVENOUS
  Filled 2014-07-17: qty 2

## 2014-07-17 MED ORDER — OXYCODONE-ACETAMINOPHEN 5-325 MG PO TABS: 1.0000 | ORAL_TABLET | Freq: Four times a day (QID) | ORAL | Status: DC | PRN

## 2014-07-17 MED ORDER — CIPROFLOXACIN HCL 250 MG PO TABS
500.0000 mg | ORAL_TABLET | Freq: Once | ORAL | Status: AC
  Administered 2014-07-17: 500 mg via ORAL
  Filled 2014-07-17: qty 2

## 2014-07-17 NOTE — Discharge Instructions (Signed)
Follow-up with [facility]. °

## 2014-07-17 NOTE — ED Notes (Signed)
Lt flank pain for 4 days.  Nausea,  fever

## 2014-07-17 NOTE — ED Provider Notes (Signed)
CSN: [id number]     Arrival date & time 07/17/14  1355 History  This chart was scribed for Benny LennertJoseph L Jihaad Bruschi, MD by Abel PrestoKara Demonbreun, ED Scribe. This patient was seen in room APA12/APA12 and the patient's care was started at 2:47 PM.    Chief Complaint  Patient presents with  . Flank Pain     Patient is a 38 y.o. female presenting with flank pain. The history is provided by the patient. No language interpreter was used.  Flank Pain This is a recurrent problem. The current episode started more than 2 days ago. The problem occurs constantly. The problem has not changed since onset.Pertinent negatives include no chest pain, no abdominal pain and no headaches. Exacerbated by: prolonged inactivity. Nothing relieves the symptoms. She has tried nothing for the symptoms.  HPI Comments: [name] is a 38 y.o. female with history of kidney stones who presents to the Emergency Department complaining of left flank pain onset 4 days ago. Pt notes a fever 4 days PTa. Pt has past surgical history of hysterectomy and cholecystectomy. Pt states she has passed 3 kidney stones with the most recent being "a while ago."     Past Medical History  Diagnosis Date  . Migraine   . Neck fracture     C1  . DJD (degenerative joint disease), lumbar   . Lymph node abscess     s/p removal  . Ovarian cyst     Followed by GYN Dr. Ralph DowdyBuist  . Anxiety   . Depression   . PONV (postoperative nausea and vomiting)   . Seizures     1 seizure 3 years ago-with trauma  . Kidney stone    Past Surgical History  Procedure Laterality Date  . Back surgery    . Tonsillectomy    . Tubal ligation    . Colposcopy      CIN-1 Dr. Ralph DowdyBuist (GYN)/ HPV  . Cholecystectomy N/A 08/03/2013    Procedure: LAPAROSCOPIC CHOLECYSTECTOMY;  Surgeon: Dalia HeadingMark A Jenkins, MD;  Location: [facility];  Service: General;  Laterality: N/A;  . Abdominal hysterectomy     Family History  Problem Relation Age of Onset  . Heart disease Father     MI  .  Depression Father   . Hyperlipidemia Mother   . Heart disease Maternal Grandfather   . Hyperlipidemia Maternal Grandfather   . Cancer Maternal Grandfather     Colon cancer   History  Substance Use Topics  . Smoking status: Current Some Day Smoker -- 0.50 packs/day for 10 years    Last Attempt to Quit: 08/02/2005  . Smokeless tobacco: Not on file  . Alcohol Use: Yes     Comment: rarely   OB History    No data available     Review of Systems  Constitutional: Positive for fever. Negative for appetite change and fatigue.  HENT: Negative for congestion, ear discharge and sinus pressure.   Eyes: Negative for discharge.  Respiratory: Negative for cough.   Cardiovascular: Negative for chest pain.  Gastrointestinal: Negative for abdominal pain and diarrhea.  Genitourinary: Positive for flank pain. Negative for frequency and hematuria.  Musculoskeletal: Negative for back pain.  Skin: Negative for rash.  Neurological: Negative for seizures and headaches.  Psychiatric/Behavioral: Negative for hallucinations.      Allergies  Darvocet and Penicillins  Home Medications   Prior to Admission medications   Medication Sig Start Date End Date Taking? Authorizing Provider  butalbital-acetaminophen-caffeine (FIORICET) 50-325-40 MG per tablet Take  1 tablet by mouth every 6 (six) hours as needed for headache. 09/29/13 09/29/14  Salley ScarletKawanta F Linthicum, MD  cephALEXin (KEFLEX) 500 MG capsule Take 1 capsule (500 mg total) by mouth 4 (four) times daily. 06/09/14   Hope Orlene OchM Neese, NP  clonazePAM (KLONOPIN) 1 MG tablet Take 1 mg by mouth daily.    Historical Provider, MD  HYDROcodone-acetaminophen (NORCO/VICODIN) 5-325 MG per tablet Take 1 tablet by mouth every 4 (four) hours as needed. 06/09/14   Hope Orlene OchM Neese, NP  traMADol-acetaminophen (ULTRACET) 37.5-325 MG per tablet Take 1-2 tablets by mouth every 4 (four) hours as needed. 05/10/14   Vickki HearingStanley E Harrison, MD   BP 166/104 mmHg  Pulse 115  Temp(Src) 99.2 F  (37.3 C) (Oral)  Resp 18  Ht 5\' 6"  (1.676 m)  Wt 161 lb (73.029 kg)  BMI 26.00 kg/m2  SpO2 99%  LMP 09/27/2013 Physical Exam  Constitutional: She is oriented to person, place, and time. She appears well-developed.  HENT:  Head: Normocephalic.  Eyes: Conjunctivae and EOM are normal. No scleral icterus.  Neck: Neck supple. No thyromegaly present.  Cardiovascular: Normal rate and regular rhythm.  Exam reveals no gallop and no friction rub.   No murmur heard. Pulmonary/Chest: No stridor. She has no wheezes. She has no rales. She exhibits no tenderness.  Abdominal: She exhibits no distension. There is tenderness (LLQ and Left flank tenderness). There is no rebound.  Musculoskeletal: Normal range of motion. She exhibits no edema.  Lymphadenopathy:    She has no cervical adenopathy.  Neurological: She is oriented to person, place, and time. She exhibits normal muscle tone. Coordination normal.  Skin: No rash noted. No erythema.  Psychiatric: She has a normal mood and affect. Her behavior is normal.  Nursing note and vitals reviewed.   ED Course  Procedures (including critical care time) DIAGNOSTIC STUDIES: Oxygen Saturation is 99% on room air, normal by my interpretation.    COORDINATION OF CARE: 2:49 PM Discussed treatment plan with patient at beside, the patient agrees with the plan and has no further questions at this time.   Labs Review Labs Reviewed  URINALYSIS, ROUTINE W REFLEX MICROSCOPIC - Abnormal; Notable for the following:    Hgb urine dipstick MODERATE (*)    Leukocytes, UA SMALL (*)    All other components within normal limits  URINE MICROSCOPIC-ADD ON - Abnormal; Notable for the following:    Squamous Epithelial / LPF MANY (*)    Bacteria, UA MANY (*)    All other components within normal limits  BASIC METABOLIC PANEL  CBC WITH DIFFERENTIAL    Imaging Review No results found.   EKG Interpretation None      MDM   Final diagnoses:  None     The  chart was scribed for me under my direct supervision.  I personally performed the history, physical, and medical decision making and all procedures in the evaluation of this patient.Benny Lennert.    Zidane Renner L Lucious Zou, MD 07/17/14 [phone]

## 2014-07-19 LAB — URINE CULTURE
Colony Count: NO GROWTH
Culture: NO GROWTH
SPECIAL REQUESTS: NORMAL

## 2014-09-27 ENCOUNTER — Encounter (HOSPITAL_COMMUNITY): Payer: Self-pay | Admitting: *Deleted

## 2014-09-27 ENCOUNTER — Emergency Department (HOSPITAL_COMMUNITY)
Admission: EM | Admit: 2014-09-27 | Discharge: 2014-09-27 | Disposition: A | Discharge status: Home / Self Care | Payer: Medicaid Other | Attending: Emergency Medicine | Admitting: Emergency Medicine

## 2014-09-27 DIAGNOSIS — R319 Hematuria, unspecified: Secondary | ICD-10-CM | POA: Insufficient documentation

## 2014-09-27 DIAGNOSIS — Z87442 Personal history of urinary calculi: Secondary | ICD-10-CM | POA: Diagnosis not present

## 2014-09-27 DIAGNOSIS — R509 Fever, unspecified: Secondary | ICD-10-CM | POA: Diagnosis present

## 2014-09-27 DIAGNOSIS — Z8659 Personal history of other mental and behavioral disorders: Secondary | ICD-10-CM | POA: Diagnosis not present

## 2014-09-27 DIAGNOSIS — Z79899 Other long term (current) drug therapy: Secondary | ICD-10-CM | POA: Diagnosis not present

## 2014-09-27 DIAGNOSIS — Z720 Tobacco use: Secondary | ICD-10-CM | POA: Insufficient documentation

## 2014-09-27 DIAGNOSIS — Z880 Allergy status to penicillin: Secondary | ICD-10-CM | POA: Diagnosis not present

## 2014-09-27 DIAGNOSIS — B349 Viral infection, unspecified: Secondary | ICD-10-CM | POA: Diagnosis not present

## 2014-09-27 DIAGNOSIS — Z8742 Personal history of other diseases of the female genital tract: Secondary | ICD-10-CM | POA: Diagnosis not present

## 2014-09-27 DIAGNOSIS — Z8679 Personal history of other diseases of the circulatory system: Secondary | ICD-10-CM | POA: Diagnosis not present

## 2014-09-27 LAB — COMPREHENSIVE METABOLIC PANEL
ALK PHOS: 117 U/L (ref 39–117)
ALT: 35 U/L (ref 0–35)
AST: 40 U/L — AB (ref 0–37)
Albumin: 3 g/dL — ABNORMAL LOW (ref 3.5–5.2)
Anion gap: 4 — ABNORMAL LOW (ref 5–15)
BILIRUBIN TOTAL: 0.4 mg/dL (ref 0.3–1.2)
BUN: 12 mg/dL (ref 6–23)
CALCIUM: 8.1 mg/dL — AB (ref 8.4–10.5)
CHLORIDE: 106 meq/L (ref 96–112)
CO2: 31 mmol/L (ref 19–32)
Creatinine, Ser: 0.58 mg/dL (ref 0.50–1.10)
GFR calc Af Amer: >90 mL/min (ref >90)
GLUCOSE: 97 mg/dL (ref 70–99)
POTASSIUM: 3.2 mmol/L — AB (ref 3.5–5.1)
SODIUM: 141 mmol/L (ref 135–145)
Total Protein: 5.7 g/dL — ABNORMAL LOW (ref 6.0–8.3)

## 2014-09-27 LAB — URINALYSIS, ROUTINE W REFLEX MICROSCOPIC
Bilirubin Urine: NEGATIVE
Glucose, UA: NEGATIVE mg/dL
Hgb urine dipstick: NEGATIVE
Ketones, ur: NEGATIVE mg/dL
Leukocytes, UA: NEGATIVE
NITRITE: NEGATIVE
PROTEIN: NEGATIVE mg/dL
SPECIFIC GRAVITY, URINE: 1.015 (ref 1.005–1.030)
Urobilinogen, UA: 1 mg/dL (ref 0.0–1.0)
pH: 7.5 (ref 5.0–8.0)

## 2014-09-27 LAB — CBC WITH DIFFERENTIAL/PLATELET
BASOS PCT: 4 % — AB (ref 0–1)
Basophils Absolute: 0.1 10*3/uL (ref 0.0–0.1)
EOS ABS: 0.2 10*3/uL (ref 0.0–0.7)
Eosinophils Relative: 5 % (ref 0–5)
HEMATOCRIT: 36.5 % (ref 36.0–46.0)
Hemoglobin: 12.1 g/dL (ref 12.0–15.0)
Lymphocytes Relative: 52 % — ABNORMAL HIGH (ref 12–46)
Lymphs Abs: 1.7 10*3/uL (ref 0.7–4.0)
MCH: 30.3 pg (ref 26.0–34.0)
MCHC: 33.2 g/dL (ref 30.0–36.0)
MCV: 91.5 fL (ref 78.0–100.0)
MONO ABS: 0.2 10*3/uL (ref 0.1–1.0)
Monocytes Relative: 5 % (ref 3–12)
Neutro Abs: 1.1 10*3/uL — ABNORMAL LOW (ref 1.7–7.7)
Neutrophils Relative %: 35 % — ABNORMAL LOW (ref 43–77)
Platelets: 128 10*3/uL — ABNORMAL LOW (ref 150–400)
RBC: 3.99 MIL/uL (ref 3.87–5.11)
RDW: 13.7 % (ref 11.5–15.5)
WBC: 3.3 10*3/uL — ABNORMAL LOW (ref 4.0–10.5)

## 2014-09-27 LAB — MONONUCLEOSIS SCREEN: Mono Screen: NEGATIVE

## 2014-09-27 NOTE — ED Notes (Signed)
Pt given ginger ale per EDP request. Pt tolerating well. nad noted.

## 2014-09-27 NOTE — ED Notes (Signed)
Pt with fever off and on since Friday, c/o fatigue, sore throat since last week

## 2014-09-27 NOTE — ED Notes (Signed)
Pt denies N/V/D

## 2014-09-27 NOTE — ED Provider Notes (Signed)
CSN: [id number]     Arrival date & time 09/27/14  1415 History  This chart was scribed for Donnetta HutchingBrian Jamison Yuhasz, MD by Milly JakobJohn Lee Graves, ED Scribe. The patient was seen in room APA01/APA01. Patient's care was started at 2:58 PM.     Chief Complaint  Patient presents with  . Fever   The history is provided by the patient. No language interpreter was used.   HPI Comments: [name] is a 39 y.o. female who presents to the Emergency Department complaining of a high fever intermittently for the past 6 days. She additionally reports hematuria including dark red blood, and expresses concerns of a kidney infection. She reports a history of kidney stones. She reports associated sore throat, nausea, and fatigue. She states that she took Tylenol today without relief. She denies being around any sick individuals.   Past Medical History  Diagnosis Date  . Migraine   . Neck fracture     C1  . DJD (degenerative joint disease), lumbar   . Lymph node abscess     s/p removal  . Ovarian cyst     Followed by GYN Dr. Ralph DowdyBuist  . Anxiety   . Depression   . PONV (postoperative nausea and vomiting)   . Seizures     1 seizure 3 years ago-with trauma  . Kidney stone    Past Surgical History  Procedure Laterality Date  . Back surgery    . Tonsillectomy    . Tubal ligation    . Colposcopy      CIN-1 Dr. Ralph DowdyBuist (GYN)/ HPV  . Cholecystectomy N/A 08/03/2013    Procedure: LAPAROSCOPIC CHOLECYSTECTOMY;  Surgeon: Dalia HeadingMark A Jenkins, MD;  Location: [facility];  Service: General;  Laterality: N/A;  . Abdominal hysterectomy     Family History  Problem Relation Age of Onset  . Heart disease Father     MI  . Depression Father   . Hyperlipidemia Mother   . Heart disease Maternal Grandfather   . Hyperlipidemia Maternal Grandfather   . Cancer Maternal Grandfather     Colon cancer   History  Substance Use Topics  . Smoking status: Current Some Day Smoker -- 0.50 packs/day for 10 years    Types: Cigarettes    Last Attempt to  Quit: 08/02/2005  . Smokeless tobacco: Not on file  . Alcohol Use: Yes     Comment: rarely   OB History    No data available     Review of Systems  Constitutional: Positive for fever and chills.  Genitourinary: Positive for hematuria and flank pain.   A complete 10 system review of systems was obtained and all systems are negative except as noted in the HPI and PMH.   Allergies  Darvocet and Penicillins  Home Medications   Prior to Admission medications   Medication Sig Start Date End Date Taking? Authorizing Provider  acetaminophen (TYLENOL) 500 MG tablet Take 500 mg by mouth every 6 (six) hours as needed for mild pain, moderate pain or fever.   Yes Historical Provider, MD  oxycodone (OXY-IR) 5 MG capsule Take 5 mg by mouth 3 (three) times daily.   Yes Historical Provider, MD  butalbital-acetaminophen-caffeine (FIORICET) 50-325-40 MG per tablet Take 1 tablet by mouth every 6 (six) hours as needed for headache. Patient not taking: Reported on 07/17/2014 09/29/13 09/29/14  Salley ScarletKawanta F [place], MD  cephALEXin (KEFLEX) 500 MG capsule Take 1 capsule (500 mg total) by mouth 4 (four) times daily. Patient not taking: Reported  on 07/17/2014 06/09/14   Hope Orlene Och, NP  ciprofloxacin (CIPRO) 500 MG tablet Take 1 tablet (500 mg total) by mouth 2 (two) times daily. One po bid x 7 days Patient not taking: Reported on 09/27/2014 07/17/14   Benny Lennert, MD  HYDROcodone-acetaminophen (NORCO/VICODIN) 5-325 MG per tablet Take 1 tablet by mouth every 4 (four) hours as needed. Patient not taking: Reported on 07/17/2014 06/09/14   Janne Napoleon, NP  ondansetron (ZOFRAN ODT) 4 MG disintegrating tablet  ODT q4 hours prn nausea/vomit Patient not taking: Reported on 09/27/2014 07/17/14   Benny Lennert, MD  oxyCODONE-acetaminophen (PERCOCET/ROXICET) 5-325 MG per tablet Take 1 tablet by mouth every 6 (six) hours as needed. Patient not taking: Reported on 09/27/2014 07/17/14   Benny Lennert, MD   traMADol-acetaminophen (ULTRACET) 37.5-325 MG per tablet Take 1-2 tablets by mouth every 4 (four) hours as needed. Patient not taking: Reported on 07/17/2014 05/10/14   Vickki Hearing, MD   Triage Vitals: BP 115/76 mmHg  Pulse 84  Temp(Src) 98.3 F (36.8 C) (Oral)  Resp 16  Ht  (1.676 m)  Wt 163 lb (73.936 kg)  BMI 26.32 kg/m2  SpO2 100%  LMP 09/27/2013 Physical Exam  Constitutional: She is oriented to person, place, and time. She appears well-developed and well-nourished.  HENT:  Head: Normocephalic and atraumatic.  Throat erythematous. No tonsils  Eyes: Conjunctivae and EOM are normal. Pupils are equal, round, and reactive to light.  Neck: Normal range of motion. Neck supple.  Cardiovascular: Normal rate and regular rhythm.   Pulmonary/Chest: Effort normal and breath sounds normal.  Abdominal: Soft. Bowel sounds are normal. There is CVA tenderness (right > left).  Musculoskeletal: Normal range of motion.  Neurological: She is alert and oriented to person, place, and time.  Skin: Skin is warm and dry.  Psychiatric: She has a normal mood and affect. Her behavior is normal.  Nursing note and vitals reviewed.   ED Course  Procedures (including critical care time) DIAGNOSTIC STUDIES: Oxygen Saturation is 100% on room air, normal by my interpretation.    COORDINATION OF CARE: 3:04 PM-Discussed treatment plan which includes lab work and Ibuprofen with pt at bedside and pt agreed to plan.   Results for orders placed or performed during the hospital encounter of 09/27/14  Urinalysis, Routine w reflex microscopic  Result Value Ref Range   Color, Urine YELLOW YELLOW   APPearance CLEAR CLEAR   Specific Gravity, Urine 1.015 1.005 - 1.030   pH 7.5 5.0 - 8.0   Glucose, UA NEGATIVE NEGATIVE mg/dL   Hgb urine dipstick NEGATIVE NEGATIVE   Bilirubin Urine NEGATIVE NEGATIVE   Ketones, ur NEGATIVE NEGATIVE mg/dL   Protein, ur NEGATIVE NEGATIVE mg/dL   Urobilinogen, UA 1.0 0.0 -  1.0 mg/dL   Nitrite NEGATIVE NEGATIVE   Leukocytes, UA NEGATIVE NEGATIVE  Comprehensive metabolic panel  Result Value Ref Range   Sodium 141 135 - 145 mmol/L   Potassium 3.2 (L) 3.5 - 5.1 mmol/L   Chloride 106 96 - 112 mEq/L   CO2 31 19 - 32 mmol/L   Glucose, Bld 97 70 - 99 mg/dL   BUN 12 6 - 23 mg/dL   Creatinine, Ser 1.61 0.50 - 1.10 mg/dL   Calcium 8.1 (L) 8.4 - 10.5 mg/dL   Total Protein 5.7 (L) 6.0 - 8.3 g/dL   Albumin 3.0 (L) 3.5 - 5.2 g/dL   AST 40 (H) 0 - 37 U/L   ALT 35 0 -  35 U/L   Alkaline Phosphatase 117 39 - 117 U/L   Total Bilirubin 0.4 0.3 - 1.2 mg/dL   GFR calc non Af Amer >90 >90 mL/min   GFR calc Af Amer >90 >90 mL/min   Anion gap 4 (L) 5 - 15  CBC with Differential  Result Value Ref Range   WBC 3.3 (L) 4.0 - 10.5 K/uL   RBC 3.99 3.87 - 5.11 MIL/uL   Hemoglobin 12.1 12.0 - 15.0 g/dL   HCT 84.6 96.2 - 95.2 %   MCV 91.5 78.0 - 100.0 fL   MCH 30.3 26.0 - 34.0 pg   MCHC 33.2 30.0 - 36.0 g/dL   RDW 84.1 32.4 - 40.1 %   Platelets 128 (L) 150 - 400 K/uL   Neutrophils Relative % 35 (L) 43 - 77 %   Neutro Abs 1.1 (L) 1.7 - 7.7 K/uL   Lymphocytes Relative 52 (H) 12 - 46 %   Lymphs Abs 1.7 0.7 - 4.0 K/uL   Monocytes Relative 5 3 - 12 %   Monocytes Absolute 0.2 0.1 - 1.0 K/uL   Eosinophils Relative 5 0 - 5 %   Eosinophils Absolute 0.2 0.0 - 0.7 K/uL   Basophils Relative 4 (H) 0 - 1 %   Basophils Absolute 0.1 0.0 - 0.1 K/uL   WBC Morphology ATYPICAL LYMPHOCYTES   Mononucleosis screen  Result Value Ref Range   Mono Screen NEGATIVE NEGATIVE   No results found.    EKG Interpretation None      MDM   Final diagnoses:  Viral syndrome   Urinalysis completely normal. Patient is nontoxic-appearing. White count 3.3K  with lymphocyte predominant differential. Suspect viral syndrome. No evidence of meningitis.  I personally performed the services described in this documentation, which was scribed in my presence. The recorded information has been reviewed and is  accurate.    Donnetta Hutching, MD 09/27/14 1739

## 2014-09-27 NOTE — Discharge Instructions (Signed)
You most likely have a virus. Increase fluids. Gargle with salt water. Tylenol and/or ibuprofen for pain or fever

## 2014-09-29 ENCOUNTER — Ambulatory Visit: Payer: Medicaid Other | Admitting: Urology

## 2014-11-23 ENCOUNTER — Emergency Department (HOSPITAL_COMMUNITY): Payer: Medicaid Other

## 2014-11-23 ENCOUNTER — Encounter (HOSPITAL_COMMUNITY): Payer: Self-pay | Admitting: *Deleted

## 2014-11-23 ENCOUNTER — Emergency Department (HOSPITAL_COMMUNITY)
Admission: EM | Admit: 2014-11-23 | Discharge: 2014-11-23 | Disposition: A | Discharge status: Home / Self Care | Payer: Medicaid Other | Attending: Emergency Medicine | Admitting: Emergency Medicine

## 2014-11-23 DIAGNOSIS — J40 Bronchitis, not specified as acute or chronic: Secondary | ICD-10-CM | POA: Diagnosis not present

## 2014-11-23 DIAGNOSIS — F419 Anxiety disorder, unspecified: Secondary | ICD-10-CM | POA: Insufficient documentation

## 2014-11-23 DIAGNOSIS — Z720 Tobacco use: Secondary | ICD-10-CM | POA: Insufficient documentation

## 2014-11-23 DIAGNOSIS — Z8742 Personal history of other diseases of the female genital tract: Secondary | ICD-10-CM | POA: Insufficient documentation

## 2014-11-23 DIAGNOSIS — Z79899 Other long term (current) drug therapy: Secondary | ICD-10-CM | POA: Diagnosis not present

## 2014-11-23 DIAGNOSIS — Z87442 Personal history of urinary calculi: Secondary | ICD-10-CM | POA: Insufficient documentation

## 2014-11-23 DIAGNOSIS — Z880 Allergy status to penicillin: Secondary | ICD-10-CM | POA: Diagnosis not present

## 2014-11-23 DIAGNOSIS — F329 Major depressive disorder, single episode, unspecified: Secondary | ICD-10-CM | POA: Insufficient documentation

## 2014-11-23 DIAGNOSIS — Z8679 Personal history of other diseases of the circulatory system: Secondary | ICD-10-CM | POA: Diagnosis not present

## 2014-11-23 DIAGNOSIS — Z8739 Personal history of other diseases of the musculoskeletal system and connective tissue: Secondary | ICD-10-CM | POA: Insufficient documentation

## 2014-11-23 DIAGNOSIS — R05 Cough: Secondary | ICD-10-CM | POA: Diagnosis present

## 2014-11-23 MED ORDER — PREDNISONE 50 MG PO TABS
60.0000 mg | ORAL_TABLET | Freq: Once | ORAL | Status: AC
  Administered 2014-11-23: 60 mg via ORAL
  Filled 2014-11-23 (×2): qty 1

## 2014-11-23 MED ORDER — HYDROCODONE-HOMATROPINE 5-1.5 MG/5ML PO SYRP: 5.0000 mL | ORAL_SOLUTION | Freq: Four times a day (QID) | ORAL | Status: DC | PRN

## 2014-11-23 MED ORDER — PREDNISONE 50 MG PO TABS: ORAL_TABLET | ORAL | Status: DC

## 2014-11-23 NOTE — ED Provider Notes (Signed)
CSN: [id number]     Arrival date & time 11/23/14  1311 History   First MD Initiated Contact with Patient 11/23/14 1607     Chief Complaint  Patient presents with  . Cough     (Consider location/radiation/quality/duration/timing/severity/associated sxs/prior Treatment) HPI... Cough for one month with productive sputum. Patient was seen by her primary care doctor this week and placed on Zithromax and a home nebulizer machine. She was reseen today for similar symptoms. She was sent to the emergency department for a chest x-ray. Past history of cigarette smoking. No fever, chills, rusty sputum. Severity is mild.  Past Medical History  Diagnosis Date  . Migraine   . Neck fracture     C1  . DJD (degenerative joint disease), lumbar   . Lymph node abscess     s/p removal  . Ovarian cyst     Followed by GYN Dr. Ralph Dowdy  . Anxiety   . Depression   . PONV (postoperative nausea and vomiting)   . Kidney stone   . Seizures    Past Surgical History  Procedure Laterality Date  . Back surgery    . Tonsillectomy    . Tubal ligation    . Colposcopy      CIN-1 Dr. Ralph Dowdy (GYN)/ HPV  . Cholecystectomy N/A 08/03/2013    Procedure: LAPAROSCOPIC CHOLECYSTECTOMY;  Surgeon: Dalia Heading, MD;  Location: [facility];  Service: General;  Laterality: N/A;  . Abdominal hysterectomy     Family History  Problem Relation Age of Onset  . Heart disease Father     MI  . Depression Father   . Hyperlipidemia Mother   . Heart disease Maternal Grandfather   . Hyperlipidemia Maternal Grandfather   . Cancer Maternal Grandfather     Colon cancer   History  Substance Use Topics  . Smoking status: Current Some Day Smoker -- 0.50 packs/day for 10 years    Types: Cigarettes  . Smokeless tobacco: Not on file  . Alcohol Use: Yes     Comment: rarely   OB History    No data available     Review of Systems  All other systems reviewed and are negative.     Allergies  Darvocet and Penicillins  Home  Medications   Prior to Admission medications   Medication Sig Start Date End Date Taking? Authorizing Provider  HYDROcodone-acetaminophen (NORCO/VICODIN) 5-325 MG per tablet Take 1 tablet by mouth every 4 (four) hours as needed. 06/09/14  Yes Hope Orlene Och, NP  Oxycodone HCl 10 MG TABS Take 10 mg by mouth 3 (three) times daily.   Yes Historical Provider, MD  acetaminophen (TYLENOL) 500 MG tablet Take 500 mg by mouth every 6 (six) hours as needed for mild pain, moderate pain or fever.    Historical Provider, MD  cephALEXin (KEFLEX) 500 MG capsule Take 1 capsule (500 mg total) by mouth 4 (four) times daily. Patient not taking: Reported on 07/17/2014 06/09/14   Janne Napoleon, NP  ciprofloxacin (CIPRO) 500 MG tablet Take 1 tablet (500 mg total) by mouth 2 (two) times daily. One po bid x 7 days Patient not taking: Reported on 09/27/2014 07/17/14   Bethann Berkshire, MD  HYDROcodone-homatropine [facility] 5-1.5 MG/5ML syrup Take 5 mLs by mouth every 6 (six) hours as needed for cough. 11/23/14   Donnetta Hutching, MD  ondansetron (ZOFRAN ODT) 4 MG disintegrating tablet  ODT q4 hours prn nausea/vomit Patient not taking: Reported on 09/27/2014 07/17/14   Bethann Berkshire, MD  oxyCODONE-acetaminophen (PERCOCET/ROXICET) 5-325 MG per tablet Take 1 tablet by mouth every 6 (six) hours as needed. Patient not taking: Reported on 09/27/2014 07/17/14   Bethann BerkshireJoseph Zammit, MD  predniSONE (DELTASONE) 50 MG tablet One tab daily for 5 days, one half tab for 5 days 11/23/14   Donnetta HutchingBrian Hatim Homann, MD  traMADol-acetaminophen (ULTRACET) 37.5-325 MG per tablet Take 1-2 tablets by mouth every 4 (four) hours as needed. Patient not taking: Reported on 07/17/2014 05/10/14   Vickki HearingStanley E Harrison, MD   BP 123/99 mmHg  Pulse 94  Temp(Src) 98.3 F (36.8 C) (Oral)  Resp 16  Ht 6' (1.829 m)  Wt 168 lb (76.204 kg)  BMI 22.78 kg/m2  SpO2 98%  LMP 09/27/2013 Physical Exam  Constitutional: She is oriented to person, place, and time. She appears well-developed and  well-nourished.  HENT:  Head: Normocephalic and atraumatic.  Eyes: Conjunctivae and EOM are normal. Pupils are equal, round, and reactive to light.  Neck: Normal range of motion. Neck supple.  Cardiovascular: Normal rate and regular rhythm.   Pulmonary/Chest: Effort normal.  Minimal expiratory wheeze bilaterally  Abdominal: Soft. Bowel sounds are normal.  Musculoskeletal: Normal range of motion.  Neurological: She is alert and oriented to person, place, and time.  Skin: Skin is warm and dry.  Psychiatric: She has a normal mood and affect. Her behavior is normal.  Nursing note and vitals reviewed.   ED Course  Procedures (including critical care time) Labs Review Labs Reviewed - No data to display  Imaging Review Dg Chest 2 View  11/23/2014   CLINICAL DATA:  Chest pain and productive cough for 1 month.  EXAM: CHEST  2 VIEW  COMPARISON:  October 30, 2010.  FINDINGS: The heart size and mediastinal contours are within normal limits. Both lungs are clear. No pneumothorax or pleural effusion is noted. The visualized skeletal structures are unremarkable.  IMPRESSION: No active cardiopulmonary disease.   Electronically Signed   By: Lupita RaiderJames  Green Jr, M.D.   On: 11/23/2014 14:25     EKG Interpretation None      MDM   Final diagnoses:  Bronchitis    Chest x-ray shows no pneumonia. Continue home medication. Add prednisone and Hycodan cough syrup. Stop smoking.    Donnetta HutchingBrian Elohim Brune, MD 11/23/14 [phone]

## 2014-11-23 NOTE — Discharge Instructions (Signed)
New prescriptions for prednisone and cough syrup. Increase fluids. Return if worse

## 2014-11-23 NOTE — ED Notes (Signed)
Treated for cough by PCP Monday. States she was also dx with an ear infection. Pt states epigastric pain began yesterday and began again while waiting to see the doctor for a follow up. States PCP wanted her to have a CXR and was also told that she may have pleurisy. States She was sent to the ED so results would get back faster. Pt is in no distress. States ECG was done at the office and was fine.

## 2015-04-15 ENCOUNTER — Emergency Department (HOSPITAL_COMMUNITY): Payer: Medicaid Other

## 2015-04-15 ENCOUNTER — Emergency Department (HOSPITAL_COMMUNITY)
Admission: EM | Admit: 2015-04-15 | Discharge: 2015-04-15 | Disposition: A | Discharge status: Home / Self Care | Payer: Medicaid Other | Attending: Emergency Medicine | Admitting: Emergency Medicine

## 2015-04-15 ENCOUNTER — Encounter (HOSPITAL_COMMUNITY): Payer: Self-pay | Admitting: Emergency Medicine

## 2015-04-15 DIAGNOSIS — G8929 Other chronic pain: Secondary | ICD-10-CM | POA: Diagnosis not present

## 2015-04-15 DIAGNOSIS — Z87442 Personal history of urinary calculi: Secondary | ICD-10-CM | POA: Insufficient documentation

## 2015-04-15 DIAGNOSIS — Z9851 Tubal ligation status: Secondary | ICD-10-CM | POA: Diagnosis not present

## 2015-04-15 DIAGNOSIS — R109 Unspecified abdominal pain: Secondary | ICD-10-CM

## 2015-04-15 DIAGNOSIS — R319 Hematuria, unspecified: Secondary | ICD-10-CM | POA: Diagnosis not present

## 2015-04-15 DIAGNOSIS — J189 Pneumonia, unspecified organism: Secondary | ICD-10-CM

## 2015-04-15 DIAGNOSIS — Z872 Personal history of diseases of the skin and subcutaneous tissue: Secondary | ICD-10-CM | POA: Diagnosis not present

## 2015-04-15 DIAGNOSIS — J159 Unspecified bacterial pneumonia: Secondary | ICD-10-CM | POA: Diagnosis not present

## 2015-04-15 DIAGNOSIS — Z8659 Personal history of other mental and behavioral disorders: Secondary | ICD-10-CM | POA: Diagnosis not present

## 2015-04-15 DIAGNOSIS — Z880 Allergy status to penicillin: Secondary | ICD-10-CM | POA: Diagnosis not present

## 2015-04-15 DIAGNOSIS — Z8781 Personal history of (healed) traumatic fracture: Secondary | ICD-10-CM | POA: Insufficient documentation

## 2015-04-15 DIAGNOSIS — M545 Low back pain, unspecified: Secondary | ICD-10-CM

## 2015-04-15 DIAGNOSIS — Z9049 Acquired absence of other specified parts of digestive tract: Secondary | ICD-10-CM | POA: Insufficient documentation

## 2015-04-15 DIAGNOSIS — Z90710 Acquired absence of both cervix and uterus: Secondary | ICD-10-CM | POA: Diagnosis not present

## 2015-04-15 DIAGNOSIS — Z720 Tobacco use: Secondary | ICD-10-CM | POA: Insufficient documentation

## 2015-04-15 DIAGNOSIS — Z8742 Personal history of other diseases of the female genital tract: Secondary | ICD-10-CM | POA: Diagnosis not present

## 2015-04-15 HISTORY — DX: Dorsalgia, unspecified: M54.9

## 2015-04-15 HISTORY — DX: Pain, unspecified: R52

## 2015-04-15 HISTORY — DX: Other chronic pain: G89.29

## 2015-04-15 LAB — URINALYSIS, ROUTINE W REFLEX MICROSCOPIC
Bilirubin Urine: NEGATIVE
Glucose, UA: NEGATIVE mg/dL
Ketones, ur: NEGATIVE mg/dL
Leukocytes, UA: NEGATIVE
NITRITE: NEGATIVE
PROTEIN: NEGATIVE mg/dL
UROBILINOGEN UA: 0.2 mg/dL (ref 0.0–1.0)
pH: 6 (ref 5.0–8.0)

## 2015-04-15 LAB — URINE MICROSCOPIC-ADD ON

## 2015-04-15 MED ORDER — KETOROLAC TROMETHAMINE 60 MG/2ML IM SOLN
60.0000 mg | Freq: Once | INTRAMUSCULAR | Status: AC
  Administered 2015-04-15: 60 mg via INTRAMUSCULAR
  Filled 2015-04-15: qty 2

## 2015-04-15 MED ORDER — METHOCARBAMOL 500 MG PO TABS: 1000.0000 mg | ORAL_TABLET | Freq: Four times a day (QID) | ORAL | Status: DC | PRN

## 2015-04-15 MED ORDER — FENTANYL CITRATE (PF) 100 MCG/2ML IJ SOLN
100.0000 ug | Freq: Once | INTRAMUSCULAR | Status: AC
  Administered 2015-04-15: 100 ug via INTRAMUSCULAR
  Filled 2015-04-15: qty 2

## 2015-04-15 MED ORDER — ONDANSETRON 8 MG PO TBDP
8.0000 mg | ORAL_TABLET | Freq: Once | ORAL | Status: AC
  Administered 2015-04-15: 8 mg via ORAL
  Filled 2015-04-15: qty 1

## 2015-04-15 MED ORDER — DOXYCYCLINE HYCLATE 100 MG PO TABS: 100.0000 mg | ORAL_TABLET | Freq: Two times a day (BID) | ORAL | Status: DC

## 2015-04-15 NOTE — Discharge Instructions (Signed)
°Emergency Department Resource Guide °1) Find a Doctor and Pay Out of Pocket °Although you won't have to find out who is covered by your insurance plan, it is a good idea to ask around and get recommendations. You will then need to call the office and see if the doctor you have chosen will accept you as a new patient and what types of options they offer for patients who are self-pay. Some doctors offer discounts or will set up payment plans for their patients who do not have insurance, but you will need to ask so you aren't surprised when you get to your appointment. ° °2) Contact Your Local Health Department °Not all health departments have doctors that can see patients for sick visits, but many do, so it is worth a call to see if yours does. If you don't know where your local health department is, you can check in your phone book. The CDC also has a tool to help you locate your state's health department, and many state websites also have listings of all of their local health departments. ° °3) Find a Walk-in Clinic °If your illness is not likely to be very severe or complicated, you may want to try a walk in clinic. These are popping up all over the country in pharmacies, drugstores, and shopping centers. They're usually staffed by nurse practitioners or physician assistants that have been trained to treat common illnesses and complaints. They're usually fairly quick and inexpensive. However, if you have serious medical issues or chronic medical problems, these are probably not your best option. ° °No Primary Care Doctor: °- Call [facility] at  832-8000 - they can help you locate a primary care doctor that  accepts your insurance, provides certain services, etc. °- Physician Referral Service- 1-800-533-3463 ° °Chronic Pain Problems: °Organization         Address  Phone   Notes  °[facility]  (336) 297-2271 Patients need to be referred by their primary care doctor.  ° °Medication  Assistance: °Organization         Address  Phone   Notes  °[place] [facility] [address] °[place], [place] [address] (336) 641-8030 --Must be a resident of [place] °-- Must have NO insurance coverage whatsoever (no Medicaid/ Medicare, etc.) °-- The pt. MUST have a primary care doctor that directs their care regularly and follows them in the community °  °[facility]  (866) 331-1348   °[employer]  (888) 892-1162   ° °Agencies that provide inexpensive medical care: °Organization         Address  Phone   Notes  °[facility]  (336) 832-8035   °[facility]    (336) 832-7272   °[facility] [address] °[place], [place] [address] (336) 832-4777   °[facility] [address], °[place] (336) 271-4999   °[facility]    (336) 373-0678   °[facility]    (336) 272-1050   °[facility] ° [address], [place] Phone:  (336) 832-4444, Fax:  (336) 832-4440 Hours of Operation:  9 am - 6 pm, M-F.  Also accepts Medicaid/Medicare and self-pay.  °[facility] ° [address], [place] Phone: (336) 832-3150, Fax: (336) 832-3151. Hours of Operation:  8:30 am - 5:30 pm, M-F.  Also accepts Medicaid and self-pay.  °[facility] [place] [address], [place] Phone: (336) 878-6027   °[facility] [address], [place], [place] (336)723-1848, Ext. 123 Mondays & Thursdays: 7-9 AM.  First 15 patients are seen on a first come, first serve basis. °  ° °Medicaid-accepting [place] Providers: ° °Organization         Address  Phone   Notes  °[facility] [address], [place] (336) 641-2100 Also accepts self-pay patients.  °[facility] [address], [place] ° (336) 856-9996   °[facility] [address], [place]  (336) 288-8857   °[facility] [address] [place] [address], [place] (336) 299-7000   °Veita Bland [address], [place]  ° (336) 373-1557 Only accepts [place] Access Medicaid patients after they have their name applied to their card.  ° °Self-Pay (no insurance) in [place]: ° °Organization         Address  Phone   Notes  °Sickle Cell Patients, [facility] [address], [place] (336) 832-1970   °[facility] [address], [place] (336) 832-4400   °[facility] ° [address], [place] (336) 992-4800   °[facility]/Dr. Osei-Bonsu ° [address] [place] [address], [place] or [address], [place] (336) 841-8500 Phone number for both [place] and [place] locations is the same.  °[facility] [address], [place] (336) 299-0000   °[facility] [address] [place] [address], [place] or [address] (336) 852-7530 °(336) 878-2260   °[facility] [address], [place] (336) 350-1642, phone; (336) 294-5005, fax Sees patients 1st and 3rd Saturday of every month.  Must not qualify for public or private insurance (i.e. Medicaid, Medicare, [place] Health Choice, Veterans' Benefits) • Household income should be no more than 200% of the poverty level •The clinic cannot treat you if you are pregnant or think you are pregnant • Sexually transmitted diseases are not treated at the clinic.  ° ° °Dental Care: °Organization         Address  Phone  Notes  °[place] [facility] [address], [place] (336) 641-6152 Accepts children up to age 21 who are enrolled in Medicaid or [place] Health Choice; pregnant women with a Medicaid card; and children who have applied for Medicaid or [place] Health Choice, but were declined, whose parents can pay a reduced fee at time of service.  °[place]  [facility] [place]  [address], [place] (336) 641-7733 Accepts children up to age 21 who are enrolled in Medicaid or [place] Health Choice; pregnant women with a Medicaid card; and children who have applied for Medicaid or [place] Health Choice, but were declined, whose parents can pay a reduced fee at time of service.  °[facility] ° [address], [place] (336) 641-4533 Patients are seen by appointment only. Walk-ins are not accepted. [facility] will see patients 18 years of age and older. °Monday - Tuesday (8am-5pm) °Most Wednesdays (8:30-5pm) °$30 per visit, cash only  °[facility] ° [address], [place] (336) 641-4533 Patients are seen by appointment only. Walk-ins are not accepted. [facility] will see patients 18 years of age and older. °One   Wednesday Evening (Monthly: Volunteer Based).  $30 per visit, cash only  °[facility]  (919) 537-3737 for adults; Children under age 4, call Graduate Pediatric Dentistry at (919) [phone]. Children aged 4-14, please call (919) [phone] to request a pediatric application. ° Dental services are provided in all areas of dental care including fillings, crowns and bridges, complete and partial dentures, implants, gum treatment, root canals, and extractions. Preventive care is also provided. Treatment is provided to both adults and children. °Patients are selected via a lottery and there is often a waiting list. °  °[facility] [address], °[place] ° (336) 763-8833 [email or web address] °  °[facility] [address], [place], [place] (336)723-1848, Ext. 123 Second and Fourth Thursday of each month, opens at 6:30 AM; Clinic ends at 9 AM.  Patients are seen on a first-come first-served basis, and a limited number are seen during each clinic.  ° °[facility] ° [address], [place], [place] (336) 723-7904    Eligibility Requirements °You must have lived in [place], [place], or [place] counties for at least the last three months. °  You cannot be eligible for state or federal sponsored healthcare insurance, including Veterans Administration, Medicaid, or Medicare. °  You generally cannot be eligible for healthcare insurance through your employer.  °  How to apply: °Eligibility screenings are held every Tuesday and Wednesday afternoon from 1:00 pm until 4:00 pm. You do not need an appointment for the interview!  °[facility] [address], [place], [place] 336-631-2330   °[facility]  336-342-8273   °[facility]  336-703-3100   °[facility]  336-570-6415   ° °Behavioral Health Resources in the Community: °Intensive Outpatient Programs °Organization         Address  Phone  Notes  °[facility] [address], [place], [place] 336-878-6098   °[facility] [address], [place], [place] 336-832-9800   °[facility] [address], [place], [place] ° 336-882-2125   °[facility] [address],  °[place], [place] 1-800-853-5163 or 336-641-4981   °Substance Abuse Resources °Organization         Address  Phone  Notes  °[facility]  336-882-2125   °[facility]  336-784-9470   °[facility]  336-285-9073   °[facility]  336-845-3988   °Residential & Outpatient Substance Abuse Program  1-800-659-3381   °Psychological Services °Organization         Address  Phone  Notes  °[facility]  336- 832-9600   °[facility]  336- 378-7881   °[facility] [address], [place] 1-800-853-5163 or 336-641-4981   ° °Mobile Crisis Teams °Organization         Address  Phone  Notes  °[facility], Mobile Crisis Care Unit  1-877-626-1772   °Assertive °[facility] ° [address]  [place], [place] 336-834-9664   °Sharon DeEsch [address] °[place] 336-554-5454   ° °Self-Help/Support Groups °Organization         Address  Phone             Notes  °[facility] [place] - variety of support groups  336- 373-1402 Call for more information  °Narcotics Anonymous (NA), [facility] [address], °[place]  2 meetings at this location  ° °  Residential Treatment Programs °Organization         Address  Phone  Notes  °[facility] [address],    °[place]  1-866-801-8205   °[facility] ° [address], [place], [place] 704-293-8524   °[facility] [address], [place] 336-845-3988 Admissions: 8am-3pm M-F  °[facility] [address]    °[place], [place] 336-841-1104   °[facility] [address], [place], [place] 336-379-7146   °[facility] [address]  °[place], [place] 336-285-9073   °[facility] - Intensive Outpatient [address], [place], [place] 336-852-3033   °[facility] [address]  °[place], [place] 1-877-615-2722 or 336-784-9470   °[facility] [address] [place], [place] 336-227-7417 Accepts Medicaid  °[facility] [address]  °[place] 1-800-659-3381 Substance Abuse/Addiction Treatment  ° °[place] Behavioral Health Resources °Organization         Address  Phone  Notes  °[facility]  (888) 581-9988   °Julie Brannon, PhD [address] [place], [place]   (336) 349-5553 or (336) 951-0000   °[facility]   [address] °[place], [place] (336) 349-4454   °[facility] [address], [place], [place] (336) 342-8316 Insurance/Medicaid/sponsorship through [facility]  °[facility] [address]                                    [place], [place] (336) 342-8316 Therapy/tele-psych/case    °[facility] [address]  ° [place], [place] (336) 349-2233    °Dr. Arfeen  (336) 349-4544   °[facility] [place]  [employer] [place] [facility] 1) [address],  °2) [address], [place] °3)  [address], [place] (336) 349-3220 °(336) 342-7768 ° °(336) 342-8140   °[place] [facility] (336) 342-1394 or (336) 342-3537 (After Hours)    ° ° °Take the prescriptions as directed.  Apply moist heat or ice to the area(s) of discomfort, for 15 minutes at a time, several times per day for the next few days.  Do not fall asleep on a heating or ice pack.  Call your regular medical doctor on Monday to schedule a follow up appointment this week.  Return to the Emergency Department immediately if worsening. ° °

## 2015-04-15 NOTE — ED Notes (Signed)
Patient brought in via EMS from home. Alert and oriented. Airway patent. Patient c/o left flank pain that started on Friday. Per patient blood in urine. Hx of kidney stones in which patient states pain is similar. Patient also reports fever but denies taking any medication for pain or fever. Per patient she is also nauseated but no vomiting.

## 2015-04-15 NOTE — ED Provider Notes (Signed)
CSN: [id number]     Arrival date & time 04/15/15  1701 History   First MD Initiated Contact with Patient 04/15/15 1916     Chief Complaint  Patient presents with  . Flank Pain      HPI Pt was seen at 1920. Per pt, c/o sudden onset and persistence of waxing and waning left sided flank "pain" that began 3 days ago.  Pt describes the pain as "sharp," "stabbing," "like my last kidney stone," and radiating into the left side of her abd.  Has been associated with "chills," nausea and hematuria.  Denies dysuria, no abd pain, no vomiting/diarrhea, no CP/SOB.    Past Medical History  Diagnosis Date  . Migraine   . Neck fracture     C1  . DJD (degenerative joint disease), lumbar   . Lymph node abscess     s/p removal  . Ovarian cyst     Followed by GYN Dr. Ralph Dowdy  . Anxiety   . Depression   . PONV (postoperative nausea and vomiting)   . Kidney stone   . Seizures   . Chronic pain   . Chronic back pain   . Pain management    Past Surgical History  Procedure Laterality Date  . Back surgery    . Tonsillectomy    . Tubal ligation    . Colposcopy      CIN-1 Dr. Ralph Dowdy (GYN)/ HPV  . Cholecystectomy N/A 08/03/2013    Procedure: LAPAROSCOPIC CHOLECYSTECTOMY;  Surgeon: Dalia Heading, MD;  Location: [facility];  Service: General;  Laterality: N/A;  . Abdominal hysterectomy     Family History  Problem Relation Age of Onset  . Heart disease Father     MI  . Depression Father   . Hyperlipidemia Mother   . Heart disease Maternal Grandfather   . Hyperlipidemia Maternal Grandfather   . Cancer Maternal Grandfather     Colon cancer   History  Substance Use Topics  . Smoking status: Current Some Day Smoker -- 0.50 packs/day for 10 years    Types: Cigarettes  . Smokeless tobacco: Never Used  . Alcohol Use: Yes     Comment: rarely   OB History    Gravida Para Term Preterm AB TAB SAB Ectopic Multiple Living   Review of Systems ROS: Statement: All systems negative  except as marked or noted in the HPI; Constitutional: Negative for fever and +chills. ; ; Eyes: Negative for eye pain, redness and discharge. ; ; ENMT: Negative for ear pain, hoarseness, nasal congestion, sinus pressure and sore throat. ; ; Cardiovascular: Negative for chest pain, palpitations, diaphoresis, dyspnea and peripheral edema. ; ; Respiratory: Negative for cough, wheezing and stridor. ; ; Gastrointestinal: +nausea. Negative for vomiting, diarrhea, abdominal pain, blood in stool, hematemesis, jaundice and rectal bleeding. . ; ; Genitourinary: +hematuria. Negative for dysuria. ; ; Musculoskeletal: +LBP. Negative for neck pain. Negative for swelling and trauma.; ; Skin: Negative for pruritus, rash, abrasions, blisters, bruising and skin lesion.; ; Neuro: Negative for headache, lightheadedness and neck stiffness. Negative for weakness, altered level of consciousness , altered mental status, extremity weakness, paresthesias, involuntary movement, seizure and syncope.      Allergies  Darvocet and Penicillins  Home Medications   Prior to Admission medications   Medication Sig Start Date End Date Taking? Authorizing Provider  acetaminophen (TYLENOL) 500 MG tablet Take 500 mg by mouth every 6 (six)  hours as needed for mild pain, moderate pain or fever.    Historical Provider, MD  cephALEXin (KEFLEX) 500 MG capsule Take 1 capsule (500 mg total) by mouth 4 (four) times daily. Patient not taking: Reported on 07/17/2014 06/09/14   Janne Napoleon, NP  ciprofloxacin (CIPRO) 500 MG tablet Take 1 tablet (500 mg total) by mouth 2 (two) times daily. One po bid x 7 days Patient not taking: Reported on 09/27/2014 07/17/14   Bethann Berkshire, MD  HYDROcodone-acetaminophen (NORCO/VICODIN) 5-325 MG per tablet Take 1 tablet by mouth every 4 (four) hours as needed. 06/09/14   Hope Orlene Och, NP  HYDROcodone-homatropine (HYCODAN) 5-1.5 MG/5ML syrup Take 5 mLs by mouth every 6 (six) hours as needed for cough. 11/23/14   Donnetta Hutching, MD  ondansetron (ZOFRAN ODT) 4 MG disintegrating tablet 4mg  ODT q4 hours prn nausea/vomit Patient not taking: Reported on 09/27/2014 07/17/14   Bethann Berkshire, MD  Oxycodone HCl 10 MG TABS Take 10 mg by mouth 3 (three) times daily.    Historical Provider, MD  oxyCODONE-acetaminophen (PERCOCET/ROXICET) 5-325 MG per tablet Take 1 tablet by mouth every 6 (six) hours as needed. Patient not taking: Reported on 09/27/2014 07/17/14   Bethann Berkshire, MD  predniSONE (DELTASONE) 50 MG tablet One tab daily for 5 days, one half tab for 5 days 11/23/14   Donnetta Hutching, MD  traMADol-acetaminophen (ULTRACET) 37.5-325 MG per tablet Take 1-2 tablets by mouth every 4 (four) hours as needed. Patient not taking: Reported on 07/17/2014 05/10/14   Vickki Hearing, MD   BP 111/90 mmHg  Pulse 103  Temp(Src) 99.5 F (37.5 C) (Oral)  Resp 20  Ht 5' 6.5" (1.689 m)  Wt 162 lb (73.483 kg)  BMI 25.76 kg/m2  SpO2 97%  LMP 09/27/2013 Physical Exam  1925: Physical examination:  Nursing notes reviewed; Vital signs and O2 SAT reviewed;  Constitutional: Well developed, Well nourished, Well hydrated, In no acute distress; Head:  Normocephalic, atraumatic; Eyes: EOMI, PERRL, No scleral icterus; ENMT: Mouth and pharynx normal, Mucous membranes moist; Neck: Supple, Full range of motion, No lymphadenopathy; Cardiovascular: Regular rate and rhythm, No murmur, rub, or gallop; Respiratory: Breath sounds clear & equal bilaterally, No rales, rhonchi, wheezes.  Speaking full sentences with ease, Normal respiratory effort/excursion; Chest: Nontender, Movement normal; Abdomen: Soft, Nontender, Nondistended, Normal bowel sounds; Genitourinary: No CVA tenderness; Spine:  No midline CS, TS, LS tenderness. +TTP left lumbar paraspinal muscles.;; Extremities: Pulses normal, No tenderness, No edema, No calf edema or asymmetry.; Neuro: AA&Ox3, Major CN grossly intact.  Speech clear. No gross focal motor or sensory deficits in extremities. Climbs on and  off stretcher easily by herself. Gait steady.; Skin: Color normal, Warm, Dry.   ED Course  Procedures     EKG Interpretation None      MDM  MDM Reviewed: previous chart, nursing note and vitals Reviewed previous: labs Interpretation: labs and CT scan      Results for orders placed or performed during the hospital encounter of 04/15/15  Urinalysis, Routine w reflex microscopic (not at [facility])  Result Value Ref Range   Color, Urine YELLOW YELLOW   APPearance HAZY (A) CLEAR   Specific Gravity, Urine >1.030 (H) 1.005 - 1.030   pH 6.0 5.0 - 8.0   Glucose, UA NEGATIVE NEGATIVE mg/dL   Hgb urine dipstick MODERATE (A) NEGATIVE   Bilirubin Urine NEGATIVE NEGATIVE   Ketones, ur NEGATIVE NEGATIVE mg/dL   Protein, ur NEGATIVE NEGATIVE mg/dL   Urobilinogen, UA 0.2  0.0 - 1.0 mg/dL   Nitrite NEGATIVE NEGATIVE   Leukocytes, UA NEGATIVE NEGATIVE  Urine microscopic-add on  Result Value Ref Range   Squamous Epithelial / LPF MANY (A) RARE   WBC, UA 0-2 <3 WBC/hpf   RBC / HPF 7-10 <3 RBC/hpf   Bacteria, UA MANY (A) RARE   Ct Renal Stone Study 04/15/2015   CLINICAL DATA:  Three-day history of left flank pain and hematuria  EXAM: CT ABDOMEN AND PELVIS WITHOUT CONTRAST  TECHNIQUE: Multidetector CT imaging of the abdomen and pelvis was performed following the standard protocol without oral or intravenous contrast material administration.  COMPARISON:  July 17, 2014  FINDINGS: There is patchy airspace consolidation in the right middle lobe medially. Lung bases are otherwise clear.  No focal liver lesions are identified on this noncontrast enhanced study. Gallbladder is surgically absent. There is no biliary duct dilatation.  Spleen, pancreas, and adrenals appear normal.  There is a 2 mm nonobstructing calculus in the upper pole the right kidney. There is a 2 mm calculus with an adjacent 1 mm calculus in the upper to midportion of the right kidney. On the left, there is a 1 mm calculus with a nearby  2 mm calculus in the mid to lower portion. In the mid left kidney anteriorly, there is a 3 x 1 mm calculus. In the lower pole left kidney, there is a tiny calculus anteriorly. There is no renal mass or hydronephrosis on either side. No ureteral calculi are identified on either side.  In the pelvis, the urinary bladder wall is upper normal in thickness. The subtle interstitial infiltration adjacent to the urinary bladder is present. These are findings concerning for a degree of cystitis, not progressed from prior study. There is no pelvic mass or pelvic fluid collection. Uterus is absent.  The appendix appears normal.  There is a minimal ventral hernia containing only fat.  There is no bowel obstruction. No free air or portal venous air. There is no demonstrable ascites, adenopathy, or abscess in the abdomen or pelvis. There is no abdominal aortic aneurysm. There are no blastic or lytic bone lesions.  IMPRESSION: Airspace consolidation consistent with pneumonia, medial segment right middle lobe.  Small nonobstructing calculi in each kidney. No hydronephrosis on either side. No ureteral calculus on either side.  Suspect a degree of cystitis as was questioned on prior CT. There has been no progression of the changes in the region of the urinary bladder compared to the prior study. Correlation with urinalysis would be advisable in this circumstance.  Uterus absent. Gallbladder absent. No bowel obstruction. No abscess. Appendix appears normal. Minimal ventral hernia containing only fat.   Electronically Signed   By: Bretta Bang III M.D.   On: 04/15/2015 20:01    2030:  Udip contaminated. Incidental finding of pneumonia on CXR; will tx with abx. Pt now states her LBP began while vacuuming; tx for msk LBP. Dx and testing d/w pt.  Questions answered.  Verb understanding, agreeable to d/c home with outpt f/u.   Samuel Jester, DO 04/18/15 1719

## 2015-04-16 ENCOUNTER — Emergency Department (HOSPITAL_COMMUNITY): Payer: Medicaid Other

## 2015-04-16 ENCOUNTER — Encounter (HOSPITAL_COMMUNITY): Payer: Self-pay | Admitting: Emergency Medicine

## 2015-04-16 ENCOUNTER — Emergency Department (HOSPITAL_COMMUNITY)
Admission: EM | Admit: 2015-04-16 | Discharge: 2015-04-16 | Disposition: A | Discharge status: Home / Self Care | Payer: Medicaid Other | Attending: Emergency Medicine | Admitting: Emergency Medicine

## 2015-04-16 DIAGNOSIS — Z8742 Personal history of other diseases of the female genital tract: Secondary | ICD-10-CM | POA: Insufficient documentation

## 2015-04-16 DIAGNOSIS — Z8659 Personal history of other mental and behavioral disorders: Secondary | ICD-10-CM | POA: Insufficient documentation

## 2015-04-16 DIAGNOSIS — R109 Unspecified abdominal pain: Secondary | ICD-10-CM | POA: Diagnosis present

## 2015-04-16 DIAGNOSIS — Z720 Tobacco use: Secondary | ICD-10-CM | POA: Diagnosis not present

## 2015-04-16 DIAGNOSIS — J181 Lobar pneumonia, unspecified organism: Secondary | ICD-10-CM | POA: Insufficient documentation

## 2015-04-16 DIAGNOSIS — Z8781 Personal history of (healed) traumatic fracture: Secondary | ICD-10-CM | POA: Diagnosis not present

## 2015-04-16 DIAGNOSIS — Z872 Personal history of diseases of the skin and subcutaneous tissue: Secondary | ICD-10-CM | POA: Diagnosis not present

## 2015-04-16 DIAGNOSIS — G8929 Other chronic pain: Secondary | ICD-10-CM | POA: Insufficient documentation

## 2015-04-16 DIAGNOSIS — M791 Myalgia, unspecified site: Secondary | ICD-10-CM

## 2015-04-16 DIAGNOSIS — Z79899 Other long term (current) drug therapy: Secondary | ICD-10-CM | POA: Insufficient documentation

## 2015-04-16 DIAGNOSIS — Z8679 Personal history of other diseases of the circulatory system: Secondary | ICD-10-CM | POA: Diagnosis not present

## 2015-04-16 DIAGNOSIS — Z880 Allergy status to penicillin: Secondary | ICD-10-CM | POA: Insufficient documentation

## 2015-04-16 DIAGNOSIS — Z87442 Personal history of urinary calculi: Secondary | ICD-10-CM | POA: Diagnosis not present

## 2015-04-16 DIAGNOSIS — R509 Fever, unspecified: Secondary | ICD-10-CM

## 2015-04-16 DIAGNOSIS — J189 Pneumonia, unspecified organism: Secondary | ICD-10-CM

## 2015-04-16 LAB — URINALYSIS, ROUTINE W REFLEX MICROSCOPIC
BILIRUBIN URINE: NEGATIVE
GLUCOSE, UA: NEGATIVE mg/dL
HGB URINE DIPSTICK: NEGATIVE
Ketones, ur: NEGATIVE mg/dL
LEUKOCYTES UA: NEGATIVE
Nitrite: NEGATIVE
Protein, ur: NEGATIVE mg/dL
Specific Gravity, Urine: <1.005 — ABNORMAL LOW (ref 1.005–1.030)
Urobilinogen, UA: 0.2 mg/dL (ref 0.0–1.0)
pH: 6 (ref 5.0–8.0)

## 2015-04-16 LAB — CBC WITH DIFFERENTIAL/PLATELET
BASOS ABS: 0 10*3/uL (ref 0.0–0.1)
Basophils Relative: 0 % (ref 0–1)
Eosinophils Absolute: 0.3 10*3/uL (ref 0.0–0.7)
Eosinophils Relative: 6 % — ABNORMAL HIGH (ref 0–5)
HCT: 37.5 % (ref 36.0–46.0)
Hemoglobin: 12.6 g/dL (ref 12.0–15.0)
LYMPHS PCT: 26 % (ref 12–46)
Lymphs Abs: 1.2 10*3/uL (ref 0.7–4.0)
MCH: 30.7 pg (ref 26.0–34.0)
MCHC: 33.6 g/dL (ref 30.0–36.0)
MCV: 91.2 fL (ref 78.0–100.0)
MONO ABS: 0.3 10*3/uL (ref 0.1–1.0)
MONOS PCT: 7 % (ref 3–12)
NEUTROS PCT: 61 % (ref 43–77)
Neutro Abs: 2.9 10*3/uL (ref 1.7–7.7)
PLATELETS: 155 10*3/uL (ref 150–400)
RBC: 4.11 MIL/uL (ref 3.87–5.11)
RDW: 12.7 % (ref 11.5–15.5)
WBC: 4.8 10*3/uL (ref 4.0–10.5)

## 2015-04-16 MED ORDER — OXYCODONE-ACETAMINOPHEN 5-325 MG PO TABS
2.0000 | ORAL_TABLET | Freq: Once | ORAL | Status: AC
  Administered 2015-04-16: 2 via ORAL
  Filled 2015-04-16: qty 2

## 2015-04-16 MED ORDER — METHOCARBAMOL 500 MG PO TABS
1000.0000 mg | ORAL_TABLET | Freq: Once | ORAL | Status: AC
  Administered 2015-04-16: 1000 mg via ORAL
  Filled 2015-04-16: qty 2

## 2015-04-16 MED ORDER — METHOCARBAMOL 500 MG PO TABS: 500.0000 mg | ORAL_TABLET | Freq: Three times a day (TID) | ORAL | Status: DC | PRN

## 2015-04-16 MED ORDER — HYDROCODONE-ACETAMINOPHEN 5-325 MG PO TABS: 2.0000 | ORAL_TABLET | ORAL | Status: DC | PRN

## 2015-04-16 NOTE — ED Provider Notes (Signed)
CSN: [id number]     Arrival date & time 04/16/15  1441 History   First MD Initiated Contact with Patient 04/16/15 1619     Chief Complaint  Patient presents with  . Flank Pain     HPI  Patient presents for evaluation of left flank pain. Seen and evaluated yesterday. Had a trace of blood in her urine but a negative CT of her collecting system showing no passing stone. Patient presents today essentially stating that she was confused. States that she was told she had a pneumonia. Has not had a cough but did have fever 2 days ago. She states that her aunt who has Crohn's disease is very medically knowledgeable and looked her up on the Internet convinced her to come back in because of her continued left flank pain.  Patient states his symptoms started while vacuuming. She was using her left hand patient vacuum back and forth felt sudden severe pain in her left mid back and left subcostal abdomen. Was on Friday, 3 days ago.     Past Medical History  Diagnosis Date  . Migraine   . Neck fracture     C1  . DJD (degenerative joint disease), lumbar   . Lymph node abscess     s/p removal  . Ovarian cyst     Followed by GYN Dr. Ralph Dowdy  . Anxiety   . Depression   . PONV (postoperative nausea and vomiting)   . Kidney stone   . Seizures   . Chronic pain   . Chronic back pain   . Pain management    Past Surgical History  Procedure Laterality Date  . Back surgery    . Tonsillectomy    . Tubal ligation    . Colposcopy      CIN-1 Dr. Ralph Dowdy (GYN)/ HPV  . Cholecystectomy N/A 08/03/2013    Procedure: LAPAROSCOPIC CHOLECYSTECTOMY;  Surgeon: Dalia Heading, MD;  Location: [facility];  Service: General;  Laterality: N/A;  . Abdominal hysterectomy     Family History  Problem Relation Age of Onset  . Heart disease Father     MI  . Depression Father   . Hyperlipidemia Mother   . Heart disease Maternal Grandfather   . Hyperlipidemia Maternal Grandfather   . Cancer Maternal Grandfather     Colon  cancer   History  Substance Use Topics  . Smoking status: Current Some Day Smoker -- 0.50 packs/day for 10 years    Types: Cigarettes  . Smokeless tobacco: Never Used  . Alcohol Use: Yes     Comment: rarely   OB History    Gravida Para Term Preterm AB TAB SAB Ectopic Multiple Living   2 1 1  1     1      Review of Systems  Constitutional: Positive for fever. Negative for chills, diaphoresis, appetite change and fatigue.  HENT: Negative for mouth sores, sore throat and trouble swallowing.   Eyes: Negative for visual disturbance.  Respiratory: Negative for cough, chest tightness, shortness of breath and wheezing.   Cardiovascular: Negative for chest pain.  Gastrointestinal: Negative for nausea, vomiting, abdominal pain, diarrhea and abdominal distention.  Endocrine: Negative for polydipsia, polyphagia and polyuria.  Genitourinary: Positive for hematuria and flank pain. Negative for dysuria and frequency.  Musculoskeletal: Negative for gait problem.  Skin: Negative for color change, pallor and rash.  Neurological: Negative for dizziness, syncope, light-headedness and headaches.  Hematological: Does not bruise/bleed easily.  Psychiatric/Behavioral: Negative for behavioral problems and confusion.  Allergies  Darvocet and Penicillins  Home Medications   Prior to Admission medications   Medication Sig Start Date End Date Taking? Authorizing Provider  acetaminophen (TYLENOL) 500 MG tablet Take 500 mg by mouth every 6 (six) hours as needed for mild pain, moderate pain or fever.   Yes Historical Provider, MD  Oxycodone HCl 10 MG TABS Take 10 mg by mouth 3 (three) times daily.   Yes Historical Provider, MD  tiZANidine (ZANAFLEX) 4 MG tablet Take 4 mg by mouth 2 (two) times daily.   Yes Historical Provider, MD  doxycycline (VIBRA-TABS) 100 MG tablet Take 1 tablet (100 mg total) by mouth 2 (two) times daily. 04/15/15   Samuel Jester, DO  HYDROcodone-acetaminophen (NORCO/VICODIN)  5-325 MG per tablet Take 2 tablets by mouth every 4 (four) hours as needed. 04/16/15   Rolland Porter, MD  methocarbamol (ROBAXIN) 500 MG tablet Take 1 tablet (500 mg total) by mouth 3 (three) times daily between meals as needed. 04/16/15   Rolland Porter, MD   BP 105/90 mmHg  Pulse 75  Temp(Src) 98.4 F (36.9 C) (Oral)  Resp 18  Ht 5' 6.5" (1.689 m)  Wt 162 lb (73.483 kg)  BMI 25.76 kg/m2  SpO2 99%  LMP 09/27/2013 Physical Exam  Constitutional: She is oriented to person, place, and time. She appears well-developed and well-nourished. No distress.  HENT:  Head: Normocephalic.  Eyes: Conjunctivae are normal. Pupils are equal, round, and reactive to light. No scleral icterus.  Neck: Normal range of motion. Neck supple. No thyromegaly present.  Cardiovascular: Normal rate and regular rhythm.  Exam reveals no gallop and no friction rub.   No murmur heard. Pulmonary/Chest: Effort normal and breath sounds normal. No respiratory distress. She has no wheezes. She has no rales.  Bilateral breath sounds. No wheezing rales rhonchi. No increased worker breathing.  Abdominal: Soft. Bowel sounds are normal. She exhibits no distension. There is no tenderness. There is no rebound.    Musculoskeletal: Normal range of motion.       Back:  Neurological: She is alert and oriented to person, place, and time.  Skin: Skin is warm and dry. No rash noted.  Psychiatric: She has a normal mood and affect. Her behavior is normal.    ED Course  Procedures (including critical care time) Labs Review Labs Reviewed  URINALYSIS, ROUTINE W REFLEX MICROSCOPIC (NOT AT [facility]) - Abnormal; Notable for the following:    Specific Gravity, Urine <1.005 (*)    All other components within normal limits  CBC WITH DIFFERENTIAL/PLATELET - Abnormal; Notable for the following:    Eosinophils Relative 6 (*)    All other components within normal limits    Imaging Review Dg Chest 2 View  04/16/2015   CLINICAL DATA:  Left flank pain.  The patient reports she was diagnosed with pneumonia 04/15/2015.  EXAM: CHEST  2 VIEW  COMPARISON:  CT abdomen and pelvis 04/15/2015. PA and lateral chest 11/23/2014 and 10/30/2010.  FINDINGS: Patchy airspace disease is seen in the right middle lobe. The left lung appears clear. Heart size is normal. No pneumothorax or pleural effusion.  IMPRESSION: Patchy right middle lobe airspace disease compatible with pneumonia as seen on CT abdomen and pelvis yesterday.   Electronically Signed   By: Drusilla Kanner M.D.   On: 04/16/2015 17:18   Ct Renal Stone Study  04/15/2015   CLINICAL DATA:  Three-day history of left flank pain and hematuria  EXAM: CT ABDOMEN AND PELVIS WITHOUT CONTRAST  TECHNIQUE:  Multidetector CT imaging of the abdomen and pelvis was performed following the standard protocol without oral or intravenous contrast material administration.  COMPARISON:  July 17, 2014  FINDINGS: There is patchy airspace consolidation in the right middle lobe medially. Lung bases are otherwise clear.  No focal liver lesions are identified on this noncontrast enhanced study. Gallbladder is surgically absent. There is no biliary duct dilatation.  Spleen, pancreas, and adrenals appear normal.  There is a 2 mm nonobstructing calculus in the upper pole the right kidney. There is a 2 mm calculus with an adjacent 1 mm calculus in the upper to midportion of the right kidney. On the left, there is a 1 mm calculus with a nearby 2 mm calculus in the mid to lower portion. In the mid left kidney anteriorly, there is a 3 x 1 mm calculus. In the lower pole left kidney, there is a tiny calculus anteriorly. There is no renal mass or hydronephrosis on either side. No ureteral calculi are identified on either side.  In the pelvis, the urinary bladder wall is upper normal in thickness. The subtle interstitial infiltration adjacent to the urinary bladder is present. These are findings concerning for a degree of cystitis, not progressed from  prior study. There is no pelvic mass or pelvic fluid collection. Uterus is absent.  The appendix appears normal.  There is a minimal ventral hernia containing only fat.  There is no bowel obstruction. No free air or portal venous air. There is no demonstrable ascites, adenopathy, or abscess in the abdomen or pelvis. There is no abdominal aortic aneurysm. There are no blastic or lytic bone lesions.  IMPRESSION: Airspace consolidation consistent with pneumonia, medial segment right middle lobe.  Small nonobstructing calculi in each kidney. No hydronephrosis on either side. No ureteral calculus on either side.  Suspect a degree of cystitis as was questioned on prior CT. There has been no progression of the changes in the region of the urinary bladder compared to the prior study. Correlation with urinalysis would be advisable in this circumstance.  Uterus absent. Gallbladder absent. No bowel obstruction. No abscess. Appendix appears normal. Minimal ventral hernia containing only fat.   Electronically Signed   By: Bretta Bang III M.D.   On: 04/15/2015 20:01     EKG Interpretation None      MDM   Final diagnoses:  Fever  RLL pneumonia  Muscular pain    I discussed the infiltrate noted on x-ray and CT with the patient. I think her left flank pain is clearly muscular. Offered her a limited amount of pain medicine for it and encouraged use of anti-and laboratories. However she states "they make my stomach sick". Given Robaxin. Continue her doxycycline. Primary care follow-up.    Rolland Porter, MD 04/16/15 [phone]

## 2015-04-16 NOTE — ED Notes (Signed)
Lt flank Pain that radiates around her side - Has been feeling nauseated - HX of kidney stones

## 2015-04-16 NOTE — Discharge Instructions (Signed)
Musculoskeletal Pain °Musculoskeletal pain is muscle and boney aches and pains. These pains can occur in any part of the body. Your caregiver may treat you without knowing the cause of the pain. They may treat you if blood or urine tests, X-rays, and other tests were normal.  °CAUSES °There is often not a definite cause or reason for these pains. These pains may be caused by a type of germ (virus). The discomfort may also come from overuse. Overuse includes working out too hard when your body is not fit. Boney aches also come from weather changes. Bone is sensitive to atmospheric pressure changes. °HOME CARE INSTRUCTIONS  °· Ask when your test results will be ready. Make sure you get your test results. °· Only take over-the-counter or prescription medicines for pain, discomfort, or fever as directed by your caregiver. If you were given medications for your condition, do not drive, operate machinery or power tools, or sign legal documents for 24 hours. Do not drink alcohol. Do not take sleeping pills or other medications that may interfere with treatment. °· Continue all activities unless the activities cause more pain. When the pain lessens, slowly resume normal activities. Gradually increase the intensity and duration of the activities or exercise. °· During periods of severe pain, bed rest may be helpful. Lay or sit in any position that is comfortable. °· Putting ice on the injured area. °¨ Put ice in a bag. °¨ Place a towel between your skin and the bag. °¨ Leave the ice on for 15 to 20 minutes, 3 to 4 times a day. °· Follow up with your caregiver for continued problems and no reason can be found for the pain. If the pain becomes worse or does not go away, it may be necessary to repeat tests or do additional testing. Your caregiver may need to look further for a possible cause. °SEEK IMMEDIATE MEDICAL CARE IF: °· You have pain that is getting worse and is not relieved by medications. °· You develop chest pain  that is associated with shortness or breath, sweating, feeling sick to your stomach (nauseous), or throw up (vomit). °· Your pain becomes localized to the abdomen. °· You develop any new symptoms that seem different or that concern you. °MAKE SURE YOU:  °· Understand these instructions. °· Will watch your condition. °· Will get help right away if you are not doing well or get worse. °Document Released: 08/25/2005 Document Revised: 11/17/2011 Document Reviewed: 04/29/2013 °ExitCare® Patient Information ©2015 [employer]. This information is not intended to replace advice given to you by your health care provider. Make sure you discuss any questions you have with your health care provider. ° °Pneumonia, Adult °Pneumonia is an infection of the lungs. It may be caused by a germ (virus or bacteria). Some types of pneumonia can spread easily from person to person. This can happen when you cough or sneeze. °HOME CARE °· Only take medicine as told by your doctor. °· Take your medicine (antibiotics) as told. Finish it even if you start to feel better. °· Do not smoke. °· You may use a vaporizer or humidifier in your room. This can help loosen thick spit (mucus). °· Sleep so you are almost sitting up (semi-upright). This helps reduce coughing. °· Rest. °A shot (vaccine) can help prevent pneumonia. Shots are often advised for: °· People over 65 years old. °· Patients on chemotherapy. °· People with long-term (chronic) lung problems. °· People with immune system problems. °GET HELP RIGHT AWAY IF:  °·   You are getting worse. °· You cannot control your cough, and you are losing sleep. °· You cough up blood. °· Your pain gets worse, even with medicine. °· You have a fever. °· Any of your problems are getting worse, not better. °· You have shortness of breath or chest pain. °MAKE SURE YOU:  °· Understand these instructions. °· Will watch your condition. °· Will get help right away if you are not doing well or get worse. °Document  Released: 02/11/2008 Document Revised: 11/17/2011 Document Reviewed: 11/15/2010 °ExitCare® Patient Information ©2015 [employer]. This information is not intended to replace advice given to you by your health care provider. Make sure you discuss any questions you have with your health care provider. ° °

## 2015-04-16 NOTE — ED Notes (Signed)
MD at bedside. 

## 2015-06-23 ENCOUNTER — Emergency Department (HOSPITAL_COMMUNITY): Payer: Medicaid Other

## 2015-06-23 ENCOUNTER — Encounter (HOSPITAL_COMMUNITY): Payer: Self-pay | Admitting: Emergency Medicine

## 2015-06-23 ENCOUNTER — Emergency Department (HOSPITAL_COMMUNITY)
Admission: EM | Admit: 2015-06-23 | Discharge: 2015-06-23 | Disposition: A | Discharge status: Home / Self Care | Payer: Medicaid Other | Attending: Emergency Medicine | Admitting: Emergency Medicine

## 2015-06-23 DIAGNOSIS — J45901 Unspecified asthma with (acute) exacerbation: Secondary | ICD-10-CM | POA: Diagnosis not present

## 2015-06-23 DIAGNOSIS — Z872 Personal history of diseases of the skin and subcutaneous tissue: Secondary | ICD-10-CM | POA: Diagnosis not present

## 2015-06-23 DIAGNOSIS — Z8739 Personal history of other diseases of the musculoskeletal system and connective tissue: Secondary | ICD-10-CM | POA: Diagnosis not present

## 2015-06-23 DIAGNOSIS — Z8659 Personal history of other mental and behavioral disorders: Secondary | ICD-10-CM | POA: Insufficient documentation

## 2015-06-23 DIAGNOSIS — Z720 Tobacco use: Secondary | ICD-10-CM | POA: Insufficient documentation

## 2015-06-23 DIAGNOSIS — Z87442 Personal history of urinary calculi: Secondary | ICD-10-CM | POA: Insufficient documentation

## 2015-06-23 DIAGNOSIS — G8929 Other chronic pain: Secondary | ICD-10-CM | POA: Insufficient documentation

## 2015-06-23 DIAGNOSIS — Z880 Allergy status to penicillin: Secondary | ICD-10-CM | POA: Diagnosis not present

## 2015-06-23 DIAGNOSIS — Z79899 Other long term (current) drug therapy: Secondary | ICD-10-CM | POA: Insufficient documentation

## 2015-06-23 DIAGNOSIS — Z8781 Personal history of (healed) traumatic fracture: Secondary | ICD-10-CM | POA: Diagnosis not present

## 2015-06-23 DIAGNOSIS — Z8742 Personal history of other diseases of the female genital tract: Secondary | ICD-10-CM | POA: Insufficient documentation

## 2015-06-23 DIAGNOSIS — R0600 Dyspnea, unspecified: Secondary | ICD-10-CM

## 2015-06-23 DIAGNOSIS — R079 Chest pain, unspecified: Secondary | ICD-10-CM | POA: Diagnosis present

## 2015-06-23 LAB — CBC WITH DIFFERENTIAL/PLATELET
Basophils Absolute: 0 10*3/uL (ref 0.0–0.1)
Basophils Relative: 0 %
EOS ABS: 0.2 10*3/uL (ref 0.0–0.7)
Eosinophils Relative: 6 %
HCT: 38.1 % (ref 36.0–46.0)
Hemoglobin: 13.3 g/dL (ref 12.0–15.0)
Lymphocytes Relative: 31 %
Lymphs Abs: 1.2 10*3/uL (ref 0.7–4.0)
MCH: 30.8 pg (ref 26.0–34.0)
MCHC: 34.9 g/dL (ref 30.0–36.0)
MCV: 88.2 fL (ref 78.0–100.0)
MONOS PCT: 6 %
Monocytes Absolute: 0.2 10*3/uL (ref 0.1–1.0)
Neutro Abs: 2.1 10*3/uL (ref 1.7–7.7)
Neutrophils Relative %: 57 %
PLATELETS: 168 10*3/uL (ref 150–400)
RBC: 4.32 MIL/uL (ref 3.87–5.11)
RDW: 12.7 % (ref 11.5–15.5)
WBC: 3.7 10*3/uL — ABNORMAL LOW (ref 4.0–10.5)

## 2015-06-23 LAB — COMPREHENSIVE METABOLIC PANEL
ALBUMIN: 3.5 g/dL (ref 3.5–5.0)
ALT: 25 U/L (ref 14–54)
AST: 23 U/L (ref 15–41)
Alkaline Phosphatase: 73 U/L (ref 38–126)
Anion gap: 6 (ref 5–15)
BUN: 9 mg/dL (ref 6–20)
CALCIUM: 8.6 mg/dL — AB (ref 8.9–10.3)
CO2: 23 mmol/L (ref 22–32)
CREATININE: 0.72 mg/dL (ref 0.44–1.00)
Chloride: 107 mmol/L (ref 101–111)
GFR calc non Af Amer: >60 mL/min (ref >60)
GLUCOSE: 120 mg/dL — AB (ref 65–99)
Potassium: 3.4 mmol/L — ABNORMAL LOW (ref 3.5–5.1)
Sodium: 136 mmol/L (ref 135–145)
Total Bilirubin: 0.6 mg/dL (ref 0.3–1.2)
Total Protein: 6.6 g/dL (ref 6.5–8.1)

## 2015-06-23 MED ORDER — PREDNISONE 20 MG PO TABS: 60.0000 mg | ORAL_TABLET | Freq: Every day | ORAL | Status: AC

## 2015-06-23 MED ORDER — METHYLPREDNISOLONE SODIUM SUCC 125 MG IJ SOLR
125.0000 mg | Freq: Once | INTRAMUSCULAR | Status: AC
  Administered 2015-06-23: 125 mg via INTRAVENOUS
  Filled 2015-06-23: qty 2

## 2015-06-23 MED ORDER — IPRATROPIUM-ALBUTEROL 0.5-2.5 (3) MG/3ML IN SOLN
RESPIRATORY_TRACT | Status: AC
  Filled 2015-06-23: qty 3

## 2015-06-23 MED ORDER — FENTANYL CITRATE (PF) 100 MCG/2ML IJ SOLN
25.0000 ug | Freq: Once | INTRAMUSCULAR | Status: AC
  Administered 2015-06-23: 25 ug via INTRAVENOUS
  Filled 2015-06-23: qty 2

## 2015-06-23 MED ORDER — IPRATROPIUM-ALBUTEROL 0.5-2.5 (3) MG/3ML IN SOLN
3.0000 mL | Freq: Once | RESPIRATORY_TRACT | Status: AC
Start: 2015-06-23 — End: 2015-06-23
  Administered 2015-06-23: 3 mL via RESPIRATORY_TRACT
  Filled 2015-06-23: qty 3

## 2015-06-23 MED ORDER — KETOROLAC TROMETHAMINE 30 MG/ML IJ SOLN
30.0000 mg | Freq: Once | INTRAMUSCULAR | Status: AC
  Administered 2015-06-23: 30 mg via INTRAVENOUS
  Filled 2015-06-23: qty 1

## 2015-06-23 NOTE — ED Notes (Signed)
Pt reports cp since yesterday in central chest no radiation with SOB.  Pt has used inhaler with no relief. Pt alert and oriented.

## 2015-06-23 NOTE — Discharge Instructions (Signed)
As discussed, with today's evaluation for your difficulty breathing, it is most likely that have bronchitis.  Please take all medication as directed, including albuterol, every 4 hours for the next 2 days. Please be sure to follow up with your primary care physician for ongoing care. Return here for concerning changes in your condition.

## 2015-06-23 NOTE — ED Notes (Signed)
Respiratory called for nebulizer treatment.

## 2015-06-23 NOTE — ED Notes (Signed)
Patient's O2 dropped to 88% on room air. Patient complained of having harder time breather after duoneb. Started patient on 2L O2 via [place].

## 2015-06-23 NOTE — ED Provider Notes (Signed)
CSN: [id number]     Arrival date & time 06/23/15  1309 History   First MD Initiated Contact with Patient 06/23/15 1314     Chief Complaint  Patient presents with  . Chest Pain    HPI  Patient presents with concern of cough, chest pain with coughing, dyspnea. Symptoms began 2 days ago, since onset have been persistent, with no relief in spite of using albuterol therapy at home. No fever, chills, syncope. No abdominal pain, nausea, vomiting, diarrhea. Patient has a history of chronic bronchitis, childhood asthma.   Past Medical History  Diagnosis Date  . Migraine   . Neck fracture (HCC)     C1  . DJD (degenerative joint disease), lumbar   . Lymph node abscess     s/p removal  . Ovarian cyst     Followed by GYN Dr. Ralph DowdyBuist  . Anxiety   . Depression   . PONV (postoperative nausea and vomiting)   . Kidney stone   . Seizures (HCC)   . Chronic pain   . Chronic back pain   . Pain management    Past Surgical History  Procedure Laterality Date  . Back surgery    . Tonsillectomy    . Tubal ligation    . Colposcopy      CIN-1 Dr. Ralph DowdyBuist (GYN)/ HPV  . Cholecystectomy N/A 08/03/2013    Procedure: LAPAROSCOPIC CHOLECYSTECTOMY;  Surgeon: Dalia HeadingMark A Jenkins, MD;  Location: [facility];  Service: General;  Laterality: N/A;  . Abdominal hysterectomy     Family History  Problem Relation Age of Onset  . Heart disease Father     MI  . Depression Father   . Hyperlipidemia Mother   . Heart disease Maternal Grandfather   . Hyperlipidemia Maternal Grandfather   . Cancer Maternal Grandfather     Colon cancer   Social History  Substance Use Topics  . Smoking status: Current Some Day Smoker -- 0.50 packs/day for 10 years    Types: Cigarettes  . Smokeless tobacco: Never Used  . Alcohol Use: Yes     Comment: rarely   OB History    Gravida Para Term Preterm AB TAB SAB Ectopic Multiple Living   2 1 1  1     1      Review of Systems  Constitutional:       Per HPI, otherwise negative  HENT:        Per HPI, otherwise negative  Respiratory:       Per HPI, otherwise negative  Cardiovascular:       Per HPI, otherwise negative  Gastrointestinal: Negative for vomiting.  Endocrine:       Negative aside from HPI  Genitourinary:       Neg aside from HPI   Musculoskeletal:       Per HPI, otherwise negative  Skin: Negative.   Neurological: Negative for syncope.      Allergies  Darvocet and Penicillins  Home Medications   Prior to Admission medications   Medication Sig Start Date End Date Taking? Authorizing Provider  acetaminophen (TYLENOL) 500 MG tablet Take 500 mg by mouth every 6 (six) hours as needed for mild pain, moderate pain or fever.   Yes Historical Provider, MD  albuterol (PROVENTIL HFA;VENTOLIN HFA) 108 (90 BASE) MCG/ACT inhaler Inhale 2 puffs into the lungs every 6 (six) hours as needed for wheezing or shortness of breath.   Yes Historical Provider, MD  Oxycodone HCl 10 MG TABS Take 10 mg by  mouth 3 (three) times daily.   Yes Historical Provider, MD  tiZANidine (ZANAFLEX) 4 MG tablet Take 4 mg by mouth 2 (two) times daily.   Yes Historical Provider, MD  doxycycline (VIBRA-TABS) 100 MG tablet Take 1 tablet (100 mg total) by mouth 2 (two) times daily. Patient not taking: Reported on 06/23/2015 04/15/15   Samuel Jester, DO  HYDROcodone-acetaminophen (NORCO/VICODIN) 5-325 MG per tablet Take 2 tablets by mouth every 4 (four) hours as needed. Patient not taking: Reported on 06/23/2015 04/16/15   Rolland Porter, MD  methocarbamol (ROBAXIN) 500 MG tablet Take 1 tablet (500 mg total) by mouth 3 (three) times daily between meals as needed. Patient not taking: Reported on 06/23/2015 04/16/15   Rolland Porter, MD   BP 109/64 mmHg  Pulse 80  Temp(Src) 98.9 F (37.2 C) (Oral)  Resp 14  Ht  (1.676 m)  Wt 160 lb (72.576 kg)  BMI 25.84 kg/m2  SpO2 100%  LMP 09/27/2013 Physical Exam  Constitutional: She is oriented to person, place, and time. She appears well-developed and  well-nourished. No distress.  HENT:  Head: Normocephalic and atraumatic.  Eyes: Conjunctivae and EOM are normal.  Cardiovascular: Normal rate and regular rhythm.   Pulmonary/Chest: Effort normal. No stridor. No respiratory distress. She has decreased breath sounds.  Abdominal: She exhibits no distension.  Musculoskeletal: She exhibits no edema.  Neurological: She is alert and oriented to person, place, and time. No cranial nerve deficit.  Skin: Skin is warm and dry.  Psychiatric: She has a normal mood and affect.  Nursing note and vitals reviewed.   ED Course  Procedures (including critical care time) Labs Review Labs Reviewed  COMPREHENSIVE METABOLIC PANEL - Abnormal; Notable for the following:    Potassium 3.4 (*)    Glucose, Bld 120 (*)    Calcium 8.6 (*)    All other components within normal limits  CBC WITH DIFFERENTIAL/PLATELET - Abnormal; Notable for the following:    WBC 3.7 (*)    All other components within normal limits    Imaging Review Dg Chest 2 View  06/23/2015  CLINICAL DATA:  39 year old female with recent pneumonia in August 2016 presenting with cough and low-grade fever, with wheezing and shortness of breath. EXAM: CHEST  2 VIEW COMPARISON:  Chest x-ray 04/16/2015. FINDINGS: Lung volumes are normal. No consolidative airspace disease. No pleural effusions. No pneumothorax. No pulmonary nodule or mass noted. Pulmonary vasculature and the cardiomediastinal silhouette are within normal limits. IMPRESSION: No radiographic evidence of acute cardiopulmonary disease. Electronically Signed   By: Trudie Reed M.D.   On: 06/23/2015 15:10   I have personally reviewed and evaluated these images and lab results as part of my medical decision-making.  EKG sinus rhythm, rate 69, RSR, anteriorly, otherwise unremarkable.  Pulse oximetry 95% room air normal   Following albuterol therapy, the patient had slight decrease in oxygen sats, but with nasal cannula returned to  normal.   MDM  Generally well-appearing female presents with one day of cough, chest pain associate with coughing, otherwise no chest pain. Patient has no risk factors for pulmonary embolism, no physical exam findings consistent with this. X-ray is unremarkable. Patient improved here, was discharged after initiation of steroids, scheduled bronchodilating, to follow-up with primary care.  Gerhard Munch, MD 06/23/15 [phone]

## 2016-02-06 ENCOUNTER — Emergency Department (HOSPITAL_COMMUNITY)
Admission: EM | Admit: 2016-02-06 | Discharge: 2016-02-06 | Disposition: A | Discharge status: Home / Self Care | Payer: Medicaid Other | Attending: Emergency Medicine | Admitting: Emergency Medicine

## 2016-02-06 ENCOUNTER — Encounter (HOSPITAL_COMMUNITY): Payer: Self-pay

## 2016-02-06 DIAGNOSIS — R109 Unspecified abdominal pain: Secondary | ICD-10-CM

## 2016-02-06 DIAGNOSIS — F17210 Nicotine dependence, cigarettes, uncomplicated: Secondary | ICD-10-CM | POA: Insufficient documentation

## 2016-02-06 DIAGNOSIS — R1032 Left lower quadrant pain: Secondary | ICD-10-CM | POA: Insufficient documentation

## 2016-02-06 DIAGNOSIS — F329 Major depressive disorder, single episode, unspecified: Secondary | ICD-10-CM | POA: Insufficient documentation

## 2016-02-06 DIAGNOSIS — R112 Nausea with vomiting, unspecified: Secondary | ICD-10-CM | POA: Insufficient documentation

## 2016-02-06 LAB — URINALYSIS, ROUTINE W REFLEX MICROSCOPIC
Bilirubin Urine: NEGATIVE
GLUCOSE, UA: NEGATIVE mg/dL
HGB URINE DIPSTICK: NEGATIVE
Ketones, ur: NEGATIVE mg/dL
Leukocytes, UA: NEGATIVE
Nitrite: NEGATIVE
Protein, ur: NEGATIVE mg/dL
SPECIFIC GRAVITY, URINE: 1.01 (ref 1.005–1.030)
pH: 6 (ref 5.0–8.0)

## 2016-02-06 MED ORDER — ONDANSETRON 4 MG PO TBDP: 4.0000 mg | ORAL_TABLET | Freq: Once | ORAL | Status: DC

## 2016-02-06 MED ORDER — MORPHINE SULFATE (PF) 4 MG/ML IV SOLN
6.0000 mg | Freq: Once | INTRAVENOUS | Status: AC
  Administered 2016-02-06: 6 mg via INTRAVENOUS
  Filled 2016-02-06: qty 2

## 2016-02-06 MED ORDER — ONDANSETRON 4 MG PO TBDP
4.0000 mg | ORAL_TABLET | Freq: Once | ORAL | Status: AC
  Administered 2016-02-06: 4 mg via ORAL
  Filled 2016-02-06: qty 1

## 2016-02-06 NOTE — ED Notes (Signed)
Pt reports pain in left flank since last week but moved to llq since the weekend.  Reports n/v today.  Pt reports history of kidney stones.

## 2016-02-06 NOTE — Discharge Instructions (Signed)
Reevaluation free flank pain. This is likely a kidney stone. We did ultrasound of the kidneys and did not find any abnormal enlargement of the kidneys. You have been given a prescription for Zofran to help with her nausea.. You have been given information for follow-up with urology. If you have developing fevers or worsening nausea and vomiting, please return for reevaluation.

## 2016-02-06 NOTE — ED Provider Notes (Signed)
CSN: [id number]     Arrival date & time 02/06/16  0721 History   First MD Initiated Contact with Patient 02/06/16 [phone]     Chief Complaint  Patient presents with  . Abdominal Pain     (Consider location/radiation/quality/duration/timing/severity/associated sxs/prior Treatment) Patient is a 40 y.o. female presenting with abdominal pain.  Abdominal Pain Pain location:  L flank Pain quality: aching   Pain radiates to:  LLQ Pain severity:  Severe Onset quality:  Gradual Duration:  1 week Timing:  Constant Context: previous surgery   Context: not alcohol use, not eating, not medication withdrawal and not sick contacts   Relieved by:  Nothing Worsened by:  Nothing tried Ineffective treatments: opiates. Associated symptoms: nausea and vomiting   Associated symptoms: no constipation, no diarrhea, no dysuria, no fever, no hematemesis, no hematochezia, no hematuria and no shortness of breath     Past Medical History  Diagnosis Date  . Migraine   . Neck fracture (HCC)     C1  . DJD (degenerative joint disease), lumbar   . Lymph node abscess     s/p removal  . Ovarian cyst     Followed by GYN Dr.  DowdyBuist  . Anxiety   . Depression   . PONV (postoperative nausea and vomiting)   . Kidney stone   . Seizures (HCC)   . Chronic pain   . Chronic back pain   . Pain management    Past Surgical History  Procedure Laterality Date  . Back surgery    . Tonsillectomy    . Tubal ligation    . Colposcopy      CIN-1 Dr.  DowdyBuist (GYN)/ HPV  . Cholecystectomy N/A 08/03/2013    Procedure: LAPAROSCOPIC CHOLECYSTECTOMY;  Surgeon: Dalia HeadingMark A Jenkins, MD;  Location: [facility];  Service: General;  Laterality: N/A;  . Abdominal hysterectomy     Family History  Problem Relation Age of Onset  . Heart disease Father     MI  . Depression Father   . Hyperlipidemia Mother   . Heart disease Maternal Grandfather   . Hyperlipidemia Maternal Grandfather   . Cancer Maternal Grandfather     Colon cancer    Social History  Substance Use Topics  . Smoking status: Current Some Day Smoker -- 0.50 packs/day for 10 years    Types: Cigarettes  . Smokeless tobacco: Never Used  . Alcohol Use: Yes     Comment: rarely   OB History    Gravida Para Term Preterm AB TAB SAB Ectopic Multiple Living   2 1 1  1     1      Review of Systems  Constitutional: Negative for fever.  Respiratory: Negative for shortness of breath.   Gastrointestinal: Positive for nausea, vomiting and abdominal pain. Negative for diarrhea, constipation, hematochezia and hematemesis.  Genitourinary: Positive for frequency and flank pain. Negative for dysuria and hematuria.       Urinary hesitancy   All other systems reviewed and are negative.     Allergies  Darvocet and Penicillins  Home Medications   Prior to Admission medications   Medication Sig Start Date End Date Taking? Authorizing Provider  acetaminophen (TYLENOL) 500 MG tablet Take 500 mg by mouth every 6 (six) hours as needed for mild pain, moderate pain or fever.    Historical Provider, MD  albuterol (PROVENTIL HFA;VENTOLIN HFA) 108 (90 BASE) MCG/ACT inhaler Inhale 2 puffs into the lungs every 6 (six) hours as needed for wheezing or shortness of breath.  Historical Provider, MD  doxycycline (VIBRA-TABS) 100 MG tablet Take 1 tablet (100 mg total) by mouth 2 (two) times daily. Patient not taking: Reported on 06/23/2015 04/15/15   Samuel Jester, DO  HYDROcodone-acetaminophen (NORCO/VICODIN) 5-325 MG per tablet Take 2 tablets by mouth every 4 (four) hours as needed. Patient not taking: Reported on 06/23/2015 04/16/15   Rolland Porter, MD  methocarbamol (ROBAXIN) 500 MG tablet Take 1 tablet (500 mg total) by mouth 3 (three) times daily between meals as needed. Patient not taking: Reported on 06/23/2015 04/16/15   Rolland Porter, MD  ondansetron (ZOFRAN-ODT) 4 MG disintegrating tablet Take 1 tablet (4 mg total) by mouth once. 02/06/16   Narda Bonds, MD  Oxycodone HCl 10  MG TABS Take 10 mg by mouth 3 (three) times daily.    Historical Provider, MD  tiZANidine (ZANAFLEX) 4 MG tablet Take 4 mg by mouth 2 (two) times daily.    Historical Provider, MD   BP 116/78 mmHg  Pulse 65  Temp(Src) 98.8 F (37.1 C) (Oral)  Resp 18  Ht  (1.702 m)  Wt 88.451 kg  BMI 30.53 kg/m2  SpO2 97%  LMP 09/27/2013 Physical Exam  Constitutional: She is oriented to person, place, and time. She appears well-developed and well-nourished. No distress.  Eyes: Conjunctivae and EOM are normal. Pupils are equal, round, and reactive to light.  Neck: Normal range of motion.  Cardiovascular: Normal rate, regular rhythm and normal heart sounds.   Pulmonary/Chest: Effort normal and breath sounds normal.  Abdominal: Soft. Normal appearance and bowel sounds are normal. There is tenderness in the left lower quadrant. There is no CVA tenderness.  Musculoskeletal: Normal range of motion.       Lumbar back: She exhibits tenderness.  Neurological: She is alert and oriented to person, place, and time.  Skin: She is not diaphoretic.  Vitals reviewed.   ED Course  Procedures (including critical care time) Labs Review Labs Reviewed  URINALYSIS, ROUTINE W REFLEX MICROSCOPIC (NOT AT [facility])    Imaging Review No results found. I have personally reviewed and evaluated these images and lab results as part of my medical decision-making.   EKG Interpretation None      MDM   Final diagnoses:  Flank pain    Patient's urinalysis not significant for infection. Symptoms consistent with likely passing of small kidney stone. Gallstones seen in left kidney on last CT scan, which was in August 2016. Bedside ultrasound did not reveal any evidence of renal stones or hydronephrosis in the kidneys. Symptoms rotated to flank and upper abdomen, and not consistent with ovarian torsion. Discuss with patient, patient agrees with plan we'll discharge home with Zofran and patient can continue taking home  narcotics for pain. Advised to follow-up with urology since she has recurrent stones. Red flags given for infected stones. Patient stable for discharge home.    Narda Bonds, MD 02/06/16 1052  Blane Ohara, MD 02/06/16 [phone]

## 2016-03-12 ENCOUNTER — Emergency Department (HOSPITAL_COMMUNITY): Payer: Medicaid Other

## 2016-03-12 ENCOUNTER — Emergency Department (HOSPITAL_COMMUNITY)
Admission: EM | Admit: 2016-03-12 | Discharge: 2016-03-12 | Disposition: A | Discharge status: Home / Self Care | Payer: Medicaid Other | Attending: Emergency Medicine | Admitting: Emergency Medicine

## 2016-03-12 ENCOUNTER — Encounter (HOSPITAL_COMMUNITY): Payer: Self-pay | Admitting: Emergency Medicine

## 2016-03-12 DIAGNOSIS — R1032 Left lower quadrant pain: Secondary | ICD-10-CM | POA: Insufficient documentation

## 2016-03-12 DIAGNOSIS — R39198 Other difficulties with micturition: Secondary | ICD-10-CM | POA: Diagnosis not present

## 2016-03-12 DIAGNOSIS — M7989 Other specified soft tissue disorders: Secondary | ICD-10-CM | POA: Insufficient documentation

## 2016-03-12 DIAGNOSIS — Z79899 Other long term (current) drug therapy: Secondary | ICD-10-CM | POA: Diagnosis not present

## 2016-03-12 DIAGNOSIS — Z79891 Long term (current) use of opiate analgesic: Secondary | ICD-10-CM | POA: Diagnosis not present

## 2016-03-12 DIAGNOSIS — F17210 Nicotine dependence, cigarettes, uncomplicated: Secondary | ICD-10-CM | POA: Diagnosis not present

## 2016-03-12 DIAGNOSIS — F329 Major depressive disorder, single episode, unspecified: Secondary | ICD-10-CM | POA: Insufficient documentation

## 2016-03-12 LAB — I-STAT BETA HCG BLOOD, ED (MC, WL, AP ONLY): I-stat hCG, quantitative: <5 m[IU]/mL (ref <5)

## 2016-03-12 LAB — COMPREHENSIVE METABOLIC PANEL
ALBUMIN: 3.9 g/dL (ref 3.5–5.0)
ALT: 33 U/L (ref 14–54)
ANION GAP: 5 (ref 5–15)
AST: 28 U/L (ref 15–41)
Alkaline Phosphatase: 75 U/L (ref 38–126)
BUN: 6 mg/dL (ref 6–20)
CHLORIDE: 105 mmol/L (ref 101–111)
CO2: 25 mmol/L (ref 22–32)
Calcium: 8.5 mg/dL — ABNORMAL LOW (ref 8.9–10.3)
Creatinine, Ser: 0.65 mg/dL (ref 0.44–1.00)
GFR calc Af Amer: >60 mL/min (ref >60)
GFR calc non Af Amer: >60 mL/min (ref >60)
GLUCOSE: 92 mg/dL (ref 65–99)
POTASSIUM: 3.9 mmol/L (ref 3.5–5.1)
SODIUM: 135 mmol/L (ref 135–145)
Total Bilirubin: 0.6 mg/dL (ref 0.3–1.2)
Total Protein: 7.1 g/dL (ref 6.5–8.1)

## 2016-03-12 LAB — CBC WITH DIFFERENTIAL/PLATELET
BASOS ABS: 0 10*3/uL (ref 0.0–0.1)
Basophils Relative: 1 %
EOS PCT: 4 %
Eosinophils Absolute: 0.1 10*3/uL (ref 0.0–0.7)
HEMATOCRIT: 39.5 % (ref 36.0–46.0)
Hemoglobin: 13.7 g/dL (ref 12.0–15.0)
LYMPHS PCT: 40 %
Lymphs Abs: 1.4 10*3/uL (ref 0.7–4.0)
MCH: 30.4 pg (ref 26.0–34.0)
MCHC: 34.7 g/dL (ref 30.0–36.0)
MCV: 87.6 fL (ref 78.0–100.0)
MONO ABS: 0.2 10*3/uL (ref 0.1–1.0)
Monocytes Relative: 5 %
NEUTROS ABS: 1.8 10*3/uL (ref 1.7–7.7)
Neutrophils Relative %: 50 %
PLATELETS: 160 10*3/uL (ref 150–400)
RBC: 4.51 MIL/uL (ref 3.87–5.11)
RDW: 12.9 % (ref 11.5–15.5)
WBC: 3.6 10*3/uL — ABNORMAL LOW (ref 4.0–10.5)

## 2016-03-12 LAB — URINALYSIS, ROUTINE W REFLEX MICROSCOPIC
BILIRUBIN URINE: NEGATIVE
Glucose, UA: NEGATIVE mg/dL
Hgb urine dipstick: NEGATIVE
Ketones, ur: NEGATIVE mg/dL
LEUKOCYTES UA: NEGATIVE
NITRITE: NEGATIVE
PROTEIN: NEGATIVE mg/dL
Specific Gravity, Urine: 1.02 (ref 1.005–1.030)
pH: 6 (ref 5.0–8.0)

## 2016-03-12 LAB — LIPASE, BLOOD: LIPASE: 21 U/L (ref 11–51)

## 2016-03-12 MED ORDER — PROMETHAZINE HCL 25 MG PO TABS: 25.0000 mg | ORAL_TABLET | Freq: Four times a day (QID) | ORAL | Status: DC | PRN

## 2016-03-12 MED ORDER — NAPROXEN 500 MG PO TABS: 500.0000 mg | ORAL_TABLET | Freq: Two times a day (BID) | ORAL | Status: DC

## 2016-03-12 MED ORDER — SODIUM CHLORIDE 0.9 % IV BOLUS (SEPSIS): 250.0000 mL | Freq: Once | INTRAVENOUS | Status: DC

## 2016-03-12 MED ORDER — SODIUM CHLORIDE 0.9 % IV SOLN
INTRAVENOUS | Status: DC
  Administered 2016-03-12: 19:00:00 via INTRAVENOUS

## 2016-03-12 MED ORDER — SODIUM CHLORIDE 0.9 % IV BOLUS (SEPSIS)
500.0000 mL | Freq: Once | INTRAVENOUS | Status: AC
  Administered 2016-03-12: 500 mL via INTRAVENOUS

## 2016-03-12 MED ORDER — SODIUM CHLORIDE 0.9 % IV SOLN: INTRAVENOUS | Status: DC

## 2016-03-12 NOTE — Discharge Instructions (Signed)
Workup without any significant findings. Renal function is normal. Chest x-ray showed no signs of fluid. CAT scan of the abdomen showed no significant abnormalities. Would recommend follow-up with your primary provider. Return for any new or worse symptoms.

## 2016-03-12 NOTE — ED Notes (Signed)
Patient complaining of swelling to left hand and bilateral feet since yesterday. Some swelling noted to left hand at triage. No swelling noted to feet. Also states "I vomited one time last night and I've been having pain to my back."

## 2016-03-12 NOTE — ED Provider Notes (Signed)
CSN: [id number]     Arrival date & time 03/12/16  1203 History   First MD Initiated Contact with Patient 03/12/16 1536     Chief Complaint  Patient presents with  . Multiple Complaints      (Consider location/radiation/quality/duration/timing/severity/associated sxs/prior Treatment) The history is provided by the patient.  40 year old female with several complaints. One is swelling to left hand bilateral leg swelling and left flank left lower quadrant abdominal pain. Patient also states that she's been having difficulty urinating. Patient has a history of chronic low back pain. That is baseline for her. Patient also has a past history significant for narcotic abuse.  Past Medical History  Diagnosis Date  . Migraine   . Neck fracture (HCC)     C1  . DJD (degenerative joint disease), lumbar   . Lymph node abscess     s/p removal  . Ovarian cyst     Followed by GYN Dr. Ralph Dowdy  . Anxiety   . Depression   . PONV (postoperative nausea and vomiting)   . Kidney stone   . Seizures (HCC)   . Chronic pain   . Chronic back pain   . Pain management    Past Surgical History  Procedure Laterality Date  . Back surgery    . Tonsillectomy    . Tubal ligation    . Colposcopy      CIN-1 Dr. Ralph Dowdy (GYN)/ HPV  . Cholecystectomy N/A 08/03/2013    Procedure: LAPAROSCOPIC CHOLECYSTECTOMY;  Surgeon: Dalia Heading, MD;  Location: [facility];  Service: General;  Laterality: N/A;  . Abdominal hysterectomy     Family History  Problem Relation Age of Onset  . Heart disease Father     MI  . Depression Father   . Hyperlipidemia Mother   . Heart disease Maternal Grandfather   . Hyperlipidemia Maternal Grandfather   . Cancer Maternal Grandfather     Colon cancer   Social History  Substance Use Topics  . Smoking status: Current Some Day Smoker -- 0.50 packs/day for 10 years    Types: Cigarettes  . Smokeless tobacco: Never Used  . Alcohol Use: Yes     Comment: rarely   OB History    Gravida Para  Term Preterm AB TAB SAB Ectopic Multiple Living   2 1 1  1     1      Review of Systems  Constitutional: Negative for fever.  HENT: Negative for congestion.   Eyes: Negative for visual disturbance.  Respiratory: Negative for shortness of breath.   Cardiovascular: Positive for leg swelling. Negative for chest pain.  Gastrointestinal: Positive for abdominal pain.  Genitourinary: Positive for difficulty urinating. Negative for dysuria.  Musculoskeletal: Positive for joint swelling.  Skin: Negative for rash.  Neurological: Negative for headaches.  Psychiatric/Behavioral: Negative for confusion.      Allergies  Darvocet and Penicillins  Home Medications   Prior to Admission medications   Medication Sig Start Date End Date Taking? Authorizing Provider  acetaminophen (TYLENOL) 500 MG tablet Take 500 mg by mouth every 6 (six) hours as needed for mild pain, moderate pain or fever.   Yes Historical Provider, MD  albuterol (PROVENTIL HFA;VENTOLIN HFA) 108 (90 BASE) MCG/ACT inhaler Inhale 2 puffs into the lungs every 6 (six) hours as needed for wheezing or shortness of breath.   Yes Historical Provider, MD  ondansetron (ZOFRAN-ODT) 4 MG disintegrating tablet Take 1 tablet (4 mg total) by mouth once. Patient taking differently: Take 4 mg by  mouth daily as needed for nausea or vomiting.  02/06/16  Yes Narda Bondsalph A Nettey, MD  Oxycodone HCl 10 MG TABS Take 10 mg by mouth 3 (three) times daily.   Yes Historical Provider, MD  tiZANidine (ZANAFLEX) 4 MG tablet Take 4 mg by mouth 2 (two) times daily.   Yes Historical Provider, MD   BP 136/86 mmHg  Pulse 83  Temp(Src) 98.1 F (36.7 C) (Oral)  Resp 18  Ht 5\' 6"  (1.676 m)  Wt 87.091 kg  BMI 31.00 kg/m2  SpO2 100%  LMP 09/27/2013 Physical Exam  Constitutional: She is oriented to person, place, and time. She appears well-developed and well-nourished. No distress.  HENT:  Head: Normocephalic and atraumatic.  Mouth/Throat: Oropharynx is clear and  moist.  Eyes: Conjunctivae are normal. Pupils are equal, round, and reactive to light.  Neck: Normal range of motion. Neck supple.  Cardiovascular: Normal rate and regular rhythm.   No murmur heard. Pulmonary/Chest: Effort normal and breath sounds normal. No respiratory distress.  Abdominal: Soft. Bowel sounds are normal. There is no tenderness.  Musculoskeletal: She exhibits edema.  Patient with swelling to the left hand. No obvious swelling to the legs.  Neurological: She is alert and oriented to person, place, and time. No cranial nerve deficit. She exhibits normal muscle tone. Coordination normal.  Skin: Skin is warm. No rash noted. No erythema.  Nursing note and vitals reviewed.   ED Course  Procedures (including critical care time) Labs Review Labs Reviewed  COMPREHENSIVE METABOLIC PANEL - Abnormal; Notable for the following:    Calcium 8.5 (*)    All other components within normal limits  CBC WITH DIFFERENTIAL/PLATELET - Abnormal; Notable for the following:    WBC 3.6 (*)    All other components within normal limits  URINALYSIS, ROUTINE W REFLEX MICROSCOPIC (NOT AT [facility])  LIPASE, BLOOD  I-STAT BETA HCG BLOOD, ED ([facility], [facility], [facility] ONLY)   Results for orders placed or performed during the hospital encounter of 03/12/16  Urinalysis, Routine w reflex microscopic (not at [facility])  Result Value Ref Range   Color, Urine YELLOW YELLOW   APPearance CLEAR CLEAR   Specific Gravity, Urine 1.020 1.005 - 1.030   pH 6.0 5.0 - 8.0   Glucose, UA NEGATIVE NEGATIVE mg/dL   Hgb urine dipstick NEGATIVE NEGATIVE   Bilirubin Urine NEGATIVE NEGATIVE   Ketones, ur NEGATIVE NEGATIVE mg/dL   Protein, ur NEGATIVE NEGATIVE mg/dL   Nitrite NEGATIVE NEGATIVE   Leukocytes, UA NEGATIVE NEGATIVE  Comprehensive metabolic panel  Result Value Ref Range   Sodium 135 135 - 145 mmol/L   Potassium 3.9 3.5 - 5.1 mmol/L   Chloride 105 101 - 111 mmol/L   CO2 25 22 - 32 mmol/L   Glucose, Bld 92 65 - 99 mg/dL   BUN  6 6 - 20 mg/dL   Creatinine, Ser 8.650.65 0.44 - 1.00 mg/dL   Calcium 8.5 (L) 8.9 - 10.3 mg/dL   Total Protein 7.1 6.5 - 8.1 g/dL   Albumin 3.9 3.5 - 5.0 g/dL   AST 28 15 - 41 U/L   ALT 33 14 - 54 U/L   Alkaline Phosphatase 75 38 - 126 U/L   Total Bilirubin 0.6 0.3 - 1.2 mg/dL   GFR calc non Af Amer >60 >60 mL/min   GFR calc Af Amer >60 >60 mL/min   Anion gap 5 5 - 15  Lipase, blood  Result Value Ref Range   Lipase 21 11 - 51 U/L  CBC with Differential/Platelet  Result Value Ref Range   WBC 3.6 (L) 4.0 - 10.5 K/uL   RBC 4.51 3.87 - 5.11 MIL/uL   Hemoglobin 13.7 12.0 - 15.0 g/dL   HCT 16.139.5 09.636.0 - 04.546.0 %   MCV 87.6 78.0 - 100.0 fL   MCH 30.4 26.0 - 34.0 pg   MCHC 34.7 30.0 - 36.0 g/dL   RDW 40.912.9 81.111.5 - 91.415.5 %   Platelets 160 150 - 400 K/uL   Neutrophils Relative % 50 %   Neutro Abs 1.8 1.7 - 7.7 K/uL   Lymphocytes Relative 40 %   Lymphs Abs 1.4 0.7 - 4.0 K/uL   Monocytes Relative 5 %   Monocytes Absolute 0.2 0.1 - 1.0 K/uL   Eosinophils Relative 4 %   Eosinophils Absolute 0.1 0.0 - 0.7 K/uL   Basophils Relative 1 %   Basophils Absolute 0.0 0.0 - 0.1 K/uL  I-Stat Beta hCG blood, ED ([facility], [facility], [facility] only)  Result Value Ref Range   I-stat hCG, quantitative <5.0 <5 mIU/mL   Comment 3             Imaging Review Dg Chest 2 View  03/12/2016  CLINICAL DATA:  Bilateral foot and hand swelling since this morning. Left flank pain. EXAM: CHEST  2 VIEW COMPARISON:  06/23/2015. FINDINGS: Trachea is midline. Heart size normal. Lungs are clear. No pleural fluid. IMPRESSION: Negative. Electronically Signed   By: Leanna BattlesMelinda  Blietz M.D.   On: 03/12/2016 17:37   Ct Renal Stone Study  03/12/2016  CLINICAL DATA:  Left flank pain beginning yesterday. History of urinary tract stone disease. EXAM: CT ABDOMEN AND PELVIS WITHOUT CONTRAST TECHNIQUE: Multidetector CT imaging of the abdomen and pelvis was performed following the standard protocol without IV contrast. COMPARISON:  04/15/2015 and multiple previous  CT studies. FINDINGS: Lung bases are clear. No pleural or pericardial fluid. No coronary artery calcification. Liver appears normal without contrast. Previous cholecystectomy. The spleen is normal. The pancreas is normal. The adrenal glands are normal. The aorta and IVC are normal. No retroperitoneal mass or adenopathy. No free intraperitoneal fluid or air. No bowel pathology. Within the right kidney, there are approximately 5 small nonobstructing stones, the largest measuring 3 mm. No evidence of hydronephrosis or passing stone on the right. Within the left kidney, there are 3 stones in the lower pole, the largest measuring 4 mm. No hydronephrosis or passing stone. No stone in the bladder. Previous hysterectomy. Chronic facet degeneration on the right in the lower lumbar spine. IMPRESSION: No acute finding by CT. Multiple small nonobstructing calculi in both kidneys, the largest measuring 3 mm on the right and 4 mm on the left. No evidence of hydronephrosis or passing stone. No stone in the bladder. Previous cholecystectomy.  Previous hysterectomy. Electronically Signed   By: Paulina FusiMark  Shogry M.D.   On: 03/12/2016 18:18   I have personally reviewed and evaluated these images and lab results as part of my medical decision-making.   EKG Interpretation   Date/Time:  Wednesday March 12 2016 17:09:56 EDT Ventricular Rate:  78 PR Interval:    QRS Duration: 92 QT Interval:  394 QTC Calculation: 449 R Axis:   83 Text Interpretation:  Sinus rhythm Borderline T wave abnormalities  Confirmed by Jacarie Pate  MD, Tamberlyn Midgley ([id number]) on 03/12/2016 5:19:58 PM      MDM   Final diagnoses:  Leg swelling  Left lower quadrant pain    Patient with complaint of left hand swelling bilateral leg swelling  and some left flank left lower quadrant abdominal pain. No significant findings and workup. No evidence of any renal function abnormality. Urinalysis is normal. No leukocytosis no anemia. Will have patient follow-up with  primary care doctor.    Vanetta Mulders, MD 03/12/16 1850

## 2016-10-09 ENCOUNTER — Emergency Department (HOSPITAL_COMMUNITY): Payer: Medicaid Other

## 2016-10-09 ENCOUNTER — Encounter (HOSPITAL_COMMUNITY): Payer: Self-pay | Admitting: *Deleted

## 2016-10-09 ENCOUNTER — Emergency Department (HOSPITAL_COMMUNITY)
Admission: EM | Admit: 2016-10-09 | Discharge: 2016-10-09 | Disposition: A | Discharge status: Home / Self Care | Payer: Medicaid Other | Attending: Emergency Medicine | Admitting: Emergency Medicine

## 2016-10-09 DIAGNOSIS — R3129 Other microscopic hematuria: Secondary | ICD-10-CM | POA: Diagnosis not present

## 2016-10-09 DIAGNOSIS — R109 Unspecified abdominal pain: Secondary | ICD-10-CM | POA: Diagnosis present

## 2016-10-09 DIAGNOSIS — F17210 Nicotine dependence, cigarettes, uncomplicated: Secondary | ICD-10-CM | POA: Diagnosis not present

## 2016-10-09 DIAGNOSIS — Z79899 Other long term (current) drug therapy: Secondary | ICD-10-CM | POA: Insufficient documentation

## 2016-10-09 DIAGNOSIS — R110 Nausea: Secondary | ICD-10-CM | POA: Diagnosis not present

## 2016-10-09 DIAGNOSIS — N83202 Unspecified ovarian cyst, left side: Secondary | ICD-10-CM | POA: Insufficient documentation

## 2016-10-09 DIAGNOSIS — R3915 Urgency of urination: Secondary | ICD-10-CM | POA: Insufficient documentation

## 2016-10-09 LAB — BASIC METABOLIC PANEL
Anion gap: 11 (ref 5–15)
BUN: 8 mg/dL (ref 6–20)
CHLORIDE: 102 mmol/L (ref 101–111)
CO2: 22 mmol/L (ref 22–32)
Calcium: 9.6 mg/dL (ref 8.9–10.3)
Creatinine, Ser: 0.75 mg/dL (ref 0.44–1.00)
GFR calc Af Amer: >60 mL/min (ref >60)
GFR calc non Af Amer: >60 mL/min (ref >60)
Glucose, Bld: 102 mg/dL — ABNORMAL HIGH (ref 65–99)
POTASSIUM: 3.8 mmol/L (ref 3.5–5.1)
SODIUM: 135 mmol/L (ref 135–145)

## 2016-10-09 LAB — URINALYSIS, MICROSCOPIC (REFLEX)

## 2016-10-09 LAB — URINALYSIS, ROUTINE W REFLEX MICROSCOPIC
BILIRUBIN URINE: NEGATIVE
Glucose, UA: NEGATIVE mg/dL
Ketones, ur: NEGATIVE mg/dL
Leukocytes, UA: NEGATIVE
Nitrite: NEGATIVE
pH: 5.5 (ref 5.0–8.0)

## 2016-10-09 MED ORDER — KETOROLAC TROMETHAMINE 30 MG/ML IJ SOLN
30.0000 mg | Freq: Once | INTRAMUSCULAR | Status: AC
  Administered 2016-10-09: 30 mg via INTRAVENOUS
  Filled 2016-10-09: qty 1

## 2016-10-09 MED ORDER — METOCLOPRAMIDE HCL 5 MG/ML IJ SOLN
10.0000 mg | Freq: Once | INTRAMUSCULAR | Status: AC
  Administered 2016-10-09: 10 mg via INTRAVENOUS
  Filled 2016-10-09: qty 2

## 2016-10-09 NOTE — ED Notes (Signed)
Pt with hx of kidney stones, left flank pain with nausea.

## 2016-10-09 NOTE — ED Notes (Signed)
ED Provider at bedside. 

## 2016-10-09 NOTE — ED Notes (Signed)
Bladder scan done post void and pt with no urine in bladder, edp notified

## 2016-10-09 NOTE — ED Notes (Signed)
Patient transported to CT 

## 2016-10-09 NOTE — ED Triage Notes (Signed)
Pt comes in with left flank pain starting 4 days ago. Pt states she is now having trouble urinating. Pt has blood in urine. Hx of kidney stones.

## 2016-10-09 NOTE — Discharge Instructions (Signed)
Take Advil for mild pain as directed or you can take your oxycodone for bad pain. You have trace microscopic amounts of blood in your urine, you may have passed a kidney stone. The CT scan today also shows that you have a small next to your left ovary, that does not require follow-up. Call Dr.Eskrigde's office tomorrow to schedule an appointment within the next one or 2 weeks. Or you can ask your primary care physician for referral to urologist. Return if pain not well controlled or if concern for any reason

## 2016-10-09 NOTE — ED Provider Notes (Signed)
[facility]-EMERG[facility]Y DEPT Provider Note   CSN: [id number] Arrival date & time: 10/09/16  1701     History   Chief Complaint Chief Complaint  Patient presents with  . Flank Pain    HPI [name] is a 41 y.o. female.Complains of left flank pain onset 3 days ago. Pain was constant until last night when it suddenly stopped today pain has been intermittent pain is sharp and nonradiating feels like kidney stone she's had in the past. Associated symptoms include nausea no vomiting no fever no other associated symptoms. No treatment prior to coming here.  HPI  Past Medical History:  Diagnosis Date  . Anxiety   . Chronic back pain   . Chronic pain   . Depression   . DJD (degenerative joint disease), lumbar   . Kidney stone   . Lymph node abscess    s/p removal  . Migraine   . Neck fracture (HCC)    C1  . Ovarian cyst    Followed by GYN Dr. Ralph Dowdy  . Pain management   . PONV (postoperative nausea and vomiting)   . Seizures [facility])     Patient Active Problem List   Diagnosis Date Noted  . Closed right fibular fracture 10/11/2013  . Narcotic abuse 08/31/2013  . Abdominal pain 07/30/2013  . Transaminitis 07/30/2013  . Acute bronchitis 05/19/2013  . Depression with anxiety 01/10/2013  . Diarrhea 10/27/2011  . Seizure (HCC) 09/29/2011  . Anxiety 07/01/2011  . NECK PAIN 12/14/2006  . RADICULOPATHY 12/14/2006  . DEGENERATIVE DISC DISEASE, LUMBAR SPINE 11/25/2006  . OBESITY NOS 10/26/2006  . COMMON MIGRAINE 10/26/2006  . LOW BACK PAIN, CHRONIC 10/26/2006    Past Surgical History:  Procedure Laterality Date  . ABDOMINAL HYSTERECTOMY    . BACK SURGERY    . CHOLECYSTECTOMY N/A 08/03/2013   Procedure: LAPAROSCOPIC CHOLECYSTECTOMY;  Surgeon: Dalia Heading, MD;  Location: [facility] ORS;  Service: General;  Laterality: N/A;  . COLPOSCOPY     CIN-1 Dr. Ralph Dowdy (GYN)/ HPV  . TONSILLECTOMY    . TUBAL LIGATION      OB History    Gravida Para Term Preterm AB Living   2 1 1   1 1    SAB  TAB Ectopic Multiple Live Births                   Home Medications    Prior to Admission medications   Medication Sig Start Date End Date Taking? Authorizing Provider  acetaminophen (TYLENOL) 500 MG tablet Take 500 mg by mouth every 6 (six) hours as needed for mild pain, moderate pain or fever.   Yes Historical Provider, MD  albuterol (PROVENTIL HFA;VENTOLIN HFA) 108 (90 BASE) MCG/ACT inhaler Inhale 2 puffs into the lungs every 6 (six) hours as needed for wheezing or shortness of breath.   Yes Historical Provider, MD  clonazePAM (KLONOPIN) 1 MG tablet Take 1 mg by mouth daily.   Yes Historical Provider, MD  Oxycodone HCl 10 MG TABS Take 20 mg by mouth 5 (five) times daily.    Yes Historical Provider, MD  tiZANidine (ZANAFLEX) 4 MG tablet Take 4 mg by mouth 2 (two) times daily.   Yes Historical Provider, MD    Family History Family History  Problem Relation Age of Onset  . Heart disease Father     MI  . Depression Father   . Hyperlipidemia Mother   . Heart disease Maternal Grandfather   . Hyperlipidemia Maternal Grandfather   .  Cancer Maternal Grandfather     Colon cancer    Social History Social History  Substance Use Topics  . Smoking status: Current Some Day Smoker    Packs/day: 0.50    Years: 10.00    Types: Cigarettes  . Smokeless tobacco: Never Used  . Alcohol use No   No illicit drug use  Allergies   Darvocet [propoxyphene n-acetaminophen] and Penicillins   Review of Systems Review of Systems  Constitutional: Negative.   HENT: Negative.   Respiratory: Negative.   Cardiovascular: Negative.   Gastrointestinal: Positive for nausea.  Genitourinary: Positive for flank pain and urgency.       Feeling of postvoid residual  Skin: Negative.   Neurological: Negative.   Psychiatric/Behavioral: Negative.   All other systems reviewed and are negative.    Physical Exam Updated Vital Signs BP (!) 153/106   Pulse 87   Temp 97.6 F (36.4 C) (Oral)   Resp 18    Ht 5\' 7"  (1.702 m)   Wt 176 lb (79.8 kg)   LMP 09/27/2013   SpO2 100%   BMI 27.57 kg/m   Physical Exam  Constitutional: She appears well-developed and well-nourished.  Mildly uncomfortable nontoxic-appearing  HENT:  Head: Normocephalic and atraumatic.  Eyes: Conjunctivae are normal. Pupils are equal, round, and reactive to light.  Neck: Neck supple. No tracheal deviation present. No thyromegaly present.  Cardiovascular: Normal rate and regular rhythm.   No murmur heard. Pulmonary/Chest: Effort normal and breath sounds normal.  Abdominal: Soft. Bowel sounds are normal. She exhibits no distension. There is no tenderness.  Genitourinary:  Genitourinary Comments: Left flank tenderness  Musculoskeletal: Normal range of motion. She exhibits no edema or tenderness.  Neurological: She is alert. Coordination normal.  Skin: Skin is warm and dry. No rash noted.  Psychiatric: She has a normal mood and affect.  Nursing note and vitals reviewed.    ED Treatments / Results  Labs (all labs ordered are listed, but only abnormal results are displayed) Labs Reviewed  URINALYSIS, ROUTINE W REFLEX MICROSCOPIC - Abnormal; Notable for the following:       Result Value   Specific Gravity, Urine >1.030 (*)    Hgb urine dipstick MODERATE (*)    Protein, ur TRACE (*)    All other components within normal limits  URINALYSIS, MICROSCOPIC (REFLEX) - Abnormal; Notable for the following:    Bacteria, UA MANY (*)    Squamous Epithelial / LPF 6-30 (*)    All other components within normal limits  BASIC METABOLIC PANEL   Results for orders placed or performed during the hospital encounter of 10/09/16  Urinalysis, Routine w reflex microscopic  Result Value Ref Range   Color, Urine YELLOW YELLOW   APPearance CLEAR CLEAR   Specific Gravity, Urine >1.030 (H) 1.005 - 1.030   pH 5.5 5.0 - 8.0   Glucose, UA NEGATIVE NEGATIVE mg/dL   Hgb urine dipstick MODERATE (A) NEGATIVE   Bilirubin Urine NEGATIVE  NEGATIVE   Ketones, ur NEGATIVE NEGATIVE mg/dL   Protein, ur TRACE (A) NEGATIVE mg/dL   Nitrite NEGATIVE NEGATIVE   Leukocytes, UA NEGATIVE NEGATIVE  Urinalysis, Microscopic (reflex)  Result Value Ref Range   RBC / HPF 6-30 0 - 5 RBC/hpf   WBC, UA 0-5 0 - 5 WBC/hpf   Bacteria, UA MANY (A) NONE SEEN   Squamous Epithelial / LPF 6-30 (A) NONE SEEN  Basic metabolic panel  Result Value Ref Range   Sodium 135 135 - 145 mmol/L  Potassium 3.8 3.5 - 5.1 mmol/L   Chloride 102 101 - 111 mmol/L   CO2 22 22 - 32 mmol/L   Glucose, Bld 102 (H) 65 - 99 mg/dL   BUN 8 6 - 20 mg/dL   Creatinine, Ser 1.61 0.44 - 1.00 mg/dL   Calcium 9.6 8.9 - 09.6 mg/dL   GFR calc non Af Amer >60 >60 mL/min   GFR calc Af Amer >60 >60 mL/min   Anion gap 11 5 - 15   Ct Renal Stone Study  Result Date: 10/09/2016 CLINICAL DATA:  Left flank pain and hematuria for 4 days. Dysuria. History of nephrolithiasis. Prior cholecystectomy and hysterectomy. EXAM: CT ABDOMEN AND PELVIS WITHOUT CONTRAST TECHNIQUE: Multidetector CT imaging of the abdomen and pelvis was performed following the standard protocol without IV contrast. COMPARISON:  03/12/2016 unenhanced CT abdomen/ pelvis. FINDINGS: Lower chest: No significant pulmonary nodules or acute consolidative airspace disease. Hepatobiliary: Normal liver with no liver mass. Cholecystectomy. Bile ducts are within expected post cholecystectomy limits and appear stable with common bile duct diameter 8 mm. No radiopaque choledocholithiasis. Pancreas: Normal, with no mass or duct dilation. Spleen: Normal size. No mass. Adrenals/Urinary Tract: Normal adrenals. No hydronephrosis. There are 6 nonobstructing right renal stones, largest 5 mm in the interpolar right kidney and 2 mm in the lower right kidney. There are 6 nonobstructing left renal stones, largest 4 mm in the lower left kidney. No contour deforming renal masses. Normal caliber ureters, with no ureteral stones. Nearly collapsed bladder  with no definite bladder wall thickening or bladder stones. Stomach/Bowel: Grossly normal stomach. Normal caliber small bowel with no small bowel wall thickening. Normal appendix. Normal large bowel with no diverticulosis, large bowel wall thickening or pericolonic fat stranding. Vascular/Lymphatic: Normal caliber abdominal aorta. No pathologically enlarged lymph nodes in the abdomen or pelvis. Reproductive: Status post hysterectomy, with no abnormal findings at the vaginal cuff. Simple 1.6 cm left adnexal cyst. Stable subcentimeter coarse right ovarian calcification, probably dystrophic. Otherwise no adnexal masses. Other: No pneumoperitoneum, ascites or focal fluid collection. Musculoskeletal: No aggressive appearing focal osseous lesions. IMPRESSION: 1. No acute abnormality. Nonobstructing bilateral nephrolithiasis. No hydronephrosis. No ureteral or bladder stones. 2. Simple 1.6 cm left adnexal cyst, for which no further follow-up is required. This recommendation follows ACR consensus guidelines: White Paper of the ACR Incidental Findings Committee II on Adnexal Findings. J Am Coll Radiol [phone]. Electronically Signed   By: Delbert Phenix M.D.   On: 10/09/2016 20:46    EKG  EKG Interpretation None       Radiology No results found.  Procedures Procedures (including critical care time)  Medications Ordered in ED Medications  ketorolac (TORADOL) 30 MG/ML injection 30 mg (30 mg Intravenous Given 10/09/16 1934)  metoCLOPramide (REGLAN) injection 10 mg (10 mg Intravenous Given 10/09/16 1934)     Initial Impression / Assessment and Plan / ED Course  I have reviewed the triage vital signs and the nursing notes.  Pertinent labs & imaging results that were available during my care of the patient were reviewed by me and considered in my medical decision making (see chart for details).     Post void residual measured with bladder scanner measured at anywhere from 0-16 mL 9:15 PM asymptomatic  pain-free and without nausea after treatment with intravenous Toradol and intravenous antiemetic. Patient feels as if she may have passed a kidney stone. Plan referral Dr.Eskidge Advil for pain. She also has oxycodone at home which she takes on a chronic basis I've  informed her about adnexal cyst Final Clinical Impressions(s) / ED Diagnoses  Diagnosis #1 left flank pain #2 microscopic hematuria Final diagnoses:  None  #3left ovarian cyst  New Prescriptions New Prescriptions   No medications on file     Doug Sou, MD 10/09/16 2135

## 2017-03-16 ENCOUNTER — Other Ambulatory Visit (HOSPITAL_COMMUNITY): Payer: Self-pay | Admitting: Family Medicine

## 2017-03-16 DIAGNOSIS — M545 Low back pain: Secondary | ICD-10-CM

## 2017-03-23 ENCOUNTER — Ambulatory Visit (HOSPITAL_COMMUNITY): Payer: Medicaid Other

## 2017-03-26 ENCOUNTER — Encounter (HOSPITAL_COMMUNITY): Payer: Self-pay

## 2017-03-26 ENCOUNTER — Ambulatory Visit (HOSPITAL_COMMUNITY)
Admission: RE | Admit: 2017-03-26 | Discharge: 2017-03-26 | Disposition: A | Discharge status: Home / Self Care | Payer: Medicaid Other | Source: Ambulatory Visit | Attending: Family Medicine | Admitting: Family Medicine

## 2017-03-26 ENCOUNTER — Ambulatory Visit (HOSPITAL_COMMUNITY): Payer: Medicaid Other

## 2017-04-03 ENCOUNTER — Ambulatory Visit (HOSPITAL_COMMUNITY): Admission: RE | Admit: 2017-04-03 | Payer: Medicaid Other | Source: Ambulatory Visit

## 2017-06-03 IMAGING — CT CT RENAL STONE PROTOCOL
3 of 4 series · 8 of 46 positions shown, 15 images · non-contrast
Comparison: July 17, 2014

CLINICAL DATA: Three-day history of left flank pain and hematuria

EXAM:
CT ABDOMEN AND PELVIS WITHOUT CONTRAST
TECHNIQUE: Multidetector CT imaging of the abdomen and pelvis was performed
following the standard protocol without oral or intravenous contrast
material administration.

[Series 3: mpr coronal (id) · coronal · 0.71mm/px · 3 of 102 slices shown, 4 images]
[im 34/102  soft-tissue]
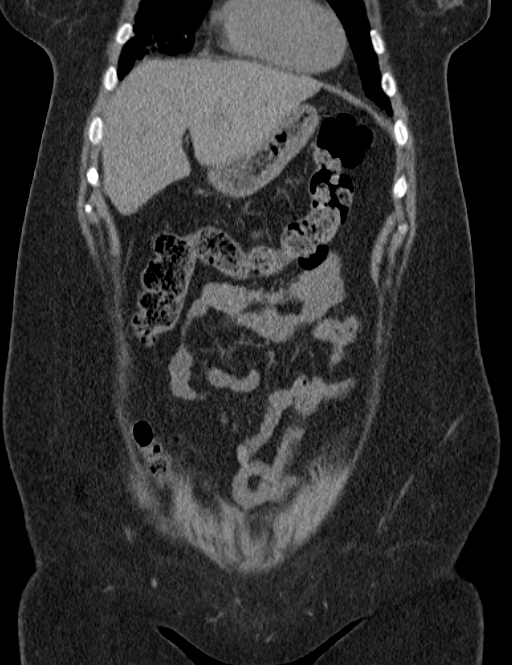
[im 45/102  soft-tissue]
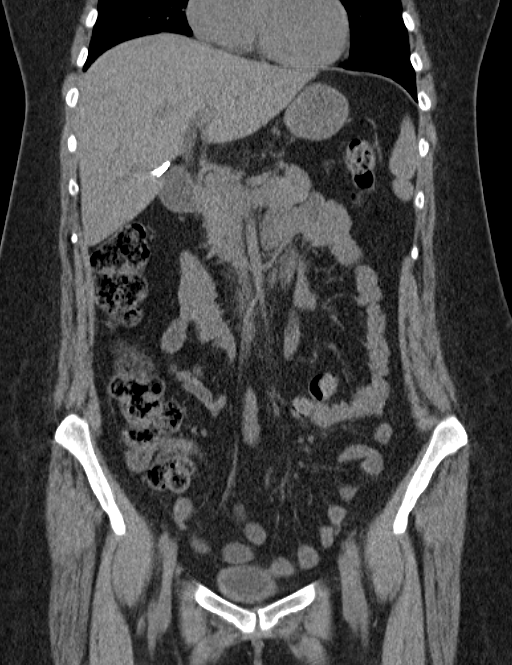
[im 45/102  bone]
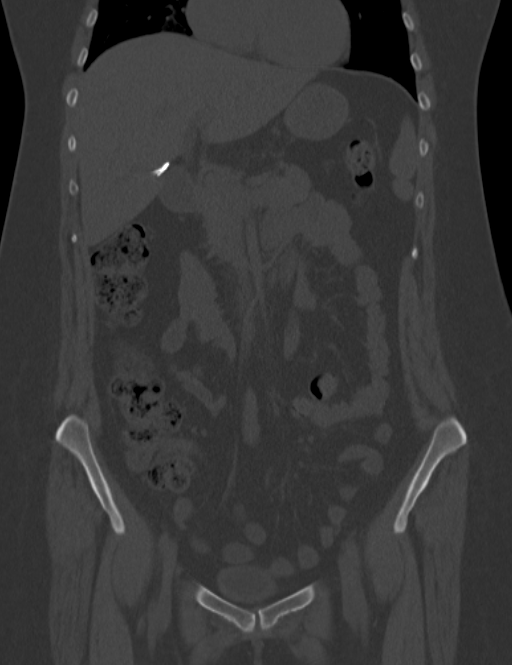
[im 57/102  soft-tissue]
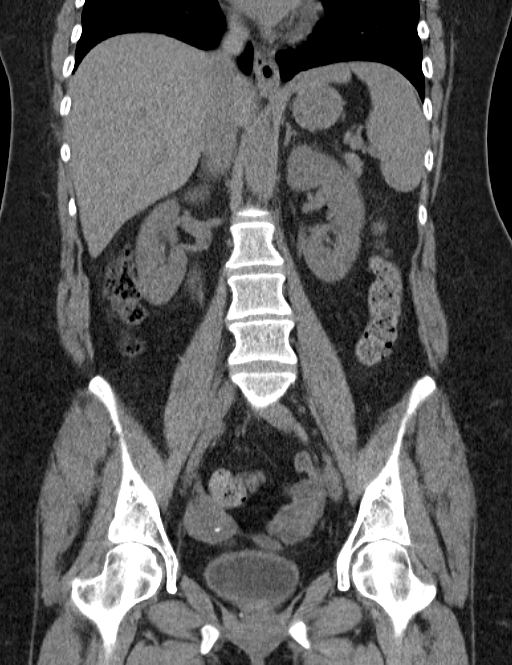

[Series 4: mpr sagittal (id) · sagittal · 0.58mm/px · 1 of 125 slices shown, 2 images]
[im 42/125  soft-tissue]
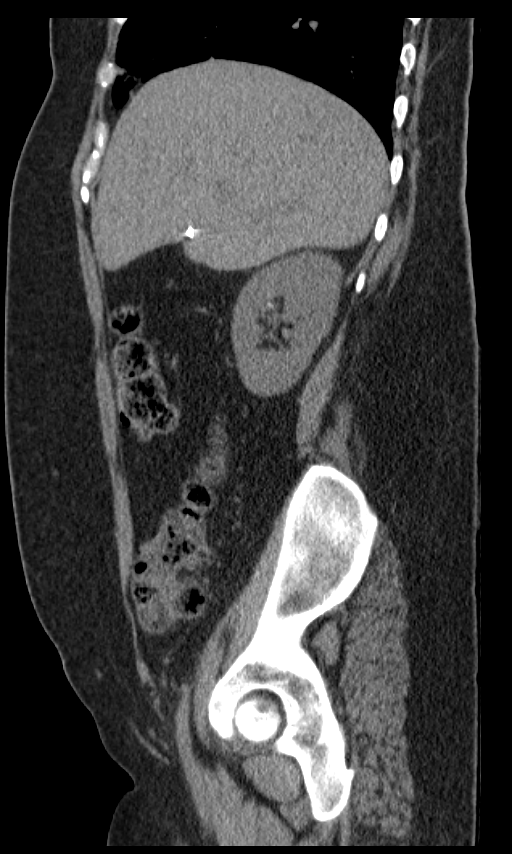
[im 42/125  bone]
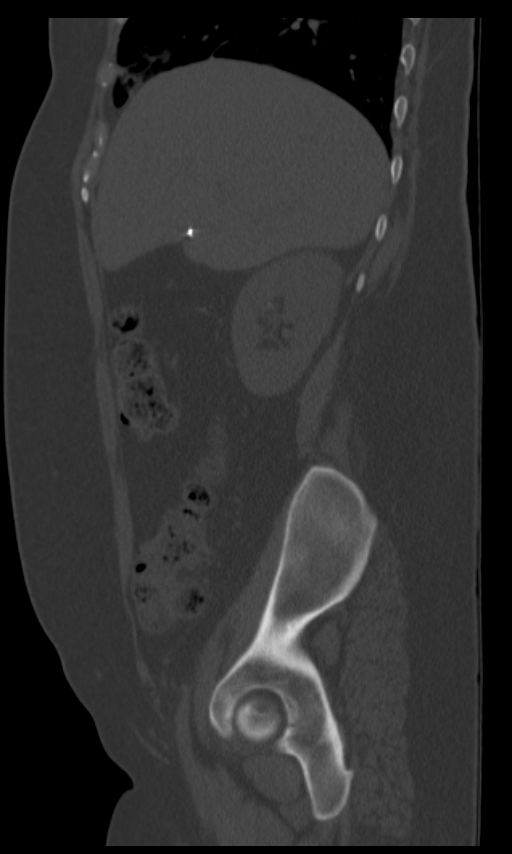

[Series 6: lung 5.0 b60f · axial · 0.78mm/px · z∈[+720,+780]mm · 4 of 22 slices shown, 9 images]
[im 5/22  soft-tissue]
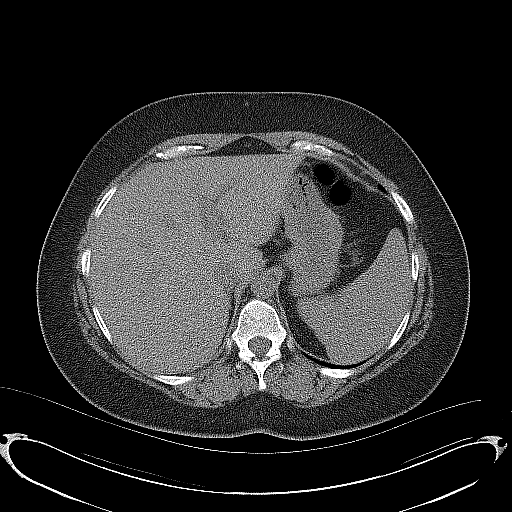
[im 5/22  lung]
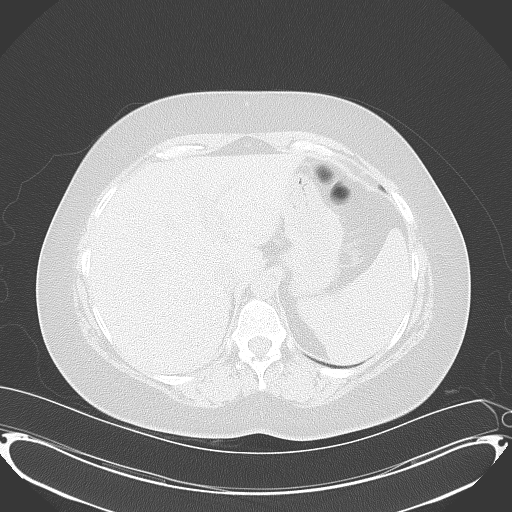
[im 5/22  bone]
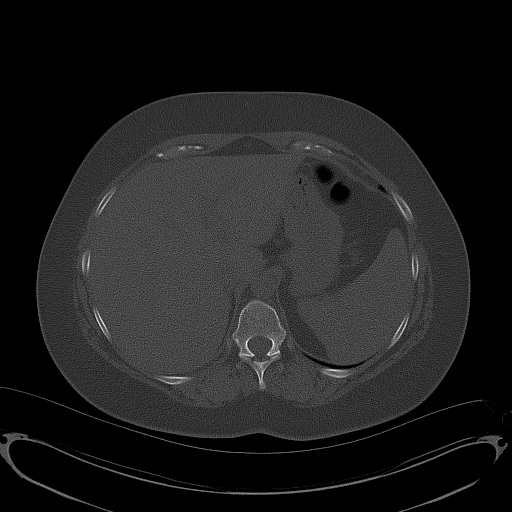
[im 9/22  soft-tissue]
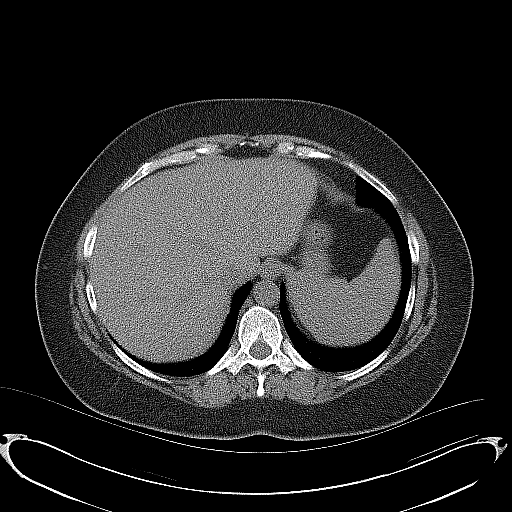
[im 9/22  lung]
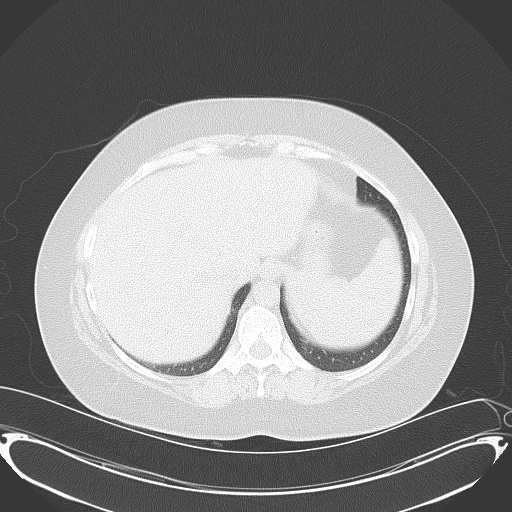
[im 13/22  soft-tissue]
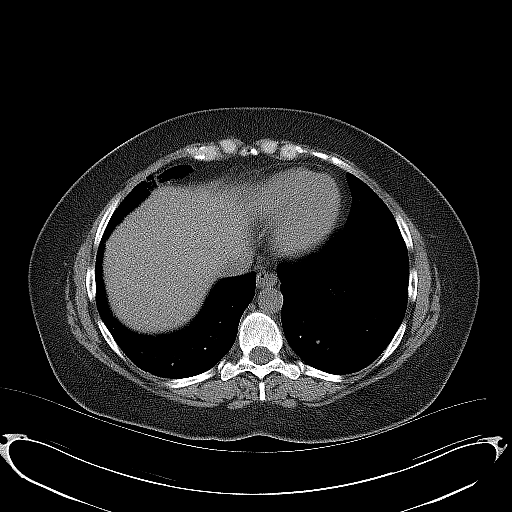
[im 13/22  lung]
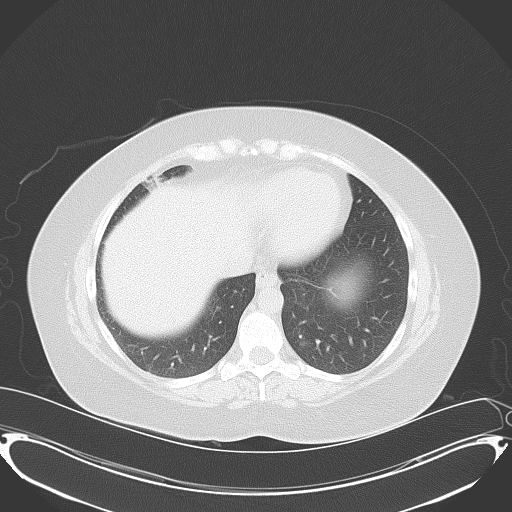
[im 17/22  soft-tissue]
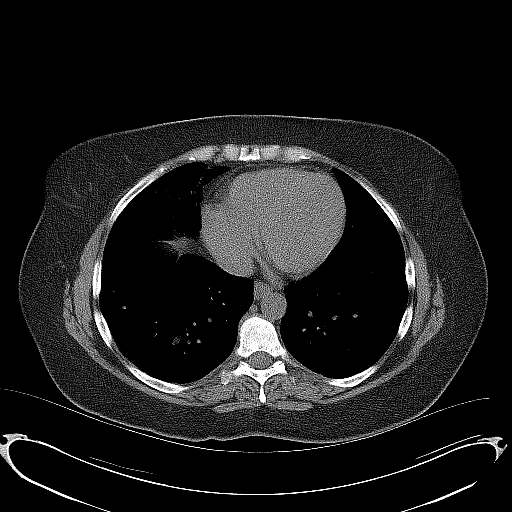
[im 17/22  lung]
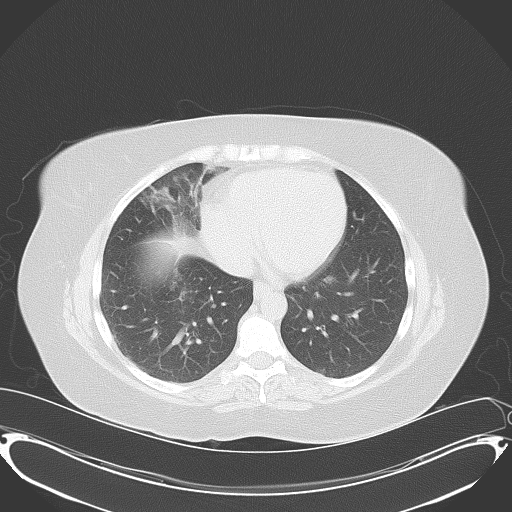

[8 of 46 positions shown; findings below may reference images not displayed]

FINDINGS: There is patchy airspace consolidation in the right middle lobe
medially. Lung bases are otherwise clear.

No focal liver lesions are identified on this noncontrast enhanced
study. Gallbladder is surgically absent. There is no biliary duct
dilatation.

Spleen, pancreas, and adrenals appear normal.

There is a 2 mm nonobstructing calculus in the upper pole the right
kidney. There is a 2 mm calculus with an adjacent 1 mm calculus in
the upper to midportion of the right kidney. On the left, there is a
1 mm calculus with a nearby 2 mm calculus in the mid to lower
portion. In the mid left kidney anteriorly, there is a 3 x 1 mm
calculus. In the lower pole left kidney, there is a tiny calculus
anteriorly. There is no renal mass or hydronephrosis on either side.
No ureteral calculi are identified on either side.

In the pelvis, the urinary bladder wall is upper normal in
thickness. The subtle interstitial infiltration adjacent to the
urinary bladder is present. These are findings concerning for a
degree of cystitis, not progressed from prior study. There is no
pelvic mass or pelvic fluid collection. Uterus is absent.

The appendix appears normal.

There is a minimal ventral hernia containing only fat.

There is no bowel obstruction. No free air or portal venous air.
There is no demonstrable ascites, adenopathy, or abscess in the
abdomen or pelvis. There is no abdominal aortic aneurysm. There are
no blastic or lytic bone lesions.
IMPRESSION: Airspace consolidation consistent with pneumonia, medial segment
right middle lobe.

Small nonobstructing calculi in each kidney. No hydronephrosis on
either side. No ureteral calculus on either side.

Suspect a degree of cystitis as was questioned on prior CT. There
has been no progression of the changes in the region of the urinary
bladder compared to the prior study. Correlation with urinalysis
would be advisable in this circumstance.

Uterus absent. Gallbladder absent. No bowel obstruction. No abscess.
Appendix appears normal. Minimal ventral hernia containing only fat.

## 2017-07-10 ENCOUNTER — Other Ambulatory Visit (HOSPITAL_COMMUNITY): Payer: Self-pay | Admitting: Family Medicine

## 2017-07-10 DIAGNOSIS — Z1231 Encounter for screening mammogram for malignant neoplasm of breast: Secondary | ICD-10-CM

## 2017-07-23 ENCOUNTER — Ambulatory Visit (HOSPITAL_COMMUNITY): Payer: Self-pay

## 2017-08-07 ENCOUNTER — Ambulatory Visit (HOSPITAL_COMMUNITY): Payer: Self-pay

## 2018-07-26 ENCOUNTER — Other Ambulatory Visit (HOSPITAL_COMMUNITY): Payer: Self-pay | Admitting: Internal Medicine

## 2018-07-26 DIAGNOSIS — N6310 Unspecified lump in the right breast, unspecified quadrant: Secondary | ICD-10-CM

## 2018-08-10 ENCOUNTER — Ambulatory Visit (HOSPITAL_COMMUNITY): Payer: Medicaid Other

## 2018-08-10 ENCOUNTER — Ambulatory Visit (HOSPITAL_COMMUNITY)
Admission: RE | Admit: 2018-08-10 | Discharge: 2018-08-10 | Disposition: A | Discharge status: Home / Self Care | Payer: Medicaid Other | Source: Ambulatory Visit | Attending: Internal Medicine | Admitting: Internal Medicine

## 2018-08-10 DIAGNOSIS — N6315 Unspecified lump in the right breast, overlapping quadrants: Secondary | ICD-10-CM | POA: Diagnosis not present

## 2018-08-10 DIAGNOSIS — N6310 Unspecified lump in the right breast, unspecified quadrant: Secondary | ICD-10-CM

## 2019-01-05 ENCOUNTER — Encounter (HOSPITAL_COMMUNITY): Payer: Self-pay

## 2019-01-05 ENCOUNTER — Emergency Department (HOSPITAL_COMMUNITY)
Admission: EM | Admit: 2019-01-05 | Discharge: 2019-01-05 | Disposition: A | Discharge status: Home / Self Care | Payer: Medicaid Other | Attending: Emergency Medicine | Admitting: Emergency Medicine

## 2019-01-05 ENCOUNTER — Other Ambulatory Visit: Payer: Self-pay

## 2019-01-05 DIAGNOSIS — Z79899 Other long term (current) drug therapy: Secondary | ICD-10-CM | POA: Diagnosis not present

## 2019-01-05 DIAGNOSIS — N898 Other specified noninflammatory disorders of vagina: Secondary | ICD-10-CM | POA: Diagnosis present

## 2019-01-05 DIAGNOSIS — N764 Abscess of vulva: Secondary | ICD-10-CM | POA: Diagnosis not present

## 2019-01-05 DIAGNOSIS — N760 Acute vaginitis: Secondary | ICD-10-CM | POA: Insufficient documentation

## 2019-01-05 DIAGNOSIS — F17210 Nicotine dependence, cigarettes, uncomplicated: Secondary | ICD-10-CM | POA: Insufficient documentation

## 2019-01-05 DIAGNOSIS — B9689 Other specified bacterial agents as the cause of diseases classified elsewhere: Secondary | ICD-10-CM

## 2019-01-05 HISTORY — DX: Other intervertebral disc degeneration, lumbar region without mention of lumbar back pain or lower extremity pain: M51.369

## 2019-01-05 HISTORY — DX: Other intervertebral disc degeneration, lumbar region: M51.36

## 2019-01-05 LAB — URINALYSIS, ROUTINE W REFLEX MICROSCOPIC
Bacteria, UA: NONE SEEN
Glucose, UA: NEGATIVE mg/dL
Ketones, ur: NEGATIVE mg/dL
Leukocytes,Ua: NEGATIVE
Nitrite: NEGATIVE
Protein, ur: NEGATIVE mg/dL
Specific Gravity, Urine: 1.036 — ABNORMAL HIGH (ref 1.005–1.030)
pH: 5 (ref 5.0–8.0)

## 2019-01-05 LAB — WET PREP, GENITAL
Sperm: NONE SEEN
Trich, Wet Prep: NONE SEEN
Yeast Wet Prep HPF POC: NONE SEEN

## 2019-01-05 MED ORDER — ONDANSETRON HCL 4 MG PO TABS
4.0000 mg | ORAL_TABLET | Freq: Once | ORAL | Status: AC
Start: 2019-01-05 — End: 2019-01-05
  Administered 2019-01-05: 4 mg via ORAL
  Filled 2019-01-05: qty 1

## 2019-01-05 MED ORDER — METRONIDAZOLE 500 MG PO TABS
500.0000 mg | ORAL_TABLET | Freq: Once | ORAL | Status: AC
  Administered 2019-01-05: 500 mg via ORAL
  Filled 2019-01-05: qty 1

## 2019-01-05 MED ORDER — HYDROCODONE-ACETAMINOPHEN 5-325 MG PO TABS
2.0000 | ORAL_TABLET | Freq: Once | ORAL | Status: AC
  Administered 2019-01-05: 2 via ORAL
  Filled 2019-01-05: qty 2

## 2019-01-05 MED ORDER — TRAMADOL HCL 50 MG PO TABS: 50.0000 mg | ORAL_TABLET | Freq: Four times a day (QID) | ORAL | 0 refills | Status: DC | PRN

## 2019-01-05 MED ORDER — DOXYCYCLINE HYCLATE 100 MG PO CAPS: 100.0000 mg | ORAL_CAPSULE | Freq: Two times a day (BID) | ORAL | 0 refills | Status: DC

## 2019-01-05 MED ORDER — METRONIDAZOLE 500 MG PO TABS: 500.0000 mg | ORAL_TABLET | Freq: Two times a day (BID) | ORAL | 0 refills | Status: DC

## 2019-01-05 NOTE — ED Provider Notes (Signed)
[facility] EMERGENCY DEPARTMENT Provider Note   CSN: [id number] Arrival date & time: 01/05/19  1030    History   Chief Complaint Chief Complaint  Patient presents with  . Vaginal Pain    HPI [name] is a 43 y.o. female.     The history is provided by the patient.  Vaginal Pain  This is a new problem. The current episode started more than 1 week ago. The problem occurs daily. The problem has been gradually worsening. Associated symptoms include chest pain. Pertinent negatives include no abdominal pain, no headaches and no shortness of breath. The symptoms are aggravated by walking (standing and urinating). Nothing relieves the symptoms. She has tried a warm compress (bactrim) for the symptoms. The treatment provided no relief.    Past Medical History:  Diagnosis Date  . Anxiety   . Chronic back pain   . Chronic pain   . DDD (degenerative disc disease), lumbar   . Depression   . DJD (degenerative joint disease), lumbar   . Kidney stone   . Lymph node abscess    s/p removal  . Migraine   . Neck fracture (HCC)    C1  . Ovarian cyst    Followed by GYN Dr. Ralph Dowdy  . Pain management   . PONV (postoperative nausea and vomiting)   . Seizures [facility])     Patient Active Problem List   Diagnosis Date Noted  . Closed right fibular fracture 10/11/2013  . Narcotic abuse (HCC) 08/31/2013  . Abdominal pain 07/30/2013  . Transaminitis 07/30/2013  . Acute bronchitis 05/19/2013  . Depression with anxiety 01/10/2013  . Diarrhea 10/27/2011  . Seizure (HCC) 09/29/2011  . Anxiety 07/01/2011  . NECK PAIN 12/14/2006  . RADICULOPATHY 12/14/2006  . DEGENERATIVE DISC DISEASE, LUMBAR SPINE 11/25/2006  . OBESITY NOS 10/26/2006  . COMMON MIGRAINE 10/26/2006  . LOW BACK PAIN, CHRONIC 10/26/2006    Past Surgical History:  Procedure Laterality Date  . ABDOMINAL HYSTERECTOMY    . BACK SURGERY    . CHOLECYSTECTOMY N/A 08/03/2013   Procedure: LAPAROSCOPIC CHOLECYSTECTOMY;   Surgeon: Dalia Heading, MD;  Location: [facility] ORS;  Service: General;  Laterality: N/A;  . COLPOSCOPY     CIN-1 Dr. Ralph Dowdy (GYN)/ HPV  . TONSILLECTOMY    . TUBAL LIGATION       OB History    Gravida  2   Para  1   Term  1   Preterm      AB  1   Living  1     SAB      TAB      Ectopic      Multiple      Live Births               Home Medications    Prior to Admission medications   Medication Sig Start Date End Date Taking? Authorizing Provider  acetaminophen (TYLENOL) 500 MG tablet Take 500 mg by mouth every 6 (six) hours as needed for mild pain, moderate pain or fever.    [provider]  albuterol (PROVENTIL HFA;VENTOLIN HFA) 108 (90 BASE) MCG/ACT inhaler Inhale 2 puffs into the lungs every 6 (six) hours as needed for wheezing or shortness of breath.    [provider]  clonazePAM (KLONOPIN) 1 MG tablet Take 1 mg by mouth daily.    [provider]  Oxycodone HCl 10 MG TABS Take 20 mg by mouth 5 (five) times daily.  [provider]  tiZANidine (ZANAFLEX) 4 MG tablet Take 4 mg by mouth 2 (two) times daily.    [provider]    Family History Family History  Problem Relation Age of Onset  . Heart disease Father        MI  . Depression Father   . Hyperlipidemia Mother   . Heart disease Maternal Grandfather   . Hyperlipidemia Maternal Grandfather   . Cancer Maternal Grandfather        Colon cancer    Social History Social History   Tobacco Use  . Smoking status: Current Every Day Smoker    Packs/day: 0.50    Years: 10.00    Pack years: 5.00    Types: Cigarettes  . Smokeless tobacco: Never Used  Substance Use Topics  . Alcohol use: No  . Drug use: No     Allergies   Darvocet [propoxyphene n-acetaminophen] and Penicillins   Review of Systems Review of Systems  Constitutional: Negative for activity change.       All ROS Neg except as noted in HPI  HENT: Negative for nosebleeds.   Eyes: Negative  for photophobia and discharge.  Respiratory: Negative for cough, shortness of breath and wheezing.   Cardiovascular: Positive for chest pain. Negative for palpitations.  Gastrointestinal: Negative for abdominal pain and blood in stool.  Genitourinary: Positive for dysuria and vaginal pain. Negative for frequency and hematuria.  Musculoskeletal: Negative for arthralgias, back pain and neck pain.  Skin: Negative.   Neurological: Negative for dizziness, seizures, speech difficulty and headaches.  Psychiatric/Behavioral: Negative for confusion and hallucinations.     Physical Exam Updated Vital Signs Pulse 90   Temp 98.1 F (36.7 C) (Oral)   Resp 18   Ht  (1.676 m)   Wt 74.8 kg   LMP 09/27/2013   SpO2 97%   BMI 26.63 kg/m   Physical Exam Vitals signs and nursing note reviewed. Exam conducted with a chaperone present.  Constitutional:      Appearance: She is well-developed. She is not toxic-appearing.  HENT:     Head: Normocephalic.     Right Ear: Tympanic membrane and external ear normal.     Left Ear: Tympanic membrane and external ear normal.  Eyes:     General: Lids are normal.     Pupils: Pupils are equal, round, and reactive to light.  Neck:     Musculoskeletal: Normal range of motion and neck supple.     Vascular: No carotid bruit.  Cardiovascular:     Rate and Rhythm: Normal rate and regular rhythm.     Pulses: Normal pulses.     Heart sounds: Normal heart sounds.  Pulmonary:     Effort: No respiratory distress.     Breath sounds: Normal breath sounds.  Abdominal:     General: Bowel sounds are normal.     Palpations: Abdomen is soft.     Tenderness: There is no abdominal tenderness. There is no guarding.     Hernia: There is no hernia in the right inguinal area or left inguinal area.  Genitourinary:    Exam position: Lithotomy position.     Pubic Area: No rash.      Labia:        Right: No tenderness or injury.        Left: Tenderness present. No  injury.      Urethra: No urethral pain or urethral swelling.     Vagina: No foreign body. Vaginal  discharge, erythema and tenderness present.     Uterus: Absent.      Adnexa: Right adnexa normal and left adnexa normal.     Rectum: Normal.     Comments: Chaperone present during the examination.  There is a small raised area on the left internal labia that is tender to touch.  Just behind the internal left labia is a being size raised area that is tender to touch.  No pustule or head noted. There is a mild to moderate white to beige discharge in the vaginal vault.  No foreign body appreciated.  No adnexal tenderness, no adnexal mass or swelling. Musculoskeletal: Normal range of motion.  Lymphadenopathy:     Head:     Right side of head: No submandibular adenopathy.     Left side of head: No submandibular adenopathy.     Cervical: No cervical adenopathy.     Lower Body: No right inguinal adenopathy. No left inguinal adenopathy.  Skin:    General: Skin is warm and dry.  Neurological:     Mental Status: She is alert and oriented to person, place, and time.     Cranial Nerves: No cranial nerve deficit.     Sensory: No sensory deficit.  Psychiatric:        Speech: Speech normal.      ED Treatments / Results  Labs (all labs ordered are listed, but only abnormal results are displayed) Labs Reviewed  WET PREP, GENITAL  URINALYSIS, ROUTINE W REFLEX MICROSCOPIC  GC/CHLAMYDIA PROBE AMP ([place]    EKG None  Radiology No results found.  Procedures Procedures (including critical care time)  Medications Ordered in ED Medications  HYDROcodone-acetaminophen (NORCO/VICODIN) 5-325 MG per tablet 2 tablet (has no administration in time range)  ondansetron (ZOFRAN) tablet 4 mg (has no administration in time range)     Initial Impression / Assessment and Plan / ED Course  I have reviewed the triage vital signs and the nursing notes.  Pertinent labs & imaging results  that were available during my care of the patient were reviewed by me and considered in my medical decision making (see chart for details).          Final Clinical Impressions(s) / ED Diagnoses MDM  Patient has been experiencing irritation and some pain of the left internal labia for approximately 1-1/2 weeks.  The patient has some discomfort with urination, more discomfort with wiping.  She has not noticed any evidence of a prolapsed bladder or other prolapse issue.  No fever or chills reported.  No unusual vaginal bleeding.  It is of note that the patient had a hysterectomy in 2015.  Urinalysis shows a specific gravity is elevated at 1.036.  The leukocyte esterase and nitrates are both negative.  Wet prep shows clue cells and a few white blood cells.  The examination and lab work suggest small abscess of the left internal labia. There is also evidence of bacterial vaginosis.  Patient will be treated with doxycycline and Flagyl.  Patient is to follow-up with her primary physician or GYN physician concerning this.  She will return to the emergency department if any high fever, pus like drainage, or signs of advancing infection.   Final diagnoses:  Abscess of left genital labia  Bacterial vaginosis    ED Discharge Orders         Ordered    metroNIDAZOLE (FLAGYL) 500 MG tablet  2 times daily     01/05/19 1229  traMADol (ULTRAM) 50 MG tablet  Every 6 hours PRN     01/05/19 1229    doxycycline (VIBRAMYCIN) 100 MG capsule  2 times daily     01/05/19 1229           Ivery Quale, PA-C 01/05/19 1232    Raeford Razor, MD 01/06/19 1054

## 2019-01-05 NOTE — Discharge Instructions (Addendum)
Your vital signs are within normal limits.  Your examination suggest a small abscess of the left internal labia.  This is not a candidate for incision and drainage at this time.  Your lab work shows a bacterial vaginosis.  Please use metronidazole 2 times daily with a meal.  Please use doxycycline 2 times daily with a meal.  Use Tylenol extra strength every 4 hours for mild pain.  Use Ultram 1 or 2 tablets every 6 hours for more severe pain. This medication may cause drowsiness. Please do not drink, drive, or participate in activity that requires concentration while taking this medication. Please see Dr Margo Aye or your Gyn MD for follow up of this issue.

## 2019-01-05 NOTE — ED Triage Notes (Signed)
Pt reports a mass coming out of vagina approx 1 1/2 week. Area is getting larger. Denies drainage

## 2019-01-06 LAB — GC/CHLAMYDIA PROBE AMP (~~LOC~~) NOT AT ARMC
Chlamydia: NEGATIVE
Neisseria Gonorrhea: NEGATIVE

## 2019-12-05 ENCOUNTER — Ambulatory Visit (HOSPITAL_COMMUNITY): Payer: Medicaid Other | Admitting: Physical Therapy

## 2019-12-05 ENCOUNTER — Encounter (HOSPITAL_COMMUNITY): Payer: Self-pay

## 2019-12-07 ENCOUNTER — Ambulatory Visit (HOSPITAL_COMMUNITY): Payer: Medicaid Other | Attending: Neurosurgery | Admitting: Physical Therapy

## 2020-06-01 DIAGNOSIS — Z79899 Other long term (current) drug therapy: Secondary | ICD-10-CM | POA: Diagnosis not present

## 2020-06-01 DIAGNOSIS — F17210 Nicotine dependence, cigarettes, uncomplicated: Secondary | ICD-10-CM | POA: Diagnosis not present

## 2020-06-01 DIAGNOSIS — M545 Low back pain: Secondary | ICD-10-CM | POA: Diagnosis not present

## 2020-06-01 DIAGNOSIS — M542 Cervicalgia: Secondary | ICD-10-CM | POA: Diagnosis not present

## 2020-06-01 DIAGNOSIS — G8929 Other chronic pain: Secondary | ICD-10-CM | POA: Diagnosis not present

## 2020-07-05 DIAGNOSIS — M542 Cervicalgia: Secondary | ICD-10-CM | POA: Diagnosis not present

## 2020-07-05 DIAGNOSIS — F17210 Nicotine dependence, cigarettes, uncomplicated: Secondary | ICD-10-CM | POA: Diagnosis not present

## 2020-07-05 DIAGNOSIS — M545 Low back pain, unspecified: Secondary | ICD-10-CM | POA: Diagnosis not present

## 2020-07-05 DIAGNOSIS — Z79899 Other long term (current) drug therapy: Secondary | ICD-10-CM | POA: Diagnosis not present

## 2020-07-05 DIAGNOSIS — G8929 Other chronic pain: Secondary | ICD-10-CM | POA: Diagnosis not present

## 2020-08-03 DIAGNOSIS — F17210 Nicotine dependence, cigarettes, uncomplicated: Secondary | ICD-10-CM | POA: Diagnosis not present

## 2020-08-03 DIAGNOSIS — M545 Low back pain, unspecified: Secondary | ICD-10-CM | POA: Diagnosis not present

## 2020-08-03 DIAGNOSIS — G8929 Other chronic pain: Secondary | ICD-10-CM | POA: Diagnosis not present

## 2020-08-03 DIAGNOSIS — Z79899 Other long term (current) drug therapy: Secondary | ICD-10-CM | POA: Diagnosis not present

## 2020-08-03 DIAGNOSIS — M542 Cervicalgia: Secondary | ICD-10-CM | POA: Diagnosis not present

## 2020-11-01 DIAGNOSIS — Z79899 Other long term (current) drug therapy: Secondary | ICD-10-CM | POA: Diagnosis not present

## 2020-11-30 DIAGNOSIS — Z79899 Other long term (current) drug therapy: Secondary | ICD-10-CM | POA: Diagnosis not present

## 2020-12-31 DIAGNOSIS — M542 Cervicalgia: Secondary | ICD-10-CM | POA: Diagnosis not present

## 2020-12-31 DIAGNOSIS — Z79899 Other long term (current) drug therapy: Secondary | ICD-10-CM | POA: Diagnosis not present

## 2020-12-31 DIAGNOSIS — F332 Major depressive disorder, recurrent severe without psychotic features: Secondary | ICD-10-CM | POA: Diagnosis not present

## 2020-12-31 DIAGNOSIS — M545 Low back pain, unspecified: Secondary | ICD-10-CM | POA: Diagnosis not present

## 2020-12-31 DIAGNOSIS — G8929 Other chronic pain: Secondary | ICD-10-CM | POA: Diagnosis not present

## 2021-01-09 DIAGNOSIS — Z131 Encounter for screening for diabetes mellitus: Secondary | ICD-10-CM | POA: Diagnosis not present

## 2021-01-09 DIAGNOSIS — R110 Nausea: Secondary | ICD-10-CM | POA: Diagnosis not present

## 2021-01-09 DIAGNOSIS — E785 Hyperlipidemia, unspecified: Secondary | ICD-10-CM | POA: Diagnosis not present

## 2021-01-09 DIAGNOSIS — Z79891 Long term (current) use of opiate analgesic: Secondary | ICD-10-CM | POA: Diagnosis not present

## 2021-01-09 DIAGNOSIS — D72819 Decreased white blood cell count, unspecified: Secondary | ICD-10-CM | POA: Diagnosis not present

## 2021-01-09 DIAGNOSIS — R5383 Other fatigue: Secondary | ICD-10-CM | POA: Diagnosis not present

## 2021-01-15 ENCOUNTER — Other Ambulatory Visit (HOSPITAL_COMMUNITY): Payer: Self-pay | Admitting: Family Medicine

## 2021-01-15 DIAGNOSIS — M79671 Pain in right foot: Secondary | ICD-10-CM

## 2021-01-28 DIAGNOSIS — G8929 Other chronic pain: Secondary | ICD-10-CM | POA: Diagnosis not present

## 2021-01-28 DIAGNOSIS — M542 Cervicalgia: Secondary | ICD-10-CM | POA: Diagnosis not present

## 2021-01-28 DIAGNOSIS — M545 Low back pain, unspecified: Secondary | ICD-10-CM | POA: Diagnosis not present

## 2021-01-28 DIAGNOSIS — Z79899 Other long term (current) drug therapy: Secondary | ICD-10-CM | POA: Diagnosis not present

## 2021-01-28 DIAGNOSIS — E669 Obesity, unspecified: Secondary | ICD-10-CM | POA: Diagnosis not present

## 2021-02-26 DIAGNOSIS — F332 Major depressive disorder, recurrent severe without psychotic features: Secondary | ICD-10-CM | POA: Diagnosis not present

## 2021-02-26 DIAGNOSIS — G8929 Other chronic pain: Secondary | ICD-10-CM | POA: Diagnosis not present

## 2021-02-26 DIAGNOSIS — M542 Cervicalgia: Secondary | ICD-10-CM | POA: Diagnosis not present

## 2021-02-26 DIAGNOSIS — M545 Low back pain, unspecified: Secondary | ICD-10-CM | POA: Diagnosis not present

## 2021-02-26 DIAGNOSIS — Z79899 Other long term (current) drug therapy: Secondary | ICD-10-CM | POA: Diagnosis not present

## 2021-03-28 DIAGNOSIS — M545 Low back pain, unspecified: Secondary | ICD-10-CM | POA: Diagnosis not present

## 2021-03-28 DIAGNOSIS — G8929 Other chronic pain: Secondary | ICD-10-CM | POA: Diagnosis not present

## 2021-03-28 DIAGNOSIS — M542 Cervicalgia: Secondary | ICD-10-CM | POA: Diagnosis not present

## 2021-03-28 DIAGNOSIS — Z79899 Other long term (current) drug therapy: Secondary | ICD-10-CM | POA: Diagnosis not present

## 2021-03-28 DIAGNOSIS — F332 Major depressive disorder, recurrent severe without psychotic features: Secondary | ICD-10-CM | POA: Diagnosis not present

## 2021-04-01 DIAGNOSIS — Z79899 Other long term (current) drug therapy: Secondary | ICD-10-CM | POA: Diagnosis not present

## 2021-04-30 DIAGNOSIS — F332 Major depressive disorder, recurrent severe without psychotic features: Secondary | ICD-10-CM | POA: Diagnosis not present

## 2021-04-30 DIAGNOSIS — G8929 Other chronic pain: Secondary | ICD-10-CM | POA: Diagnosis not present

## 2021-04-30 DIAGNOSIS — Z79899 Other long term (current) drug therapy: Secondary | ICD-10-CM | POA: Diagnosis not present

## 2021-04-30 DIAGNOSIS — M542 Cervicalgia: Secondary | ICD-10-CM | POA: Diagnosis not present

## 2021-04-30 DIAGNOSIS — M545 Low back pain, unspecified: Secondary | ICD-10-CM | POA: Diagnosis not present

## 2021-05-24 DIAGNOSIS — M545 Low back pain, unspecified: Secondary | ICD-10-CM | POA: Diagnosis not present

## 2021-05-24 DIAGNOSIS — G8929 Other chronic pain: Secondary | ICD-10-CM | POA: Diagnosis not present

## 2021-05-24 DIAGNOSIS — Z79899 Other long term (current) drug therapy: Secondary | ICD-10-CM | POA: Diagnosis not present

## 2021-05-24 DIAGNOSIS — F332 Major depressive disorder, recurrent severe without psychotic features: Secondary | ICD-10-CM | POA: Diagnosis not present

## 2021-05-24 DIAGNOSIS — M542 Cervicalgia: Secondary | ICD-10-CM | POA: Diagnosis not present

## 2021-06-26 DIAGNOSIS — M542 Cervicalgia: Secondary | ICD-10-CM | POA: Diagnosis not present

## 2021-06-26 DIAGNOSIS — Z79899 Other long term (current) drug therapy: Secondary | ICD-10-CM | POA: Diagnosis not present

## 2021-06-26 DIAGNOSIS — F332 Major depressive disorder, recurrent severe without psychotic features: Secondary | ICD-10-CM | POA: Diagnosis not present

## 2021-06-26 DIAGNOSIS — M545 Low back pain, unspecified: Secondary | ICD-10-CM | POA: Diagnosis not present

## 2021-06-26 DIAGNOSIS — G8929 Other chronic pain: Secondary | ICD-10-CM | POA: Diagnosis not present

## 2021-07-24 DIAGNOSIS — M545 Low back pain, unspecified: Secondary | ICD-10-CM | POA: Diagnosis not present

## 2021-07-24 DIAGNOSIS — Z79899 Other long term (current) drug therapy: Secondary | ICD-10-CM | POA: Diagnosis not present

## 2021-07-24 DIAGNOSIS — F332 Major depressive disorder, recurrent severe without psychotic features: Secondary | ICD-10-CM | POA: Diagnosis not present

## 2021-07-24 DIAGNOSIS — G8929 Other chronic pain: Secondary | ICD-10-CM | POA: Diagnosis not present

## 2021-07-24 DIAGNOSIS — M542 Cervicalgia: Secondary | ICD-10-CM | POA: Diagnosis not present

## 2021-08-05 DIAGNOSIS — R7301 Impaired fasting glucose: Secondary | ICD-10-CM | POA: Diagnosis not present

## 2021-08-05 DIAGNOSIS — E785 Hyperlipidemia, unspecified: Secondary | ICD-10-CM | POA: Diagnosis not present

## 2021-08-08 ENCOUNTER — Other Ambulatory Visit (HOSPITAL_COMMUNITY): Payer: Self-pay | Admitting: Family Medicine

## 2021-08-08 DIAGNOSIS — Z1231 Encounter for screening mammogram for malignant neoplasm of breast: Secondary | ICD-10-CM

## 2021-08-22 DIAGNOSIS — F32A Depression, unspecified: Secondary | ICD-10-CM | POA: Diagnosis not present

## 2021-08-22 DIAGNOSIS — Z20822 Contact with and (suspected) exposure to covid-19: Secondary | ICD-10-CM | POA: Diagnosis not present

## 2021-08-22 DIAGNOSIS — Z3202 Encounter for pregnancy test, result negative: Secondary | ICD-10-CM | POA: Diagnosis not present

## 2021-08-24 DIAGNOSIS — Z79899 Other long term (current) drug therapy: Secondary | ICD-10-CM | POA: Diagnosis not present

## 2021-08-24 DIAGNOSIS — M542 Cervicalgia: Secondary | ICD-10-CM | POA: Diagnosis not present

## 2021-08-24 DIAGNOSIS — F332 Major depressive disorder, recurrent severe without psychotic features: Secondary | ICD-10-CM | POA: Diagnosis not present

## 2021-08-24 DIAGNOSIS — M545 Low back pain, unspecified: Secondary | ICD-10-CM | POA: Diagnosis not present

## 2021-08-24 DIAGNOSIS — G8929 Other chronic pain: Secondary | ICD-10-CM | POA: Diagnosis not present

## 2021-09-21 DIAGNOSIS — M542 Cervicalgia: Secondary | ICD-10-CM | POA: Diagnosis not present

## 2021-09-21 DIAGNOSIS — G8929 Other chronic pain: Secondary | ICD-10-CM | POA: Diagnosis not present

## 2021-09-21 DIAGNOSIS — F332 Major depressive disorder, recurrent severe without psychotic features: Secondary | ICD-10-CM | POA: Diagnosis not present

## 2021-09-21 DIAGNOSIS — M545 Low back pain, unspecified: Secondary | ICD-10-CM | POA: Diagnosis not present

## 2021-09-21 DIAGNOSIS — Z79899 Other long term (current) drug therapy: Secondary | ICD-10-CM | POA: Diagnosis not present

## 2021-09-25 DIAGNOSIS — Z79899 Other long term (current) drug therapy: Secondary | ICD-10-CM | POA: Diagnosis not present

## 2021-10-03 DIAGNOSIS — F4322 Adjustment disorder with anxiety: Secondary | ICD-10-CM | POA: Diagnosis not present

## 2021-10-19 DIAGNOSIS — G8929 Other chronic pain: Secondary | ICD-10-CM | POA: Diagnosis not present

## 2021-10-19 DIAGNOSIS — M542 Cervicalgia: Secondary | ICD-10-CM | POA: Diagnosis not present

## 2021-10-19 DIAGNOSIS — M545 Low back pain, unspecified: Secondary | ICD-10-CM | POA: Diagnosis not present

## 2021-10-19 DIAGNOSIS — Z79899 Other long term (current) drug therapy: Secondary | ICD-10-CM | POA: Diagnosis not present

## 2021-10-19 DIAGNOSIS — F332 Major depressive disorder, recurrent severe without psychotic features: Secondary | ICD-10-CM | POA: Diagnosis not present

## 2021-11-19 DIAGNOSIS — M542 Cervicalgia: Secondary | ICD-10-CM | POA: Diagnosis not present

## 2021-11-19 DIAGNOSIS — Z79899 Other long term (current) drug therapy: Secondary | ICD-10-CM | POA: Diagnosis not present

## 2021-11-19 DIAGNOSIS — F332 Major depressive disorder, recurrent severe without psychotic features: Secondary | ICD-10-CM | POA: Diagnosis not present

## 2021-11-19 DIAGNOSIS — M545 Low back pain, unspecified: Secondary | ICD-10-CM | POA: Diagnosis not present

## 2021-11-19 DIAGNOSIS — G8929 Other chronic pain: Secondary | ICD-10-CM | POA: Diagnosis not present

## 2021-12-19 DIAGNOSIS — F332 Major depressive disorder, recurrent severe without psychotic features: Secondary | ICD-10-CM | POA: Diagnosis not present

## 2021-12-19 DIAGNOSIS — Z79899 Other long term (current) drug therapy: Secondary | ICD-10-CM | POA: Diagnosis not present

## 2021-12-19 DIAGNOSIS — G8929 Other chronic pain: Secondary | ICD-10-CM | POA: Diagnosis not present

## 2021-12-19 DIAGNOSIS — M545 Low back pain, unspecified: Secondary | ICD-10-CM | POA: Diagnosis not present

## 2021-12-19 DIAGNOSIS — M542 Cervicalgia: Secondary | ICD-10-CM | POA: Diagnosis not present

## 2021-12-26 ENCOUNTER — Other Ambulatory Visit (HOSPITAL_COMMUNITY): Payer: Self-pay | Admitting: Family Medicine

## 2021-12-26 DIAGNOSIS — S99821A Other specified injuries of right foot, initial encounter: Secondary | ICD-10-CM

## 2022-01-17 DIAGNOSIS — M542 Cervicalgia: Secondary | ICD-10-CM | POA: Diagnosis not present

## 2022-01-17 DIAGNOSIS — Z79899 Other long term (current) drug therapy: Secondary | ICD-10-CM | POA: Diagnosis not present

## 2022-01-17 DIAGNOSIS — M545 Low back pain, unspecified: Secondary | ICD-10-CM | POA: Diagnosis not present

## 2022-01-17 DIAGNOSIS — F332 Major depressive disorder, recurrent severe without psychotic features: Secondary | ICD-10-CM | POA: Diagnosis not present

## 2022-01-17 DIAGNOSIS — G8929 Other chronic pain: Secondary | ICD-10-CM | POA: Diagnosis not present

## 2022-01-17 DIAGNOSIS — Z6832 Body mass index (BMI) 32.0-32.9, adult: Secondary | ICD-10-CM | POA: Diagnosis not present

## 2022-01-30 ENCOUNTER — Emergency Department (HOSPITAL_COMMUNITY)
Admission: EM | Admit: 2022-01-30 | Discharge: 2022-01-30 | Disposition: A | Discharge status: Home / Self Care | Payer: Medicaid Other | Attending: Emergency Medicine | Admitting: Emergency Medicine

## 2022-01-30 ENCOUNTER — Other Ambulatory Visit: Payer: Self-pay

## 2022-01-30 ENCOUNTER — Encounter (HOSPITAL_COMMUNITY): Payer: Self-pay | Admitting: Emergency Medicine

## 2022-01-30 DIAGNOSIS — L03032 Cellulitis of left toe: Secondary | ICD-10-CM | POA: Diagnosis not present

## 2022-01-30 DIAGNOSIS — M79675 Pain in left toe(s): Secondary | ICD-10-CM | POA: Diagnosis present

## 2022-01-30 MED ORDER — KETOROLAC TROMETHAMINE 10 MG PO TABS
10.0000 mg | ORAL_TABLET | Freq: Once | ORAL | Status: AC
Start: 2022-01-30 — End: 2022-01-30
  Administered 2022-01-30: 10 mg via ORAL
  Filled 2022-01-30: qty 1

## 2022-01-30 MED ORDER — TRIPLE ANTIBIOTIC 5-400-5000 EX OINT: TOPICAL_OINTMENT | Freq: Four times a day (QID) | CUTANEOUS | 0 refills | Status: DC

## 2022-01-30 MED ORDER — DOXYCYCLINE HYCLATE 100 MG PO CAPS: 100.0000 mg | ORAL_CAPSULE | Freq: Two times a day (BID) | ORAL | 0 refills | Status: DC

## 2022-01-30 NOTE — ED Provider Notes (Signed)
[facility] EMERGENCY DEPARTMENT Provider Note   CSN: [id number] Arrival date & time: 01/30/22  1208     History  Chief Complaint  Patient presents with   Toe Pain    [name] is a 46 y.o. female.  Patient complains of a 2-day history of painful, red area near toenail of the great toe of the left foot.  Patient states that she has had ingrown toenails before.  She denies fever or frank drainage.  Patient has history of chronic pain, anxiety, depression  HPI     Home Medications Prior to Admission medications   Medication Sig Start Date End Date Taking? Authorizing Provider  doxycycline (VIBRAMYCIN) 100 MG capsule Take 1 capsule (100 mg total) by mouth 2 (two) times daily. 01/30/22  Yes Darrick Grinder, PA-C  neomycin-bacitracin-polymyxin (NEOSPORIN) [phone] ointment Apply topically 4 (four) times daily. 01/30/22  Yes Darrick Grinder, PA-C  acetaminophen (TYLENOL) 500 MG tablet Take 500 mg by mouth every 6 (six) hours as needed for mild pain, moderate pain or fever.    [provider]  albuterol (PROVENTIL HFA;VENTOLIN HFA) 108 (90 BASE) MCG/ACT inhaler Inhale 2 puffs into the lungs every 6 (six) hours as needed for wheezing or shortness of breath.    [provider]  clonazePAM (KLONOPIN) 1 MG tablet Take 1 mg by mouth daily.    [provider]  doxycycline (VIBRAMYCIN) 100 MG capsule Take 1 capsule (100 mg total) by mouth 2 (two) times daily. 01/05/19   Ivery Quale, PA-C  metroNIDAZOLE (FLAGYL) 500 MG tablet Take 1 tablet (500 mg total) by mouth 2 (two) times daily. 01/05/19   Ivery Quale, PA-C  Oxycodone HCl 10 MG TABS Take 20 mg by mouth 5 (five) times daily.     [provider]  tiZANidine (ZANAFLEX) 4 MG tablet Take 4 mg by mouth 2 (two) times daily.    [provider]  traMADol (ULTRAM) 50 MG tablet Take 1 tablet (50 mg total) by mouth every 6 (six) hours as needed. 01/05/19   Ivery Quale, PA-C      Allergies     Darvocet [propoxyphene n-acetaminophen] and Penicillins    Review of Systems   Review of Systems  Skin:        Erythema and minimal swelling around toenail of great toe on left foot.     Physical Exam Updated Vital Signs BP 119/84 (BP Location: Right Arm)   Pulse (!) 117   Temp 99 F (37.2 C) (Oral)   Resp 20   Ht 5' 7.5" (1.715 m)   Wt 90.3 kg   LMP 09/27/2013   SpO2 100%   BMI 30.71 kg/m  Physical Exam Vitals and nursing note reviewed.  Constitutional:      General: She is not in acute distress. Eyes:     Conjunctiva/sclera: Conjunctivae normal.  Cardiovascular:     Rate and Rhythm: Normal rate.  Pulmonary:     Effort: Pulmonary effort is normal.  Musculoskeletal:     Cervical back: Normal range of motion.  Skin:    Capillary Refill: Capillary refill takes less than 2 seconds.     Comments: Erythema and minimal swelling around toenail of great toe on left foot.  No abscess palpated  Neurological:     Mental Status: She is alert.    ED Results / Procedures / Treatments   Labs (all labs ordered are listed, but only abnormal results are displayed) Labs Reviewed - No data to display  EKG None  Radiology No results found.  Procedures Procedures    Medications Ordered in ED Medications  ketorolac (TORADOL) tablet 10 mg (10 mg Oral Given 01/30/22 1405)    ED Course/ Medical Decision Making/ A&P                           Medical Decision Making  The patient has a paronychia with no frank abscess.  I do not believe this to be amenable to incision and drainage at this time.  I will discharge the patient home with topical antibiotics with warm foot soaks and oral antibiotics.  She should follow-up with primary care as needed if this does not improve over the next week.  Final Clinical Impression(s) / ED Diagnoses Final diagnoses:  Paronychia of great toe of left foot    Rx / DC Orders ED Discharge Orders          Ordered    doxycycline (VIBRAMYCIN)  100 MG capsule  2 times daily        01/30/22 1419    neomycin-bacitracin-polymyxin (NEOSPORIN) [phone] ointment  4 times daily        01/30/22 1419              Pamala Duffel 01/30/22 1419    Terald Sleeper, MD 01/30/22 [phone]

## 2022-01-30 NOTE — Discharge Instructions (Addendum)
You were diagnosed today with a nail infection of the great toe of the left foot.  No abscess was palpated.  I believe this infection is amenable to treatment with oral and topical antibiotics and foot soaks multiple times per day.  You should soak your foot in warm water with Epsom salt for a minimum of 10 to 15 minutes 4 times per day.  Apply the topical antibiotic ointment that was prescribed after each soak.  You will also be taken the antibiotic tablet twice daily for the next 10 days.  If this fails to improve I recommend seeing primary care for follow-up

## 2022-01-30 NOTE — ED Triage Notes (Signed)
Pt has infected ingrown toe nail, left foot, greater toe.

## 2022-02-14 DIAGNOSIS — Z6832 Body mass index (BMI) 32.0-32.9, adult: Secondary | ICD-10-CM | POA: Diagnosis not present

## 2022-02-14 DIAGNOSIS — M545 Low back pain, unspecified: Secondary | ICD-10-CM | POA: Diagnosis not present

## 2022-02-14 DIAGNOSIS — G8929 Other chronic pain: Secondary | ICD-10-CM | POA: Diagnosis not present

## 2022-02-14 DIAGNOSIS — Z79899 Other long term (current) drug therapy: Secondary | ICD-10-CM | POA: Diagnosis not present

## 2022-02-14 DIAGNOSIS — F332 Major depressive disorder, recurrent severe without psychotic features: Secondary | ICD-10-CM | POA: Diagnosis not present

## 2022-02-14 DIAGNOSIS — M542 Cervicalgia: Secondary | ICD-10-CM | POA: Diagnosis not present

## 2022-02-18 DIAGNOSIS — Z79899 Other long term (current) drug therapy: Secondary | ICD-10-CM | POA: Diagnosis not present

## 2022-03-29 DIAGNOSIS — G8929 Other chronic pain: Secondary | ICD-10-CM | POA: Diagnosis not present

## 2022-03-29 DIAGNOSIS — M542 Cervicalgia: Secondary | ICD-10-CM | POA: Diagnosis not present

## 2022-03-29 DIAGNOSIS — Z79899 Other long term (current) drug therapy: Secondary | ICD-10-CM | POA: Diagnosis not present

## 2022-03-29 DIAGNOSIS — F419 Anxiety disorder, unspecified: Secondary | ICD-10-CM | POA: Diagnosis not present

## 2022-03-29 DIAGNOSIS — F332 Major depressive disorder, recurrent severe without psychotic features: Secondary | ICD-10-CM | POA: Diagnosis not present

## 2022-03-29 DIAGNOSIS — R202 Paresthesia of skin: Secondary | ICD-10-CM | POA: Diagnosis not present

## 2022-03-29 DIAGNOSIS — Z6832 Body mass index (BMI) 32.0-32.9, adult: Secondary | ICD-10-CM | POA: Diagnosis not present

## 2022-03-29 DIAGNOSIS — M25512 Pain in left shoulder: Secondary | ICD-10-CM | POA: Diagnosis not present

## 2022-03-29 DIAGNOSIS — M545 Low back pain, unspecified: Secondary | ICD-10-CM | POA: Diagnosis not present

## 2022-04-28 DIAGNOSIS — R202 Paresthesia of skin: Secondary | ICD-10-CM | POA: Diagnosis not present

## 2022-04-28 DIAGNOSIS — M545 Low back pain, unspecified: Secondary | ICD-10-CM | POA: Diagnosis not present

## 2022-04-28 DIAGNOSIS — F332 Major depressive disorder, recurrent severe without psychotic features: Secondary | ICD-10-CM | POA: Diagnosis not present

## 2022-04-28 DIAGNOSIS — Z79899 Other long term (current) drug therapy: Secondary | ICD-10-CM | POA: Diagnosis not present

## 2022-04-28 DIAGNOSIS — M542 Cervicalgia: Secondary | ICD-10-CM | POA: Diagnosis not present

## 2022-04-28 DIAGNOSIS — Z6831 Body mass index (BMI) 31.0-31.9, adult: Secondary | ICD-10-CM | POA: Diagnosis not present

## 2022-04-28 DIAGNOSIS — M25512 Pain in left shoulder: Secondary | ICD-10-CM | POA: Diagnosis not present

## 2022-04-28 DIAGNOSIS — G8929 Other chronic pain: Secondary | ICD-10-CM | POA: Diagnosis not present

## 2022-04-28 DIAGNOSIS — F419 Anxiety disorder, unspecified: Secondary | ICD-10-CM | POA: Diagnosis not present

## 2022-05-27 DIAGNOSIS — F332 Major depressive disorder, recurrent severe without psychotic features: Secondary | ICD-10-CM | POA: Diagnosis not present

## 2022-05-27 DIAGNOSIS — G894 Chronic pain syndrome: Secondary | ICD-10-CM | POA: Diagnosis not present

## 2022-05-27 DIAGNOSIS — M545 Low back pain, unspecified: Secondary | ICD-10-CM | POA: Diagnosis not present

## 2022-05-27 DIAGNOSIS — M542 Cervicalgia: Secondary | ICD-10-CM | POA: Diagnosis not present

## 2022-05-27 DIAGNOSIS — F419 Anxiety disorder, unspecified: Secondary | ICD-10-CM | POA: Diagnosis not present

## 2022-05-27 DIAGNOSIS — G8929 Other chronic pain: Secondary | ICD-10-CM | POA: Diagnosis not present

## 2022-05-27 DIAGNOSIS — M25512 Pain in left shoulder: Secondary | ICD-10-CM | POA: Diagnosis not present

## 2022-05-27 DIAGNOSIS — R202 Paresthesia of skin: Secondary | ICD-10-CM | POA: Diagnosis not present

## 2022-06-07 NOTE — Progress Notes (Signed)
No chief complaint on file.

## 2022-06-30 DIAGNOSIS — G8929 Other chronic pain: Secondary | ICD-10-CM | POA: Diagnosis not present

## 2022-06-30 DIAGNOSIS — M542 Cervicalgia: Secondary | ICD-10-CM | POA: Diagnosis not present

## 2022-06-30 DIAGNOSIS — M545 Low back pain, unspecified: Secondary | ICD-10-CM | POA: Diagnosis not present

## 2022-06-30 DIAGNOSIS — G894 Chronic pain syndrome: Secondary | ICD-10-CM | POA: Diagnosis not present

## 2022-06-30 DIAGNOSIS — F17210 Nicotine dependence, cigarettes, uncomplicated: Secondary | ICD-10-CM | POA: Diagnosis not present

## 2022-06-30 DIAGNOSIS — R202 Paresthesia of skin: Secondary | ICD-10-CM | POA: Diagnosis not present

## 2022-06-30 DIAGNOSIS — M25512 Pain in left shoulder: Secondary | ICD-10-CM | POA: Diagnosis not present

## 2022-06-30 DIAGNOSIS — F419 Anxiety disorder, unspecified: Secondary | ICD-10-CM | POA: Diagnosis not present

## 2022-06-30 DIAGNOSIS — F332 Major depressive disorder, recurrent severe without psychotic features: Secondary | ICD-10-CM | POA: Diagnosis not present

## 2022-08-25 DIAGNOSIS — F17210 Nicotine dependence, cigarettes, uncomplicated: Secondary | ICD-10-CM | POA: Diagnosis not present

## 2022-08-25 DIAGNOSIS — M25512 Pain in left shoulder: Secondary | ICD-10-CM | POA: Diagnosis not present

## 2022-08-25 DIAGNOSIS — G894 Chronic pain syndrome: Secondary | ICD-10-CM | POA: Diagnosis not present

## 2022-08-25 DIAGNOSIS — R202 Paresthesia of skin: Secondary | ICD-10-CM | POA: Diagnosis not present

## 2022-08-25 DIAGNOSIS — G8929 Other chronic pain: Secondary | ICD-10-CM | POA: Diagnosis not present

## 2022-08-25 DIAGNOSIS — F419 Anxiety disorder, unspecified: Secondary | ICD-10-CM | POA: Diagnosis not present

## 2022-08-25 DIAGNOSIS — M545 Low back pain, unspecified: Secondary | ICD-10-CM | POA: Diagnosis not present

## 2022-08-25 DIAGNOSIS — F32A Depression, unspecified: Secondary | ICD-10-CM | POA: Diagnosis not present

## 2022-08-25 DIAGNOSIS — M542 Cervicalgia: Secondary | ICD-10-CM | POA: Diagnosis not present

## 2022-09-25 DIAGNOSIS — M545 Low back pain, unspecified: Secondary | ICD-10-CM | POA: Diagnosis not present

## 2022-09-25 DIAGNOSIS — M542 Cervicalgia: Secondary | ICD-10-CM | POA: Diagnosis not present

## 2022-09-25 DIAGNOSIS — G8929 Other chronic pain: Secondary | ICD-10-CM | POA: Diagnosis not present

## 2022-09-25 DIAGNOSIS — F32A Depression, unspecified: Secondary | ICD-10-CM | POA: Diagnosis not present

## 2022-09-25 DIAGNOSIS — R202 Paresthesia of skin: Secondary | ICD-10-CM | POA: Diagnosis not present

## 2022-09-25 DIAGNOSIS — M25512 Pain in left shoulder: Secondary | ICD-10-CM | POA: Diagnosis not present

## 2022-09-25 DIAGNOSIS — F419 Anxiety disorder, unspecified: Secondary | ICD-10-CM | POA: Diagnosis not present

## 2022-09-25 DIAGNOSIS — F17210 Nicotine dependence, cigarettes, uncomplicated: Secondary | ICD-10-CM | POA: Diagnosis not present

## 2022-10-02 DIAGNOSIS — R110 Nausea: Secondary | ICD-10-CM | POA: Diagnosis not present

## 2022-10-02 DIAGNOSIS — R12 Heartburn: Secondary | ICD-10-CM | POA: Diagnosis not present

## 2022-10-02 DIAGNOSIS — L304 Erythema intertrigo: Secondary | ICD-10-CM | POA: Diagnosis not present

## 2022-10-27 DIAGNOSIS — M545 Low back pain, unspecified: Secondary | ICD-10-CM | POA: Diagnosis not present

## 2022-10-27 DIAGNOSIS — R202 Paresthesia of skin: Secondary | ICD-10-CM | POA: Diagnosis not present

## 2022-10-27 DIAGNOSIS — M542 Cervicalgia: Secondary | ICD-10-CM | POA: Diagnosis not present

## 2022-10-27 DIAGNOSIS — F17210 Nicotine dependence, cigarettes, uncomplicated: Secondary | ICD-10-CM | POA: Diagnosis not present

## 2022-10-27 DIAGNOSIS — F32A Depression, unspecified: Secondary | ICD-10-CM | POA: Diagnosis not present

## 2022-10-27 DIAGNOSIS — M25512 Pain in left shoulder: Secondary | ICD-10-CM | POA: Diagnosis not present

## 2022-10-27 DIAGNOSIS — F419 Anxiety disorder, unspecified: Secondary | ICD-10-CM | POA: Diagnosis not present

## 2022-10-27 DIAGNOSIS — G894 Chronic pain syndrome: Secondary | ICD-10-CM | POA: Diagnosis not present

## 2022-10-27 DIAGNOSIS — Z79899 Other long term (current) drug therapy: Secondary | ICD-10-CM | POA: Diagnosis not present

## 2022-10-27 DIAGNOSIS — G8929 Other chronic pain: Secondary | ICD-10-CM | POA: Diagnosis not present

## 2022-11-25 DIAGNOSIS — M25512 Pain in left shoulder: Secondary | ICD-10-CM | POA: Diagnosis not present

## 2022-11-25 DIAGNOSIS — Z79899 Other long term (current) drug therapy: Secondary | ICD-10-CM | POA: Diagnosis not present

## 2022-11-25 DIAGNOSIS — G894 Chronic pain syndrome: Secondary | ICD-10-CM | POA: Diagnosis not present

## 2022-11-25 DIAGNOSIS — F419 Anxiety disorder, unspecified: Secondary | ICD-10-CM | POA: Diagnosis not present

## 2022-11-25 DIAGNOSIS — F32A Depression, unspecified: Secondary | ICD-10-CM | POA: Diagnosis not present

## 2022-11-25 DIAGNOSIS — M545 Low back pain, unspecified: Secondary | ICD-10-CM | POA: Diagnosis not present

## 2022-11-25 DIAGNOSIS — M542 Cervicalgia: Secondary | ICD-10-CM | POA: Diagnosis not present

## 2022-11-25 DIAGNOSIS — F17210 Nicotine dependence, cigarettes, uncomplicated: Secondary | ICD-10-CM | POA: Diagnosis not present

## 2022-11-25 DIAGNOSIS — G8929 Other chronic pain: Secondary | ICD-10-CM | POA: Diagnosis not present

## 2022-11-27 DIAGNOSIS — Z79899 Other long term (current) drug therapy: Secondary | ICD-10-CM | POA: Diagnosis not present

## 2022-12-25 DIAGNOSIS — M25512 Pain in left shoulder: Secondary | ICD-10-CM | POA: Diagnosis not present

## 2022-12-25 DIAGNOSIS — G894 Chronic pain syndrome: Secondary | ICD-10-CM | POA: Diagnosis not present

## 2022-12-25 DIAGNOSIS — F32A Depression, unspecified: Secondary | ICD-10-CM | POA: Diagnosis not present

## 2022-12-25 DIAGNOSIS — F17210 Nicotine dependence, cigarettes, uncomplicated: Secondary | ICD-10-CM | POA: Diagnosis not present

## 2022-12-25 DIAGNOSIS — M542 Cervicalgia: Secondary | ICD-10-CM | POA: Diagnosis not present

## 2022-12-25 DIAGNOSIS — F419 Anxiety disorder, unspecified: Secondary | ICD-10-CM | POA: Diagnosis not present

## 2022-12-25 DIAGNOSIS — M545 Low back pain, unspecified: Secondary | ICD-10-CM | POA: Diagnosis not present

## 2022-12-25 DIAGNOSIS — Z79899 Other long term (current) drug therapy: Secondary | ICD-10-CM | POA: Diagnosis not present

## 2023-01-24 DIAGNOSIS — G894 Chronic pain syndrome: Secondary | ICD-10-CM | POA: Diagnosis not present

## 2023-01-24 DIAGNOSIS — M542 Cervicalgia: Secondary | ICD-10-CM | POA: Diagnosis not present

## 2023-01-24 DIAGNOSIS — F17210 Nicotine dependence, cigarettes, uncomplicated: Secondary | ICD-10-CM | POA: Diagnosis not present

## 2023-01-24 DIAGNOSIS — F419 Anxiety disorder, unspecified: Secondary | ICD-10-CM | POA: Diagnosis not present

## 2023-01-24 DIAGNOSIS — F32A Depression, unspecified: Secondary | ICD-10-CM | POA: Diagnosis not present

## 2023-01-24 DIAGNOSIS — M545 Low back pain, unspecified: Secondary | ICD-10-CM | POA: Diagnosis not present

## 2023-01-24 DIAGNOSIS — M25512 Pain in left shoulder: Secondary | ICD-10-CM | POA: Diagnosis not present

## 2023-02-24 DIAGNOSIS — F419 Anxiety disorder, unspecified: Secondary | ICD-10-CM | POA: Diagnosis not present

## 2023-02-24 DIAGNOSIS — M545 Low back pain, unspecified: Secondary | ICD-10-CM | POA: Diagnosis not present

## 2023-02-24 DIAGNOSIS — M542 Cervicalgia: Secondary | ICD-10-CM | POA: Diagnosis not present

## 2023-02-24 DIAGNOSIS — F32A Depression, unspecified: Secondary | ICD-10-CM | POA: Diagnosis not present

## 2023-02-24 DIAGNOSIS — G894 Chronic pain syndrome: Secondary | ICD-10-CM | POA: Diagnosis not present

## 2023-02-24 DIAGNOSIS — M25512 Pain in left shoulder: Secondary | ICD-10-CM | POA: Diagnosis not present

## 2023-02-24 DIAGNOSIS — F17210 Nicotine dependence, cigarettes, uncomplicated: Secondary | ICD-10-CM | POA: Diagnosis not present

## 2023-03-25 DIAGNOSIS — M542 Cervicalgia: Secondary | ICD-10-CM | POA: Diagnosis not present

## 2023-03-25 DIAGNOSIS — M545 Low back pain, unspecified: Secondary | ICD-10-CM | POA: Diagnosis not present

## 2023-03-25 DIAGNOSIS — M25512 Pain in left shoulder: Secondary | ICD-10-CM | POA: Diagnosis not present

## 2023-03-25 DIAGNOSIS — G894 Chronic pain syndrome: Secondary | ICD-10-CM | POA: Diagnosis not present

## 2023-03-25 DIAGNOSIS — F32A Depression, unspecified: Secondary | ICD-10-CM | POA: Diagnosis not present

## 2023-03-25 DIAGNOSIS — F17210 Nicotine dependence, cigarettes, uncomplicated: Secondary | ICD-10-CM | POA: Diagnosis not present

## 2023-03-25 DIAGNOSIS — F419 Anxiety disorder, unspecified: Secondary | ICD-10-CM | POA: Diagnosis not present

## 2023-04-28 DIAGNOSIS — M542 Cervicalgia: Secondary | ICD-10-CM | POA: Diagnosis not present

## 2023-04-28 DIAGNOSIS — F17210 Nicotine dependence, cigarettes, uncomplicated: Secondary | ICD-10-CM | POA: Diagnosis not present

## 2023-04-28 DIAGNOSIS — M545 Low back pain, unspecified: Secondary | ICD-10-CM | POA: Diagnosis not present

## 2023-04-28 DIAGNOSIS — F32A Depression, unspecified: Secondary | ICD-10-CM | POA: Diagnosis not present

## 2023-04-28 DIAGNOSIS — F419 Anxiety disorder, unspecified: Secondary | ICD-10-CM | POA: Diagnosis not present

## 2023-04-28 DIAGNOSIS — G894 Chronic pain syndrome: Secondary | ICD-10-CM | POA: Diagnosis not present

## 2023-04-28 DIAGNOSIS — M25512 Pain in left shoulder: Secondary | ICD-10-CM | POA: Diagnosis not present

## 2023-05-28 DIAGNOSIS — M25512 Pain in left shoulder: Secondary | ICD-10-CM | POA: Diagnosis not present

## 2023-05-28 DIAGNOSIS — F32A Depression, unspecified: Secondary | ICD-10-CM | POA: Diagnosis not present

## 2023-05-28 DIAGNOSIS — F419 Anxiety disorder, unspecified: Secondary | ICD-10-CM | POA: Diagnosis not present

## 2023-05-28 DIAGNOSIS — G894 Chronic pain syndrome: Secondary | ICD-10-CM | POA: Diagnosis not present

## 2023-05-28 DIAGNOSIS — M545 Low back pain, unspecified: Secondary | ICD-10-CM | POA: Diagnosis not present

## 2023-05-28 DIAGNOSIS — M542 Cervicalgia: Secondary | ICD-10-CM | POA: Diagnosis not present

## 2023-05-28 DIAGNOSIS — Z79899 Other long term (current) drug therapy: Secondary | ICD-10-CM | POA: Diagnosis not present

## 2023-05-28 DIAGNOSIS — F17210 Nicotine dependence, cigarettes, uncomplicated: Secondary | ICD-10-CM | POA: Diagnosis not present

## 2023-06-30 DIAGNOSIS — Z79899 Other long term (current) drug therapy: Secondary | ICD-10-CM | POA: Diagnosis not present

## 2023-06-30 DIAGNOSIS — G894 Chronic pain syndrome: Secondary | ICD-10-CM | POA: Diagnosis not present

## 2023-06-30 DIAGNOSIS — F32A Depression, unspecified: Secondary | ICD-10-CM | POA: Diagnosis not present

## 2023-06-30 DIAGNOSIS — M25512 Pain in left shoulder: Secondary | ICD-10-CM | POA: Diagnosis not present

## 2023-06-30 DIAGNOSIS — F17210 Nicotine dependence, cigarettes, uncomplicated: Secondary | ICD-10-CM | POA: Diagnosis not present

## 2023-06-30 DIAGNOSIS — F419 Anxiety disorder, unspecified: Secondary | ICD-10-CM | POA: Diagnosis not present

## 2023-06-30 DIAGNOSIS — M542 Cervicalgia: Secondary | ICD-10-CM | POA: Diagnosis not present

## 2023-06-30 DIAGNOSIS — M545 Low back pain, unspecified: Secondary | ICD-10-CM | POA: Diagnosis not present

## 2023-07-29 DIAGNOSIS — M25512 Pain in left shoulder: Secondary | ICD-10-CM | POA: Diagnosis not present

## 2023-07-29 DIAGNOSIS — G894 Chronic pain syndrome: Secondary | ICD-10-CM | POA: Diagnosis not present

## 2023-07-29 DIAGNOSIS — M542 Cervicalgia: Secondary | ICD-10-CM | POA: Diagnosis not present

## 2023-07-29 DIAGNOSIS — T07XXXA Unspecified multiple injuries, initial encounter: Secondary | ICD-10-CM | POA: Diagnosis not present

## 2023-07-29 DIAGNOSIS — F17210 Nicotine dependence, cigarettes, uncomplicated: Secondary | ICD-10-CM | POA: Diagnosis not present

## 2023-07-29 DIAGNOSIS — F419 Anxiety disorder, unspecified: Secondary | ICD-10-CM | POA: Diagnosis not present

## 2023-07-29 DIAGNOSIS — M545 Low back pain, unspecified: Secondary | ICD-10-CM | POA: Diagnosis not present

## 2023-07-29 DIAGNOSIS — F32A Depression, unspecified: Secondary | ICD-10-CM | POA: Diagnosis not present

## 2023-08-27 DIAGNOSIS — M25512 Pain in left shoulder: Secondary | ICD-10-CM | POA: Diagnosis not present

## 2023-08-27 DIAGNOSIS — M545 Low back pain, unspecified: Secondary | ICD-10-CM | POA: Diagnosis not present

## 2023-08-27 DIAGNOSIS — F17210 Nicotine dependence, cigarettes, uncomplicated: Secondary | ICD-10-CM | POA: Diagnosis not present

## 2023-08-27 DIAGNOSIS — F419 Anxiety disorder, unspecified: Secondary | ICD-10-CM | POA: Diagnosis not present

## 2023-08-27 DIAGNOSIS — F32A Depression, unspecified: Secondary | ICD-10-CM | POA: Diagnosis not present

## 2023-08-27 DIAGNOSIS — G894 Chronic pain syndrome: Secondary | ICD-10-CM | POA: Diagnosis not present

## 2023-08-27 DIAGNOSIS — M542 Cervicalgia: Secondary | ICD-10-CM | POA: Diagnosis not present

## 2023-09-25 DIAGNOSIS — M25512 Pain in left shoulder: Secondary | ICD-10-CM | POA: Diagnosis not present

## 2023-09-25 DIAGNOSIS — F17210 Nicotine dependence, cigarettes, uncomplicated: Secondary | ICD-10-CM | POA: Diagnosis not present

## 2023-09-25 DIAGNOSIS — M542 Cervicalgia: Secondary | ICD-10-CM | POA: Diagnosis not present

## 2023-09-25 DIAGNOSIS — F32A Depression, unspecified: Secondary | ICD-10-CM | POA: Diagnosis not present

## 2023-09-25 DIAGNOSIS — M545 Low back pain, unspecified: Secondary | ICD-10-CM | POA: Diagnosis not present

## 2023-09-25 DIAGNOSIS — F419 Anxiety disorder, unspecified: Secondary | ICD-10-CM | POA: Diagnosis not present

## 2023-09-25 DIAGNOSIS — G894 Chronic pain syndrome: Secondary | ICD-10-CM | POA: Diagnosis not present

## 2023-10-05 ENCOUNTER — Emergency Department (HOSPITAL_COMMUNITY)
Admission: EM | Admit: 2023-10-05 | Discharge: 2023-10-07 | Disposition: A | Discharge status: Home / Self Care | Payer: Medicaid Other | Attending: Emergency Medicine | Admitting: Emergency Medicine

## 2023-10-05 DIAGNOSIS — T424X4A Poisoning by benzodiazepines, undetermined, initial encounter: Secondary | ICD-10-CM | POA: Insufficient documentation

## 2023-10-05 DIAGNOSIS — R404 Transient alteration of awareness: Secondary | ICD-10-CM | POA: Diagnosis not present

## 2023-10-05 DIAGNOSIS — T50904A Poisoning by unspecified drugs, medicaments and biological substances, undetermined, initial encounter: Secondary | ICD-10-CM | POA: Diagnosis not present

## 2023-10-05 DIAGNOSIS — T50901A Poisoning by unspecified drugs, medicaments and biological substances, accidental (unintentional), initial encounter: Secondary | ICD-10-CM | POA: Diagnosis not present

## 2023-10-05 DIAGNOSIS — T887XXA Unspecified adverse effect of drug or medicament, initial encounter: Secondary | ICD-10-CM | POA: Diagnosis not present

## 2023-10-05 DIAGNOSIS — F1120 Opioid dependence, uncomplicated: Secondary | ICD-10-CM | POA: Diagnosis not present

## 2023-10-05 DIAGNOSIS — R9431 Abnormal electrocardiogram [ECG] [EKG]: Secondary | ICD-10-CM | POA: Diagnosis not present

## 2023-10-05 DIAGNOSIS — F1310 Sedative, hypnotic or anxiolytic abuse, uncomplicated: Secondary | ICD-10-CM | POA: Diagnosis not present

## 2023-10-05 NOTE — ED Triage Notes (Addendum)
Pt bib [facility] from home after possible overdose. Per EMS pts friend called after seeing pt drinking liquid ativan that was at their house for a hospice pt, reported that pt was talking to TV, not making any sense. EMS states there was an empty bottle of 30 mg morphine tabs prescribed to pt that was filled on the 18th. Pinpoint pupils noted.

## 2023-10-06 ENCOUNTER — Encounter (HOSPITAL_COMMUNITY): Payer: Self-pay

## 2023-10-06 ENCOUNTER — Other Ambulatory Visit: Payer: Self-pay

## 2023-10-06 DIAGNOSIS — F1310 Sedative, hypnotic or anxiolytic abuse, uncomplicated: Secondary | ICD-10-CM

## 2023-10-06 DIAGNOSIS — F1120 Opioid dependence, uncomplicated: Secondary | ICD-10-CM

## 2023-10-06 LAB — COMPREHENSIVE METABOLIC PANEL
ALT: 14 U/L (ref 0–44)
AST: 22 U/L (ref 15–41)
Albumin: 4 g/dL (ref 3.5–5.0)
Alkaline Phosphatase: 57 U/L (ref 38–126)
Anion gap: 9 (ref 5–15)
BUN: 8 mg/dL (ref 6–20)
CO2: 26 mmol/L (ref 22–32)
Calcium: 9.3 mg/dL (ref 8.9–10.3)
Chloride: 103 mmol/L (ref 98–111)
Creatinine, Ser: 0.66 mg/dL (ref 0.44–1.00)
GFR, Estimated: >60 mL/min (ref >60)
Glucose, Bld: 97 mg/dL (ref 70–99)
Potassium: 3.5 mmol/L (ref 3.5–5.1)
Sodium: 138 mmol/L (ref 135–145)
Total Bilirubin: 0.9 mg/dL (ref 0.0–1.2)
Total Protein: 7.1 g/dL (ref 6.5–8.1)

## 2023-10-06 LAB — URINALYSIS, W/ REFLEX TO CULTURE (INFECTION SUSPECTED)
Bilirubin Urine: NEGATIVE
Glucose, UA: NEGATIVE mg/dL
Ketones, ur: NEGATIVE mg/dL
Leukocytes,Ua: NEGATIVE
Nitrite: NEGATIVE
Protein, ur: 30 mg/dL — AB
Specific Gravity, Urine: 1.015 (ref 1.005–1.030)
pH: 6 (ref 5.0–8.0)

## 2023-10-06 LAB — ACETAMINOPHEN LEVEL: Acetaminophen (Tylenol), Serum: <10 ug/mL — ABNORMAL LOW (ref 10–30)

## 2023-10-06 LAB — TROPONIN I (HIGH SENSITIVITY)
Troponin I (High Sensitivity): 4 ng/L (ref <18)
Troponin I (High Sensitivity): 3 ng/L (ref <18)

## 2023-10-06 LAB — RAPID URINE DRUG SCREEN, HOSP PERFORMED
Amphetamines: NOT DETECTED
Barbiturates: NOT DETECTED
Benzodiazepines: POSITIVE — AB
Cocaine: NOT DETECTED
Opiates: POSITIVE — AB
Tetrahydrocannabinol: NOT DETECTED

## 2023-10-06 LAB — CBC WITH DIFFERENTIAL/PLATELET
Abs Immature Granulocytes: 0.01 10*3/uL (ref 0.00–0.07)
Basophils Absolute: 0 10*3/uL (ref 0.0–0.1)
Basophils Relative: 0 %
Eosinophils Absolute: 0.1 10*3/uL (ref 0.0–0.5)
Eosinophils Relative: 2 %
HCT: 44.1 % (ref 36.0–46.0)
Hemoglobin: 15.2 g/dL — ABNORMAL HIGH (ref 12.0–15.0)
Immature Granulocytes: 0 %
Lymphocytes Relative: 38 %
Lymphs Abs: 1.7 10*3/uL (ref 0.7–4.0)
MCH: 31.1 pg (ref 26.0–34.0)
MCHC: 34.5 g/dL (ref 30.0–36.0)
MCV: 90.4 fL (ref 80.0–100.0)
Monocytes Absolute: 0.3 10*3/uL (ref 0.1–1.0)
Monocytes Relative: 6 %
Neutro Abs: 2.4 10*3/uL (ref 1.7–7.7)
Neutrophils Relative %: 54 %
Platelets: 141 10*3/uL — ABNORMAL LOW (ref 150–400)
RBC: 4.88 MIL/uL (ref 3.87–5.11)
RDW: 12.9 % (ref 11.5–15.5)
WBC: 4.5 10*3/uL (ref 4.0–10.5)
nRBC: 0 % (ref 0.0–0.2)

## 2023-10-06 LAB — ETHANOL: Alcohol, Ethyl (B): <10 mg/dL (ref <10)

## 2023-10-06 LAB — CBG MONITORING, ED: Glucose-Capillary: 112 mg/dL — ABNORMAL HIGH (ref 70–99)

## 2023-10-06 MED ORDER — CLONAZEPAM 0.5 MG PO TABS
1.0000 mg | ORAL_TABLET | Freq: Every day | ORAL | Status: DC
  Administered 2023-10-06: 1 mg via ORAL
  Filled 2023-10-06: qty 2

## 2023-10-06 MED ORDER — ONDANSETRON HCL 4 MG PO TABS: 4.0000 mg | ORAL_TABLET | Freq: Three times a day (TID) | ORAL | Status: DC | PRN

## 2023-10-06 MED ORDER — ALBUTEROL SULFATE HFA 108 (90 BASE) MCG/ACT IN AERS: 2.0000 | INHALATION_SPRAY | Freq: Four times a day (QID) | RESPIRATORY_TRACT | Status: DC | PRN

## 2023-10-06 MED ORDER — ACETAMINOPHEN 325 MG PO TABS
650.0000 mg | ORAL_TABLET | ORAL | Status: DC | PRN
  Administered 2023-10-06: 650 mg via ORAL
  Filled 2023-10-06: qty 2

## 2023-10-06 MED ORDER — NICOTINE 14 MG/24HR TD PT24
14.0000 mg | MEDICATED_PATCH | Freq: Every day | TRANSDERMAL | Status: DC
  Filled 2023-10-06: qty 1

## 2023-10-06 MED ORDER — ALBUTEROL SULFATE (2.5 MG/3ML) 0.083% IN NEBU: 2.5000 mg | INHALATION_SOLUTION | Freq: Four times a day (QID) | RESPIRATORY_TRACT | Status: DC | PRN

## 2023-10-06 MED ORDER — ALUM & MAG HYDROXIDE-SIMETH 200-200-20 MG/5ML PO SUSP: 30.0000 mL | Freq: Four times a day (QID) | ORAL | Status: DC | PRN

## 2023-10-06 NOTE — ED Notes (Addendum)
Patient on the phone with room mate [name].

## 2023-10-06 NOTE — ED Notes (Signed)
Patient offered morning tray, patient declined.

## 2023-10-06 NOTE — ED Provider Notes (Signed)
Emergency Medicine Observation Re-evaluation Note  [name] is a 48 y.o. female, seen on rounds today.  Pt initially presented to the ED for complaints of substance use disorder and recent abuse of substances. No new c/o this AM.   Physical Exam  BP 119/73   Pulse 67   Temp (!) 97.2 F (36.2 C) (Axillary)   Resp 18   Ht 1.702 m (5\' 7" )   Wt 69.3 kg   LMP 09/27/2013   SpO2 94%   BMI 23.92 kg/m  Physical Exam General: resting.  Cardiac: regular rate.  Lungs: breathing comfortably. Psych: calm.  ED Course / MDM    I have reviewed the labs performed to date as well as medications administered while in observation.  Recent changes in the last 24 hours include ED obs, reassessment.   Plan  Pt awaiting dispo/placement from [facility] team.     Cathren Laine, MD 10/06/23 [phone]

## 2023-10-06 NOTE — ED Notes (Signed)
Pt with TTS

## 2023-10-06 NOTE — ED Notes (Signed)
ED Provider at bedside.

## 2023-10-06 NOTE — Consult Note (Signed)
[facility] Consult Note  Patient Name: [name] MRN: [id number] DOB: 22-Aug-1976 DATE OF Consult: 10/06/2023  PRIMARY PSYCHIATRIC DIAGNOSES  1.  Substance abuse 2.   3.    RECOMMENDATIONS  Recommendations: Medication recommendations: none  Non-Medication/therapeutic recommendations: don't take other peoples medications Is inpatient psychiatric hospitalization recommended for this patient? No (Explain why): she does not meet criteria Follow-Up Telepsychiatry C/L services: We will sign off for now. Please re-consult our service if needed for any concerning changes in the patient's condition, discharge planning, or questions. Communication: Treatment team members (and family members if applicable) who were involved in treatment/care discussions and planning, and with whom we spoke or engaged with via secure text/chat, include the following: The ED provider  Thank you for involving [place] in the care of this patient. If you have any additional questions or concerns, please call [phone] and ask for me or the provider on-call.  TELEPSYCHIATRY ATTESTATION & CONSENT  As the provider for this telehealth consult, I attest that I verified the patient's identity using two separate identifiers, introduced myself to the patient, provided my credentials, disclosed my location, and performed this encounter via a HIPAA-compliant, real-time, face-to-face, two-way, interactive audio and video platform and with the full consent and agreement of the patient (or guardian as applicable.)  Patient physical location: ED at [facility] . Telehealth provider physical location: home office in state of [place].  Video start time: 1840 (Central Time) Video end time: 1850 (Central Time)  IDENTIFYING DATA  [name] is a 48 y.o. year-old female for whom a psychiatric consultation has been ordered by the primary provider. The patient was identified using two separate identifiers.  CHIEF  COMPLAINT/REASON FOR CONSULT  Possible overdose of pain meds and ativan  HISTORY OF PRESENT ILLNESS (HPI)  The patient is a 48 year old female with a chart history of depression, anxiety and benzodiazapine and opioid abuse. Per Chart, "EMS reports that a friend had seen the patient drinking some liquid lorazepam that was intended for someone else who is a hospice patient. They also note that there is an empty bottle of 30 mg morphine tablets that had been filled on January 18. Friend noted that patient was talking but not making sense. "   She was confused for the ED staff on arrival. On assessment today she is stating that she does not remember what happened and is denying overtaking her pills or taking any of the liquid ativan. Then she stated that she at most had one swallow of the Ativan. But still denies remembering doing it. She does not see how her statement is illogical.  She denies any psychiatry history or diagnoses but chart states that she has history of anxiety and depression. She denies having any drug problems though the chart states that sh has history of benzo and opiate abuse.  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 She feels like that could have made her confused.   She denies SI/HI/AVH.     PAST PSYCHIATRIC HISTORY   Otherwise as per HPI above.  PAST MEDICAL HISTORY  Past Medical History:  Diagnosis Date   Anxiety    Chronic back pain    Chronic pain    DDD (degenerative disc disease), lumbar    Depression    DJD (degenerative joint disease), lumbar    Kidney stone    Lymph node abscess    s/p removal  Migraine    Neck fracture (HCC)    C1   Ovarian cyst    Followed by GYN Dr. Ralph Dowdy   Pain management    PONV (postoperative nausea and vomiting)    Seizures (HCC)      HOME MEDICATIONS  Facility Ordered Medications  Medication   nicotine (NICODERM CQ - dosed in mg/24 hours) patch 14 mg   alum & mag hydroxide-simeth  (MAALOX/MYLANTA) 200-200-20 MG/5ML suspension 30 mL   acetaminophen (TYLENOL) tablet 650 mg   ondansetron (ZOFRAN) tablet 4 mg   clonazePAM (KLONOPIN) tablet 1 mg   albuterol (PROVENTIL) (2.5 MG/3ML) 0.083% nebulizer solution 2.5 mg   PTA Medications  Medication Sig   Oxycodone HCl 10 MG TABS Take 20 mg by mouth 5 (five) times daily.    tiZANidine (ZANAFLEX) 4 MG tablet Take 4 mg by mouth 2 (two) times daily.   clonazePAM (KLONOPIN) 1 MG tablet Take 1 mg by mouth daily.   morphine (MS CONTIN) 30 MG 12 hr tablet Take 30 mg by mouth every 12 (twelve) hours.   acetaminophen (TYLENOL) 500 MG tablet Take 500 mg by mouth every 6 (six) hours as needed for mild pain, moderate pain or fever. (Patient not taking: Reported on 10/06/2023)   albuterol (PROVENTIL HFA;VENTOLIN HFA) 108 (90 BASE) MCG/ACT inhaler Inhale 2 puffs into the lungs every 6 (six) hours as needed for wheezing or shortness of breath. (Patient not taking: Reported on 10/06/2023)   traMADol (ULTRAM) 50 MG tablet Take 1 tablet (50 mg total) by mouth every 6 (six) hours as needed. (Patient not taking: Reported on 10/06/2023)     ALLERGIES  Allergies  Allergen Reactions   Darvocet [Propoxyphene N-Acetaminophen] Nausea Only   Penicillins     REACTION: Not sure - mom told her she was allergic    SOCIAL & SUBSTANCE USE HISTORY  Social History   Socioeconomic History   Marital status: Divorced    Spouse name: Not on file   Number of children: Not on file   Years of education: Not on file   Highest education level: Not on file  Occupational History   Not on file  Tobacco Use   Smoking status: Every Day    Current packs/day: 0.50    Average packs/day: 0.5 packs/day for 10.0 years (5.0 ttl pk-yrs)    Types: Cigarettes   Smokeless tobacco: Never  Substance and Sexual Activity   Alcohol use: No   Drug use: No   Sexual activity: Yes    Birth control/protection: Surgical  Other Topics Concern   Not on file  Social History  Narrative   Not on file   Social Drivers of Health   Financial Resource Strain: Not on file  Food Insecurity: Not on file  Transportation Needs: Not on file  Physical Activity: Not on file  Stress: Not on file  Social Connections: Not on file   Social History   Tobacco Use  Smoking Status Every Day   Current packs/day: 0.50   Average packs/day: 0.5 packs/day for 10.0 years (5.0 ttl pk-yrs)   Types: Cigarettes  Smokeless Tobacco Never   Social History   Substance and Sexual Activity  Alcohol Use No   Social History   Substance and Sexual Activity  Drug Use No    Additional pertinent information .  FAMILY HISTORY  Family History  Problem Relation Age of Onset   Heart disease Father        MI   Depression Father  Hyperlipidemia Mother    Heart disease Maternal Grandfather    Hyperlipidemia Maternal Grandfather    Cancer Maternal Grandfather        Colon cancer   Family Psychiatric History (if known):  yes multiple diagnoses and successful suicides, and some addictions.  MENTAL STATUS EXAM (MSE)  Mental Status Exam: General Appearance: Disheveled  Orientation:  Full (Time, Place, and Person)  Memory:  Immediate;   Fair Recent;   Poor Remote;   Good  Concentration:  Concentration: Fair and Attention Span: Fair  Recall:  Fair  Attention  Fair  Eye Contact:  Fair  Speech:  Normal Rate  Language:  Fair  Volume:  Normal  Mood: she states that it's fine.   Affect:  Blunt  Thought Process:  Goal Directed  Thought Content:  Illogical  Suicidal Thoughts:  No  Homicidal Thoughts:  No  Judgement:  Poor  Insight:  Fair  Psychomotor Activity:  Normal  Akathisia:  No  Fund of Knowledge:  Fair    Assets:  Housing  Cognition:  WNL  ADL's:  Intact  AIMS (if indicated):       VITALS  Blood pressure 119/77, pulse 76, temperature 98 F (36.7 C), temperature source Oral, resp. rate 18, height 5\' 7"  (1.702 m), weight 69.3 kg, last menstrual period 09/27/2013,  SpO2 98%.  LABS  Admission on 10/05/2023  Component Date Value Ref Range Status   Glucose-Capillary 10/06/2023 112 (H)  70 - 99 mg/dL Final   Glucose reference range applies only to samples taken after fasting for at least 8 hours.   Sodium 10/06/2023 138  135 - 145 mmol/L Final   Potassium 10/06/2023 3.5  3.5 - 5.1 mmol/L Final   Chloride 10/06/2023 103  98 - 111 mmol/L Final   CO2 10/06/2023 26  22 - 32 mmol/L Final   Glucose, Bld 10/06/2023 97  70 - 99 mg/dL Final   Glucose reference range applies only to samples taken after fasting for at least 8 hours.   BUN 10/06/2023 8  6 - 20 mg/dL Final   Creatinine, Ser 10/06/2023 0.66  0.44 - 1.00 mg/dL Final   Calcium 09/81/1914 9.3  8.9 - 10.3 mg/dL Final   Total Protein 78/29/5621 7.1  6.5 - 8.1 g/dL Final   Albumin 30/86/5784 4.0  3.5 - 5.0 g/dL Final   AST 69/62/9528 22  15 - 41 U/L Final   ALT 10/06/2023 14  0 - 44 U/L Final   Alkaline Phosphatase 10/06/2023 57  38 - 126 U/L Final   Total Bilirubin 10/06/2023 0.9  0.0 - 1.2 mg/dL Final   GFR, Estimated 10/06/2023 >60  >60 mL/min Final   Comment: (NOTE) Calculated using the CKD-EPI Creatinine Equation (2021)    Anion gap 10/06/2023 9  5 - 15 Final   Performed at [facility], [address] [place], [place] [address]   Alcohol, Ethyl (B) 10/06/2023 <10  <10 mg/dL Final   Comment: (NOTE) Lowest detectable limit for serum alcohol is 10 mg/dL.  For medical purposes only. Performed at [facility], [address] [place], [place] [address]    Troponin I (High Sensitivity) 10/06/2023 4  <18 ng/L Final   Comment: (NOTE) Elevated high sensitivity troponin I (hsTnI) values and significant  changes across serial measurements may suggest ACS but many other  chronic and acute conditions are known to elevate hsTnI results.  Refer to the "Links" section for chest pain algorithms and additional  guidance. Performed at [facility], [address], [place], [place] [address]     Acetaminophen (Tylenol), Serum 10/06/2023 <10 (L)  10 - 30 ug/mL Final   Comment: (NOTE) Therapeutic concentrations vary significantly. A range of 10-30 ug/mL  may be an effective concentration for many patients. However, some  are best treated at concentrations outside of this range. Acetaminophen concentrations >150 ug/mL at 4 hours after ingestion  and >50 ug/mL at 12 hours after ingestion are often associated with  toxic reactions.  Performed at [facility], [address], [place], [place] [address]    WBC 10/06/2023 4.5  4.0 - 10.5 K/uL Final   RBC 10/06/2023 4.88  3.87 - 5.11 MIL/uL Final   Hemoglobin 10/06/2023 15.2 (H)  12.0 - 15.0 g/dL Final   HCT 09/81/1914 44.1  36.0 - 46.0 % Final   MCV 10/06/2023 90.4  80.0 - 100.0 fL Final   MCH 10/06/2023 31.1  26.0 - 34.0 pg Final   MCHC 10/06/2023 34.5  30.0 - 36.0 g/dL Final   RDW 78/29/5621 12.9  11.5 - 15.5 % Final   Platelets 10/06/2023 141 (L)  150 - 400 K/uL Final   nRBC 10/06/2023 0.0  0.0 - 0.2 % Final   Neutrophils Relative % 10/06/2023 54  % Final   Neutro Abs 10/06/2023 2.4  1.7 - 7.7 K/uL Final   Lymphocytes Relative 10/06/2023 38  % Final   Lymphs Abs 10/06/2023 1.7  0.7 - 4.0 K/uL Final   Monocytes Relative 10/06/2023 6  % Final   Monocytes Absolute 10/06/2023 0.3  0.1 - 1.0 K/uL Final   Eosinophils Relative 10/06/2023 2  % Final   Eosinophils Absolute 10/06/2023 0.1  0.0 - 0.5 K/uL Final   Basophils Relative 10/06/2023 0  % Final   Basophils Absolute 10/06/2023 0.0  0.0 - 0.1 K/uL Final   Immature Granulocytes 10/06/2023 0  % Final   Abs Immature Granulocytes 10/06/2023 0.01  0.00 - 0.07 K/uL Final   Performed at [facility], [address] [place], [place] [address]   Specimen Source 10/06/2023 URINE, CATHETERIZED   Final   Color, Urine 10/06/2023 YELLOW  YELLOW Final   APPearance 10/06/2023 CLEAR  CLEAR Final   Specific Gravity, Urine 10/06/2023 1.015  1.005 - 1.030 Final   pH 10/06/2023 6.0  5.0 - 8.0 Final    Glucose, UA 10/06/2023 NEGATIVE  NEGATIVE mg/dL Final   Hgb urine dipstick 10/06/2023 SMALL (A)  NEGATIVE Final   Bilirubin Urine 10/06/2023 NEGATIVE  NEGATIVE Final   Ketones, ur 10/06/2023 NEGATIVE  NEGATIVE mg/dL Final   Protein, ur 78/46/9629 30 (A)  NEGATIVE mg/dL Final   Nitrite 52/84/1324 NEGATIVE  NEGATIVE Final   Leukocytes,Ua 10/06/2023 NEGATIVE  NEGATIVE Final   RBC / HPF 10/06/2023 6-10  0 - 5 RBC/hpf Final   WBC, UA 10/06/2023 0-5  0 - 5 WBC/hpf Final   Comment:        Reflex urine culture not performed if WBC <=10, OR if Squamous epithelial cells >5. If Squamous epithelial cells >5 suggest recollection.    Bacteria, UA 10/06/2023 RARE (A)  NONE SEEN Final   Squamous Epithelial / HPF 10/06/2023 0-5  0 - 5 /HPF Final   Mucus 10/06/2023 PRESENT   Final   Performed at [facility], [address] [place], [place] [address]   Opiates 10/06/2023 POSITIVE (A)  NONE DETECTED Final   Cocaine 10/06/2023 NONE DETECTED  NONE DETECTED Final   Benzodiazepines 10/06/2023 POSITIVE (A)  NONE DETECTED Final  Amphetamines 10/06/2023 NONE DETECTED  NONE DETECTED Final   Tetrahydrocannabinol 10/06/2023 NONE DETECTED  NONE DETECTED Final   Barbiturates 10/06/2023 NONE DETECTED  NONE DETECTED Final   Comment: (NOTE) DRUG SCREEN FOR MEDICAL PURPOSES ONLY.  IF CONFIRMATION IS NEEDED FOR ANY PURPOSE, NOTIFY LAB WITHIN 5 DAYS.  LOWEST DETECTABLE LIMITS FOR URINE DRUG SCREEN Drug Class                     Cutoff (ng/mL) Amphetamine and metabolites    1000 Barbiturate and metabolites    200 Benzodiazepine                 200 Opiates and metabolites        300 Cocaine and metabolites        300 THC                            50 Performed at [facility], [address], [place], [place] [address]    Troponin I (High Sensitivity) 10/06/2023 3  <18 ng/L Final   Comment: (NOTE) Elevated high sensitivity troponin I (hsTnI) values and significant  changes across serial measurements may  suggest ACS but many other  chronic and acute conditions are known to elevate hsTnI results.  Refer to the "Links" section for chest pain algorithms and additional  guidance. Performed at [facility], [address] [place], [place] [address]     PSYCHIATRIC REVIEW OF SYSTEMS (ROS)  ROS: Notable for the following relevant positive findings: ROS  Additional findings:      Musculoskeletal: No abnormal movements observed      Gait & Station: Normal      Pain Screening: Present - mild to moderate      Nutrition & Dental Concerns: no concerns   RISK FORMULATION/ASSESSMENT  Is the patient experiencing any suicidal or homicidal ideations: No   Protective factors considered for safety management: not si, future oriented  Risk factors/concerns considered for safety management:  Substance abuse/dependence  Is there a safety management plan with the patient and treatment team to minimize risk factors and promote protective factors: Yes           Explain: pt was monitored in the ED Is crisis care placement or psychiatric hospitalization recommended: No     Based on my current evaluation and risk assessment, patient is determined at this time to be at:  Low risk  *RISK ASSESSMENT Risk assessment is a dynamic process; it is possible that this patient's condition, and risk level, may change. This should be re-evaluated and managed over time as appropriate. Please re-consult psychiatric consult services if additional assistance is needed in terms of risk assessment and management. If your team decides to discharge this patient, please advise the patient how to best access emergency psychiatric services, or to call 911, if their condition worsens or they feel unsafe in any way.   Koren Shiver, NP Telepsychiatry Consult Services

## 2023-10-06 NOTE — BH Assessment (Signed)
TTS reached out to RN via secure chat to inquire if pt is medically cleared. TTS is awaiting a response. Pt will be assessed once pt is medically cleared.

## 2023-10-06 NOTE — ED Notes (Signed)
Pt restless and confused. This RN tried to redirect pt with no success.

## 2023-10-06 NOTE — ED Provider Notes (Signed)
[place] EMERGENCY DEPARTMENT AT [facility] Provider Note   CSN: [id number] Arrival date & time: 10/05/23  2352     History  Chief Complaint  Patient presents with   Drug Overdose    [name] is a 48 y.o. female.  The history is provided by the EMS personnel. The history is limited by the condition of the patient (Altered mental status).  Drug Overdose  She has history of substance abuse, depression and was brought in by ambulance after a possible overdose.  EMS reports that a friend had seen the patient drinking some liquid lorazepam that was intended for someone else who is a hospice patient.  They also note that there is an empty bottle of 30 mg morphine tablets that had been filled on January 18.  Friend noted that patient was talking but not making sense.   Home Medications Prior to Admission medications   Medication Sig Start Date End Date Taking? Authorizing Provider  acetaminophen (TYLENOL) 500 MG tablet Take 500 mg by mouth every 6 (six) hours as needed for mild pain, moderate pain or fever.    [provider]  albuterol (PROVENTIL HFA;VENTOLIN HFA) 108 (90 BASE) MCG/ACT inhaler Inhale 2 puffs into the lungs every 6 (six) hours as needed for wheezing or shortness of breath.    [provider]  clonazePAM (KLONOPIN) 1 MG tablet Take 1 mg by mouth daily.    [provider]  doxycycline (VIBRAMYCIN) 100 MG capsule Take 1 capsule (100 mg total) by mouth 2 (two) times daily. 01/05/19   Ivery Quale, PA-C  doxycycline (VIBRAMYCIN) 100 MG capsule Take 1 capsule (100 mg total) by mouth 2 (two) times daily. 01/30/22   Darrick Grinder, PA-C  metroNIDAZOLE (FLAGYL) 500 MG tablet Take 1 tablet (500 mg total) by mouth 2 (two) times daily. 01/05/19   Ivery Quale, PA-C  neomycin-bacitracin-polymyxin (NEOSPORIN) [phone] ointment Apply topically 4 (four) times daily. 01/30/22   Darrick Grinder, PA-C  Oxycodone HCl 10 MG TABS Take 20 mg by  mouth 5 (five) times daily.     [provider]  tiZANidine (ZANAFLEX) 4 MG tablet Take 4 mg by mouth 2 (two) times daily.    [provider]  traMADol (ULTRAM) 50 MG tablet Take 1 tablet (50 mg total) by mouth every 6 (six) hours as needed. 01/05/19   Ivery Quale, PA-C      Allergies    Darvocet [propoxyphene n-acetaminophen] and Penicillins    Review of Systems   Review of Systems  Unable to perform ROS: Mental status change    Physical Exam Updated Vital Signs BP 103/81   Pulse 84   Resp 14   LMP 09/27/2013   SpO2 94%  Physical Exam Vitals and nursing note reviewed.   48 year old female, resting comfortably and in no acute distress. Vital signs are normal. Oxygen saturation is 94%, which is normal. Head is normocephalic and atraumatic. PERRLA, EOMI. Oropharynx is clear. Neck is nontender and supple without adenopathy. Lungs are clear without rales, wheezes, or rhonchi. Chest is nontender. Heart has regular rate and rhythm without murmur. Abdomen is soft, flat, nontender. Extremities have no cyanosis or edema, full range of motion is present. Skin is warm and dry without rash. Neurologic: Awake and able to answer some simple questions but needs extensive direction to answer even a simple question.  She is oriented to person but not place or time.  She is answering questions which have not  been asked.  Cranial nerves are grossly intact and she moves all extremities equally.  ED Results / Procedures / Treatments   Labs (all labs ordered are listed, but only abnormal results are displayed) Labs Reviewed  ACETAMINOPHEN LEVEL - Abnormal; Notable for the following components:      Result Value   Acetaminophen (Tylenol), Serum <10 (*)    All other components within normal limits  CBC WITH DIFFERENTIAL/PLATELET - Abnormal; Notable for the following components:   Hemoglobin 15.2 (*)    Platelets 141 (*)    All other components within normal limits   URINALYSIS, W/ REFLEX TO CULTURE (INFECTION SUSPECTED) - Abnormal; Notable for the following components:   Hgb urine dipstick SMALL (*)    Protein, ur 30 (*)    Bacteria, UA RARE (*)    All other components within normal limits  RAPID URINE DRUG SCREEN, HOSP PERFORMED - Abnormal; Notable for the following components:   Opiates POSITIVE (*)    Benzodiazepines POSITIVE (*)    All other components within normal limits  CBG MONITORING, ED - Abnormal; Notable for the following components:   Glucose-Capillary 112 (*)    All other components within normal limits  COMPREHENSIVE METABOLIC PANEL  ETHANOL  TROPONIN I (HIGH SENSITIVITY)  TROPONIN I (HIGH SENSITIVITY)    EKG EKG Interpretation Date/Time:  Tuesday October 06 2023 00:19:54 EST Ventricular Rate:  77 PR Interval:  127 QRS Duration:  92 QT Interval:  357 QTC Calculation: 404 R Axis:   98  Text Interpretation: Sinus rhythm Borderline right axis deviation When compared with ECG of 03/12/2016, T wave abnormality is no longer present Confirmed by Dione Booze ([id number]) on 10/06/2023 12:21:58 AM  Procedures Procedures  Cardiac monitor shows normal sinus rhythm, per my interpretation.  Medications Ordered in ED Medications  nicotine (NICODERM CQ - dosed in mg/24 hours) patch 14 mg (has no administration in time range)  alum & mag hydroxide-simeth (MAALOX/MYLANTA) 200-200-20 MG/5ML suspension 30 mL (has no administration in time range)  acetaminophen (TYLENOL) tablet 650 mg (has no administration in time range)  ondansetron (ZOFRAN) tablet 4 mg (has no administration in time range)  albuterol (VENTOLIN HFA) 108 (90 Base) MCG/ACT inhaler 2 puff (has no administration in time range)  clonazePAM (KLONOPIN) tablet 1 mg (has no administration in time range)    ED Course/ Medical Decision Making/ A&P                                 Medical Decision Making Amount and/or Complexity of Data Reviewed Labs: ordered.  Risk OTC  drugs. Prescription drug management.   Altered mental status with possible overdose.  She does not have pinpoint pupils, she is awake and alert, no evidence of sedation or respiratory depression.  Clinically, she is not presenting as an opioid or benzodiazepine overdose.  This may be a primary psychiatric problem.  I have reviewed her record on the controlled substance reporting website and on 09/25/2023 she had prescription for 15 morphine 30 mg tablets filled, and also 420 oxycodone 20 mg tablets.  I have ordered screening labs including urine drug screen and she will need to be observed closely.  I have reviewed her past records, and I can see no relevant past visits.  I have reviewed her laboratory tests, and my interpretation is normal CBC, normal comprehensive metabolic panel, undetectable ethanol and salicylate and acetaminophen levels, unremarkable urinalysis, drug screen positive for  benzodiazepines and opiates consistent with her known prescribed medications.  I have reevaluated the patient, she is still markedly disoriented and I suspect that primary psychiatric event.  I have requested consultation with TTS.  Final Clinical Impression(s) / ED Diagnoses Final diagnoses:  Overdose of undetermined intent, initial encounter    Rx / DC Orders ED Discharge Orders     None         Dione Booze, MD 10/06/23 [phone]

## 2023-10-07 NOTE — ED Notes (Signed)
Pt medically cleared per Dr Estell Harpin.

## 2023-10-07 NOTE — Discharge Instructions (Addendum)
Follow up with [facility] or your family md or [facility]

## 2023-10-07 NOTE — ED Notes (Signed)
Pt had iv to left ac not documented. Iv taken out, tip of cath intact, bleeding controlled. Site clean and dry. Nad.

## 2023-10-24 DIAGNOSIS — M545 Low back pain, unspecified: Secondary | ICD-10-CM | POA: Diagnosis not present

## 2023-10-24 DIAGNOSIS — F32A Depression, unspecified: Secondary | ICD-10-CM | POA: Diagnosis not present

## 2023-10-24 DIAGNOSIS — G894 Chronic pain syndrome: Secondary | ICD-10-CM | POA: Diagnosis not present

## 2023-10-24 DIAGNOSIS — M25512 Pain in left shoulder: Secondary | ICD-10-CM | POA: Diagnosis not present

## 2023-10-24 DIAGNOSIS — F419 Anxiety disorder, unspecified: Secondary | ICD-10-CM | POA: Diagnosis not present

## 2023-10-24 DIAGNOSIS — M542 Cervicalgia: Secondary | ICD-10-CM | POA: Diagnosis not present

## 2023-12-03 DIAGNOSIS — F419 Anxiety disorder, unspecified: Secondary | ICD-10-CM | POA: Diagnosis not present

## 2023-12-03 DIAGNOSIS — Z79899 Other long term (current) drug therapy: Secondary | ICD-10-CM | POA: Diagnosis not present

## 2023-12-03 DIAGNOSIS — M542 Cervicalgia: Secondary | ICD-10-CM | POA: Diagnosis not present

## 2023-12-03 DIAGNOSIS — G894 Chronic pain syndrome: Secondary | ICD-10-CM | POA: Diagnosis not present

## 2023-12-03 DIAGNOSIS — M25512 Pain in left shoulder: Secondary | ICD-10-CM | POA: Diagnosis not present

## 2023-12-03 DIAGNOSIS — M545 Low back pain, unspecified: Secondary | ICD-10-CM | POA: Diagnosis not present

## 2023-12-03 DIAGNOSIS — F32A Depression, unspecified: Secondary | ICD-10-CM | POA: Diagnosis not present

## 2023-12-07 DIAGNOSIS — Z79899 Other long term (current) drug therapy: Secondary | ICD-10-CM | POA: Diagnosis not present

## 2024-01-05 DIAGNOSIS — M25512 Pain in left shoulder: Secondary | ICD-10-CM | POA: Diagnosis not present

## 2024-01-05 DIAGNOSIS — G894 Chronic pain syndrome: Secondary | ICD-10-CM | POA: Diagnosis not present

## 2024-01-05 DIAGNOSIS — M542 Cervicalgia: Secondary | ICD-10-CM | POA: Diagnosis not present

## 2024-01-05 DIAGNOSIS — F32A Depression, unspecified: Secondary | ICD-10-CM | POA: Diagnosis not present

## 2024-01-05 DIAGNOSIS — M545 Low back pain, unspecified: Secondary | ICD-10-CM | POA: Diagnosis not present

## 2024-01-05 DIAGNOSIS — Z79899 Other long term (current) drug therapy: Secondary | ICD-10-CM | POA: Diagnosis not present

## 2024-01-05 DIAGNOSIS — F419 Anxiety disorder, unspecified: Secondary | ICD-10-CM | POA: Diagnosis not present

## 2024-01-07 DIAGNOSIS — Z79899 Other long term (current) drug therapy: Secondary | ICD-10-CM | POA: Diagnosis not present

## 2024-01-14 DIAGNOSIS — T7840XA Allergy, unspecified, initial encounter: Secondary | ICD-10-CM | POA: Diagnosis not present

## 2024-01-29 DIAGNOSIS — G894 Chronic pain syndrome: Secondary | ICD-10-CM | POA: Diagnosis not present

## 2024-01-29 DIAGNOSIS — M25512 Pain in left shoulder: Secondary | ICD-10-CM | POA: Diagnosis not present

## 2024-01-29 DIAGNOSIS — F419 Anxiety disorder, unspecified: Secondary | ICD-10-CM | POA: Diagnosis not present

## 2024-01-29 DIAGNOSIS — F32A Depression, unspecified: Secondary | ICD-10-CM | POA: Diagnosis not present

## 2024-01-29 DIAGNOSIS — Z79899 Other long term (current) drug therapy: Secondary | ICD-10-CM | POA: Diagnosis not present

## 2024-01-29 DIAGNOSIS — M542 Cervicalgia: Secondary | ICD-10-CM | POA: Diagnosis not present

## 2024-01-29 DIAGNOSIS — M545 Low back pain, unspecified: Secondary | ICD-10-CM | POA: Diagnosis not present

## 2024-02-25 DIAGNOSIS — M961 Postlaminectomy syndrome, not elsewhere classified: Secondary | ICD-10-CM | POA: Diagnosis not present

## 2024-02-25 DIAGNOSIS — R5383 Other fatigue: Secondary | ICD-10-CM | POA: Diagnosis not present

## 2024-02-25 DIAGNOSIS — Z131 Encounter for screening for diabetes mellitus: Secondary | ICD-10-CM | POA: Diagnosis not present

## 2024-02-25 DIAGNOSIS — M129 Arthropathy, unspecified: Secondary | ICD-10-CM | POA: Diagnosis not present

## 2024-02-25 DIAGNOSIS — F419 Anxiety disorder, unspecified: Secondary | ICD-10-CM | POA: Diagnosis not present

## 2024-02-25 DIAGNOSIS — Z6821 Body mass index (BMI) 21.0-21.9, adult: Secondary | ICD-10-CM | POA: Diagnosis not present

## 2024-02-25 DIAGNOSIS — G894 Chronic pain syndrome: Secondary | ICD-10-CM | POA: Diagnosis not present

## 2024-02-25 DIAGNOSIS — M542 Cervicalgia: Secondary | ICD-10-CM | POA: Diagnosis not present

## 2024-02-25 DIAGNOSIS — R030 Elevated blood-pressure reading, without diagnosis of hypertension: Secondary | ICD-10-CM | POA: Diagnosis not present

## 2024-02-25 DIAGNOSIS — Z79899 Other long term (current) drug therapy: Secondary | ICD-10-CM | POA: Diagnosis not present

## 2024-02-25 DIAGNOSIS — F32A Depression, unspecified: Secondary | ICD-10-CM | POA: Diagnosis not present

## 2024-02-25 DIAGNOSIS — M25512 Pain in left shoulder: Secondary | ICD-10-CM | POA: Diagnosis not present

## 2024-02-25 DIAGNOSIS — F17210 Nicotine dependence, cigarettes, uncomplicated: Secondary | ICD-10-CM | POA: Diagnosis not present

## 2024-02-29 DIAGNOSIS — Z79899 Other long term (current) drug therapy: Secondary | ICD-10-CM | POA: Diagnosis not present

## 2024-03-09 DIAGNOSIS — F411 Generalized anxiety disorder: Secondary | ICD-10-CM | POA: Diagnosis not present

## 2024-03-09 DIAGNOSIS — M961 Postlaminectomy syndrome, not elsewhere classified: Secondary | ICD-10-CM | POA: Diagnosis not present

## 2024-03-09 DIAGNOSIS — Z79899 Other long term (current) drug therapy: Secondary | ICD-10-CM | POA: Diagnosis not present

## 2024-03-09 DIAGNOSIS — F17210 Nicotine dependence, cigarettes, uncomplicated: Secondary | ICD-10-CM | POA: Diagnosis not present

## 2024-03-09 DIAGNOSIS — R030 Elevated blood-pressure reading, without diagnosis of hypertension: Secondary | ICD-10-CM | POA: Diagnosis not present

## 2024-03-09 DIAGNOSIS — Z6822 Body mass index (BMI) 22.0-22.9, adult: Secondary | ICD-10-CM | POA: Diagnosis not present

## 2024-03-17 DIAGNOSIS — Z79899 Other long term (current) drug therapy: Secondary | ICD-10-CM | POA: Diagnosis not present

## 2024-03-23 DIAGNOSIS — Z6822 Body mass index (BMI) 22.0-22.9, adult: Secondary | ICD-10-CM | POA: Diagnosis not present

## 2024-03-23 DIAGNOSIS — Z79899 Other long term (current) drug therapy: Secondary | ICD-10-CM | POA: Diagnosis not present

## 2024-03-23 DIAGNOSIS — F411 Generalized anxiety disorder: Secondary | ICD-10-CM | POA: Diagnosis not present

## 2024-03-23 DIAGNOSIS — M961 Postlaminectomy syndrome, not elsewhere classified: Secondary | ICD-10-CM | POA: Diagnosis not present

## 2024-03-23 DIAGNOSIS — F17210 Nicotine dependence, cigarettes, uncomplicated: Secondary | ICD-10-CM | POA: Diagnosis not present

## 2024-03-23 DIAGNOSIS — R030 Elevated blood-pressure reading, without diagnosis of hypertension: Secondary | ICD-10-CM | POA: Diagnosis not present

## 2024-03-23 DIAGNOSIS — F332 Major depressive disorder, recurrent severe without psychotic features: Secondary | ICD-10-CM | POA: Diagnosis not present

## 2024-03-29 DIAGNOSIS — Z79899 Other long term (current) drug therapy: Secondary | ICD-10-CM | POA: Diagnosis not present

## 2024-04-05 DIAGNOSIS — Z6822 Body mass index (BMI) 22.0-22.9, adult: Secondary | ICD-10-CM | POA: Diagnosis not present

## 2024-04-05 DIAGNOSIS — Z79899 Other long term (current) drug therapy: Secondary | ICD-10-CM | POA: Diagnosis not present

## 2024-04-05 DIAGNOSIS — M961 Postlaminectomy syndrome, not elsewhere classified: Secondary | ICD-10-CM | POA: Diagnosis not present

## 2024-04-05 DIAGNOSIS — F411 Generalized anxiety disorder: Secondary | ICD-10-CM | POA: Diagnosis not present

## 2024-04-05 DIAGNOSIS — F17210 Nicotine dependence, cigarettes, uncomplicated: Secondary | ICD-10-CM | POA: Diagnosis not present

## 2024-04-07 DIAGNOSIS — Z79899 Other long term (current) drug therapy: Secondary | ICD-10-CM | POA: Diagnosis not present

## 2024-04-20 DIAGNOSIS — F17210 Nicotine dependence, cigarettes, uncomplicated: Secondary | ICD-10-CM | POA: Diagnosis not present

## 2024-04-20 DIAGNOSIS — M961 Postlaminectomy syndrome, not elsewhere classified: Secondary | ICD-10-CM | POA: Diagnosis not present

## 2024-04-20 DIAGNOSIS — Z79899 Other long term (current) drug therapy: Secondary | ICD-10-CM | POA: Diagnosis not present

## 2024-04-20 DIAGNOSIS — Z1211 Encounter for screening for malignant neoplasm of colon: Secondary | ICD-10-CM | POA: Diagnosis not present

## 2024-04-20 DIAGNOSIS — R030 Elevated blood-pressure reading, without diagnosis of hypertension: Secondary | ICD-10-CM | POA: Diagnosis not present

## 2024-04-20 DIAGNOSIS — R0602 Shortness of breath: Secondary | ICD-10-CM | POA: Diagnosis not present

## 2024-04-20 DIAGNOSIS — Z780 Asymptomatic menopausal state: Secondary | ICD-10-CM | POA: Diagnosis not present

## 2024-04-20 DIAGNOSIS — F411 Generalized anxiety disorder: Secondary | ICD-10-CM | POA: Diagnosis not present

## 2024-04-20 DIAGNOSIS — Z6822 Body mass index (BMI) 22.0-22.9, adult: Secondary | ICD-10-CM | POA: Diagnosis not present

## 2024-04-25 DIAGNOSIS — Z79899 Other long term (current) drug therapy: Secondary | ICD-10-CM | POA: Diagnosis not present

## 2024-04-28 ENCOUNTER — Encounter: Payer: Self-pay | Admitting: Physician Assistant

## 2024-04-28 ENCOUNTER — Other Ambulatory Visit (HOSPITAL_COMMUNITY): Payer: Self-pay | Admitting: Family Medicine

## 2024-04-28 ENCOUNTER — Telehealth: Admitting: Physician Assistant

## 2024-04-28 DIAGNOSIS — K047 Periapical abscess without sinus: Secondary | ICD-10-CM | POA: Diagnosis not present

## 2024-04-28 DIAGNOSIS — K08129 Complete loss of teeth due to periodontal diseases, unspecified class: Secondary | ICD-10-CM

## 2024-04-28 DIAGNOSIS — Z780 Asymptomatic menopausal state: Secondary | ICD-10-CM

## 2024-04-28 DIAGNOSIS — R220 Localized swelling, mass and lump, head: Secondary | ICD-10-CM

## 2024-04-28 MED ORDER — CLINDAMYCIN HCL 300 MG PO CAPS: 300.0000 mg | ORAL_CAPSULE | Freq: Three times a day (TID) | ORAL | 0 refills | Status: AC

## 2024-04-28 MED ORDER — IBUPROFEN 800 MG PO TABS: 800.0000 mg | ORAL_TABLET | Freq: Three times a day (TID) | ORAL | 0 refills | Status: AC | PRN

## 2024-04-28 NOTE — Patient Instructions (Addendum)
 [name], thank you for joining Chas Axel, PA-C for today's virtual visit.  While this provider is not your primary care provider (PCP), if your PCP is located in our provider database this encounter information will be shared with them immediately following your visit.   A [facility] MyChart account gives you access to today's visit and all your visits, tests, and labs performed at [facility]  click here if you don't have a [facility] MyChart account or go to [email or web address]  Consent: (Patient) [name] provided verbal consent for this virtual visit at the beginning of the encounter.  Current Medications:  Current Outpatient Medications:    clindamycin  (CLEOCIN ) 300 MG capsule, Take 1 capsule (300 mg total) by mouth 3 (three) times daily for 10 days., Disp: 30 capsule, Rfl: 0   ibuprofen  (ADVIL ) 800 MG tablet, Take 1 tablet (800 mg total) by mouth every 8 (eight) hours as needed for up to 7 days for moderate pain (pain score 4-6)., Disp: 21 tablet, Rfl: 0   acetaminophen  (TYLENOL ) 500 MG tablet, Take 500 mg by mouth every 6 (six) hours as needed for mild pain, moderate pain or fever. (Patient not taking: Reported on 10/06/2023), Disp: , Rfl:    albuterol  (PROVENTIL  HFA;VENTOLIN  HFA) 108 (90 BASE) MCG/ACT inhaler, Inhale 2 puffs into the lungs every 6 (six) hours as needed for wheezing or shortness of breath. (Patient not taking: Reported on 10/06/2023), Disp: , Rfl:    clonazePAM  (KLONOPIN ) 1 MG tablet, Take 1 mg by mouth daily., Disp: , Rfl:    morphine  (MS CONTIN ) 30 MG 12 hr tablet, Take 30 mg by mouth every 12 (twelve) hours., Disp: , Rfl:    Oxycodone  HCl 10 MG TABS, Take 20 mg by mouth 5 (five) times daily. , Disp: , Rfl:    tiZANidine (ZANAFLEX) 4 MG tablet, Take 4 mg by mouth 2 (two) times daily., Disp: , Rfl:    traMADol  (ULTRAM ) 50 MG tablet, Take 1 tablet (50 mg total) by mouth every 6 (six) hours as needed. (Patient not taking: Reported on  10/06/2023), Disp: 12 tablet, Rfl: 0   Medications ordered in this encounter:  Meds ordered this encounter  Medications   clindamycin  (CLEOCIN ) 300 MG capsule    Sig: Take 1 capsule (300 mg total) by mouth 3 (three) times daily for 10 days.    Dispense:  30 capsule    Refill:  0    Supervising Provider:   LAMPTEY, PHILIP O [[id number]]   ibuprofen  (ADVIL ) 800 MG tablet    Sig: Take 1 tablet (800 mg total) by mouth every 8 (eight) hours as needed for up to 7 days for moderate pain (pain score 4-6).    Dispense:  21 tablet    Refill:  0    Supervising Provider:   BLAISE ALEENE KIDD [[id number]]     *If you need refills on other medications prior to your next appointment, please contact your pharmacy*  Follow-Up: Call back or seek an in-person evaluation if the symptoms worsen or if the condition fails to improve as anticipated.  [facility] [phone]  Other Instructions Please schedule a follow-up appointment with dentist  Medications as directed,  Ibuprofen  800 mg with milk or food every 8 hours, may take tylenol  1000 mg very 6 hours in between If needed  Cool compress  ED if onset of new or worsening symptoms such as neck swelling, drooling, locked jaw, unable to open mouth, difficulty swallowing.  If you have been instructed to have an in-person evaluation today at a local Urgent Care facility, please use the link below. It will take you to a list of all of our available [facility], including address, phone number and hours of operation. Please do not delay care.  [facility]  If you or a family member do not have a primary care provider, use the link below to schedule a visit and establish care. When you choose a [facility] primary care physician or advanced practice provider, you gain a long-term partner in health. Find a Primary Care Provider  Learn more about [facility]'s in-office and virtual care options: [facility] - Get Care  Now

## 2024-04-28 NOTE — Progress Notes (Signed)
 Virtual Visit Consent   [name], you are scheduled for a virtual visit with a [facility] provider today. Just as with appointments in the office, your consent must be obtained to participate. Your consent will be active for this visit and any virtual visit you may have with one of our providers in the next 365 days. If you have a MyChart account, a copy of this consent can be sent to you electronically.  As this is a virtual visit, video technology does not allow for your provider to perform a traditional examination. This may limit your provider's ability to fully assess your condition. If your provider identifies any concerns that need to be evaluated in person or the need to arrange testing (such as labs, EKG, etc.), we will make arrangements to do so. Although advances in technology are sophisticated, we cannot ensure that it will always work on either your end or our end. If the connection with a video visit is poor, the visit may have to be switched to a telephone visit. With either a video or telephone visit, we are not always able to ensure that we have a secure connection.  By engaging in this virtual visit, you consent to the provision of healthcare and authorize for your insurance to be billed (if applicable) for the services provided during this visit. Depending on your insurance coverage, you may receive a charge related to this service.  I need to obtain your verbal consent now. Are you willing to proceed with your visit today? [name] has provided verbal consent on 04/28/2024 for a virtual visit (video or telephone). Zenon Leaf, PA-C  Date: 04/28/2024 2:02 PM   Virtual Visit via Video Note   I, [name], connected with  [name]  ([id number], [birth year]) on 04/28/24 at  1:45 PM EDT by a video-enabled telemedicine application and verified that I am speaking with the correct person using two identifiers.  Location: Patient: Virtual Visit Location Patient:  Home Provider: Virtual Visit Location Provider: Home Office   I discussed the limitations of evaluation and management by telemedicine and the availability of in person appointments. The patient expressed understanding and agreed to proceed.    History of Present Illness: [name] is a 48 y.o. who identifies as a female who was assigned female at birth, and is being seen today for right lower tooth pain x 2-3 days. Associated with facial swelling, subjective fever, did not check temp. Pt reports pain is  9/10, non-radiating, sharp, shooting pain. Reports hx of broken tooth due to gum disease.  No compress tried. Ibuprofen  400 mg every 8 hours. Dentist: does not have one at the moment. Denies ear pain, neck pain, drooling, difficulty swallowing. Denies trauma, numbness, tingling, vision changes, nausea, vomiting at this time. Pt denies hx of renal disease, CI to taking NSAIDS.         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   OBESITY NOS 10/26/2006   COMMON MIGRAINE 10/26/2006   LOW BACK PAIN, CHRONIC 10/26/2006    Allergies:  Allergies  Allergen  Reactions   Darvocet [Propoxyphene N-Acetaminophen ] Nausea Only   Penicillins     REACTION: Not sure - mom told her she was allergic   Medications:  Current Outpatient Medications:    clindamycin  (CLEOCIN ) 300 MG capsule, Take 1 capsule (300 mg total) by mouth 3 (three) times daily for 10 days., Disp: 30 capsule, Rfl: 0   ibuprofen  (ADVIL ) 800 MG tablet, Take 1 tablet (800 mg total) by mouth every 8 (eight) hours as needed for up  to 7 days for moderate pain (pain score 4-6)., Disp: 21 tablet, Rfl: 0   acetaminophen  (TYLENOL ) 500 MG tablet, Take 500 mg by mouth every 6 (six) hours as needed for mild pain, moderate pain or fever. (Patient not taking: Reported on 10/06/2023), Disp: , Rfl:    albuterol  (PROVENTIL  HFA;VENTOLIN  HFA) 108 (90 BASE) MCG/ACT inhaler, Inhale 2 puffs into the lungs every 6 (six) hours as needed for wheezing or shortness of breath. (Patient not taking: Reported on 10/06/2023), Disp: , Rfl:    clonazePAM  (KLONOPIN ) 1 MG tablet, Take 1 mg by mouth daily., Disp: , Rfl:    morphine  (MS CONTIN ) 30 MG 12 hr tablet, Take 30 mg by mouth every 12 (twelve) hours., Disp: , Rfl:    Oxycodone  HCl 10 MG TABS, Take 20 mg by mouth 5 (five) times daily. , Disp: , Rfl:    tiZANidine (ZANAFLEX) 4 MG tablet, Take 4 mg by mouth 2 (two) times daily., Disp: , Rfl:    traMADol  (ULTRAM ) 50 MG tablet, Take 1 tablet (50 mg total) by mouth every 6 (six) hours as needed. (Patient not taking: Reported on 10/06/2023), Disp: 12 tablet, Rfl: 0  Observations/Objective: Patient is well-developed, well-nourished in no acute distress.  Resting comfortably at home.  Facial edema noted on the right lower jaw, no overlying skins changes noted. No drooling, no trismus noted Mouth: pt able to open mouth, missing teeth, caries noted, right lower side, # 28 with fractured tooth, edema, no obvious drainage noted over video  Neck: reports left anterior cervical adenopathy, able to swallow without difficulty over video FROM of the neck No labored breathing. Speech is clear and coherent with logical content.  Patient is alert and oriented at baseline.    Assessment and Plan: 1. Dental infection (Primary) - clindamycin  (CLEOCIN ) 300 MG capsule; Take 1 capsule (300 mg total) by mouth 3 (three) times daily for 10 days.  Dispense: 30 capsule; Refill: 0 - ibuprofen  (ADVIL ) 800 MG tablet; Take 1 tablet (800 mg total) by mouth every 8 (eight) hours as  needed for up to 7 days for moderate pain (pain score 4-6).  Dispense: 21 tablet; Refill: 0  2. Right facial swelling  3. Loss of teeth due to periodontal disease, unspecified edentulism class   Medications as directed,  Ibuprofen  800 mg with milk or food every 8 hours, may take tylenol  1000 mg very 6 hours in between If needed  Cool compress  ED if onset of new or worsening symptoms such as neck swelling, drooling, locked jaw, unable to open mouth, difficulty swallowing    Follow Up Instructions: I discussed the assessment and treatment plan with the patient. The patient was provided an opportunity to ask questions and all were answered. The patient agreed with the plan and demonstrated an understanding of the instructions.  A copy of instructions were sent to the patient via MyChart unless otherwise noted below.    The patient was advised to call back or seek an in-person evaluation if  the symptoms worsen or if the condition fails to improve as anticipated.    Jocie Meroney, PA-C

## 2024-05-04 DIAGNOSIS — R030 Elevated blood-pressure reading, without diagnosis of hypertension: Secondary | ICD-10-CM | POA: Diagnosis not present

## 2024-05-04 DIAGNOSIS — Z79899 Other long term (current) drug therapy: Secondary | ICD-10-CM | POA: Diagnosis not present

## 2024-05-04 DIAGNOSIS — F411 Generalized anxiety disorder: Secondary | ICD-10-CM | POA: Diagnosis not present

## 2024-05-04 DIAGNOSIS — M961 Postlaminectomy syndrome, not elsewhere classified: Secondary | ICD-10-CM | POA: Diagnosis not present

## 2024-05-04 DIAGNOSIS — Z6822 Body mass index (BMI) 22.0-22.9, adult: Secondary | ICD-10-CM | POA: Diagnosis not present

## 2024-05-04 DIAGNOSIS — F17210 Nicotine dependence, cigarettes, uncomplicated: Secondary | ICD-10-CM | POA: Diagnosis not present

## 2024-05-10 DIAGNOSIS — Z79899 Other long term (current) drug therapy: Secondary | ICD-10-CM | POA: Diagnosis not present

## 2024-06-02 DIAGNOSIS — E8940 Asymptomatic postprocedural ovarian failure: Secondary | ICD-10-CM | POA: Diagnosis not present

## 2024-06-02 DIAGNOSIS — M961 Postlaminectomy syndrome, not elsewhere classified: Secondary | ICD-10-CM | POA: Diagnosis not present

## 2024-06-02 DIAGNOSIS — F17210 Nicotine dependence, cigarettes, uncomplicated: Secondary | ICD-10-CM | POA: Diagnosis not present

## 2024-06-02 DIAGNOSIS — F411 Generalized anxiety disorder: Secondary | ICD-10-CM | POA: Diagnosis not present

## 2024-06-02 DIAGNOSIS — Z6822 Body mass index (BMI) 22.0-22.9, adult: Secondary | ICD-10-CM | POA: Diagnosis not present

## 2024-06-02 DIAGNOSIS — Z79899 Other long term (current) drug therapy: Secondary | ICD-10-CM | POA: Diagnosis not present

## 2024-06-02 DIAGNOSIS — R030 Elevated blood-pressure reading, without diagnosis of hypertension: Secondary | ICD-10-CM | POA: Diagnosis not present

## 2024-06-06 DIAGNOSIS — Z79899 Other long term (current) drug therapy: Secondary | ICD-10-CM | POA: Diagnosis not present

## 2024-07-01 DIAGNOSIS — Z79899 Other long term (current) drug therapy: Secondary | ICD-10-CM | POA: Diagnosis not present

## 2024-07-01 DIAGNOSIS — Z6822 Body mass index (BMI) 22.0-22.9, adult: Secondary | ICD-10-CM | POA: Diagnosis not present

## 2024-07-01 DIAGNOSIS — F17210 Nicotine dependence, cigarettes, uncomplicated: Secondary | ICD-10-CM | POA: Diagnosis not present

## 2024-07-01 DIAGNOSIS — M961 Postlaminectomy syndrome, not elsewhere classified: Secondary | ICD-10-CM | POA: Diagnosis not present

## 2024-07-01 DIAGNOSIS — R030 Elevated blood-pressure reading, without diagnosis of hypertension: Secondary | ICD-10-CM | POA: Diagnosis not present

## 2024-07-01 DIAGNOSIS — F411 Generalized anxiety disorder: Secondary | ICD-10-CM | POA: Diagnosis not present

## 2024-07-06 DIAGNOSIS — Z79899 Other long term (current) drug therapy: Secondary | ICD-10-CM | POA: Diagnosis not present

## 2024-07-07 ENCOUNTER — Telehealth: Admitting: Physician Assistant

## 2024-07-07 DIAGNOSIS — K047 Periapical abscess without sinus: Secondary | ICD-10-CM | POA: Diagnosis not present

## 2024-07-07 MED ORDER — NAPROXEN 500 MG PO TABS: 500.0000 mg | ORAL_TABLET | Freq: Two times a day (BID) | ORAL | 0 refills | Status: DC

## 2024-07-07 MED ORDER — CLINDAMYCIN HCL 300 MG PO CAPS: 300.0000 mg | ORAL_CAPSULE | Freq: Three times a day (TID) | ORAL | 0 refills | Status: AC

## 2024-07-07 MED ORDER — ONDANSETRON 4 MG PO TBDP: 4.0000 mg | ORAL_TABLET | Freq: Three times a day (TID) | ORAL | 0 refills | Status: AC | PRN

## 2024-07-07 NOTE — Patient Instructions (Addendum)
 [name], thank you for joining [name], PA-C for today's virtual visit.  While this provider is not your primary care provider (PCP), if your PCP is located in our provider database this encounter information will be shared with them immediately following your visit.   A [facility] MyChart account gives you access to today's visit and all your visits, tests, and labs performed at [facility]  click here if you don't have a [facility] MyChart account or go to [email or web address]  Consent: (Patient) [name] provided verbal consent for this virtual visit at the beginning of the encounter.  Current Medications:  Current Outpatient Medications:    acetaminophen  (TYLENOL ) 500 MG tablet, Take 500 mg by mouth every 6 (six) hours as needed for mild pain, moderate pain or fever. (Patient not taking: Reported on 10/06/2023), Disp: , Rfl:    albuterol  (PROVENTIL  HFA;VENTOLIN  HFA) 108 (90 BASE) MCG/ACT inhaler, Inhale 2 puffs into the lungs every 6 (six) hours as needed for wheezing or shortness of breath. (Patient not taking: Reported on 10/06/2023), Disp: , Rfl:    clonazePAM  (KLONOPIN ) 1 MG tablet, Take 1 mg by mouth daily., Disp: , Rfl:    morphine  (MS CONTIN ) 30 MG 12 hr tablet, Take 30 mg by mouth every 12 (twelve) hours., Disp: , Rfl:    Oxycodone  HCl 10 MG TABS, Take 20 mg by mouth 5 (five) times daily. , Disp: , Rfl:    tiZANidine (ZANAFLEX) 4 MG tablet, Take 4 mg by mouth 2 (two) times daily., Disp: , Rfl:    traMADol  (ULTRAM ) 50 MG tablet, Take 1 tablet (50 mg total) by mouth every 6 (six) hours as needed. (Patient not taking: Reported on 10/06/2023), Disp: 12 tablet, Rfl: 0   Medications ordered in this encounter:  No orders of the defined types were placed in this encounter.    *If you need refills on other medications prior to your next appointment, please contact your pharmacy*  Follow-Up: Call back or seek an in-person evaluation if the symptoms  worsen or if the condition fails to improve as anticipated.  [facility] [phone]  Other Instructions Dental Abscess  A dental abscess is an area of pus in or around a tooth. It comes from an infection. It can cause pain and other symptoms. Treatment will help with symptoms and prevent the infection from spreading. What are the causes? This condition is caused by an infection in or around the tooth. This can be from: Very bad tooth decay (cavities). A bad injury to the tooth, such as a broken or chipped tooth. What increases the risk? The risk to get an abscess is higher in males. It is also more likely in people who: Have dental decay. Have very bad gum disease. Eat sugary snacks between meals. Use tobacco. Have diabetes. Have a weak disease-fighting system (immune system). Do not brush their teeth regularly. What are the signs or symptoms? Some mild symptoms are: Tenderness. Bad breath. Fever. A sharp, sour taste in the mouth. Pain in and around the infected tooth. Worse symptoms of this condition include: Swollen neck glands. Chills. Pus draining around the tooth. Swelling and redness around the tooth, the mouth, or the face. Very bad pain in and around the tooth. The worst symptoms can include: Difficulty swallowing. Difficulty opening your mouth. Feeling like you may vomit or vomiting. How is this treated? This is treated by getting rid of the infection. Your dentist will discuss ways to  do this, including: Antibiotic medicines. Antibacterial mouth rinse. An incision in the abscess to drain out the pus. A root canal. Removing the tooth. Follow these instructions at home: Medicines Take over-the-counter and prescription medicines only as told by your dentist. If you were prescribed an antibiotic medicine, take it as told by your dentist. Do not stop taking it even if you start to feel better. If you were prescribed a gel that has numbing  medicine in it, use it exactly as told. Ask your dentist if you should avoid driving or using machines while you are taking your medicine. General instructions Rinse your mouth often with salt water. To make salt water, dissolve -1 tsp (3-6 g) of salt in 1 cup (237 mL) of warm water. Eat a soft diet while your mouth is healing. Drink enough fluid to keep your pee (urine) pale yellow. Do not apply heat to the outside of your mouth. Do not smoke or use any products that contain nicotine  or tobacco. If you need help quitting, ask your dentist. Keep all follow-up visits. Prevent an abscess Brush your teeth every morning and every night. Use fluoride toothpaste. Floss your teeth each day. Get dental cleanings as often as told by your dentist. Think about getting dental sealant put on teeth that have deep holes (decay). Drink water that has fluoride in it. Most tap water has fluoride. Check the label on bottled water to see if it has fluoride in it. Drink water instead of sugary drinks. Eat healthy meals and snacks. Wear a mouth guard or face shield when you play sports. Contact a doctor if: Your pain is worse and medicine does not help. Get help right away if: You have a fever or chills. Your symptoms suddenly get worse. You have a very bad headache. You have problems breathing or swallowing. You have trouble opening your mouth. You have swelling in your neck or close to your eye. These symptoms may be an emergency. Get help right away. Call your local emergency services (911 in the [place] Do not wait to see if the symptoms will go away. Do not drive yourself to the hospital. Summary A dental abscess is an area of pus in or around a tooth. It is caused by an infection. Treatment will help with symptoms and prevent the infection from spreading. Take over-the-counter and prescription medicines only as told by your dentist. To prevent an abscess, take good care of your teeth. Brush your  teeth every morning and night. Use floss every day. Get dental cleanings as often as told by your dentist. This information is not intended to replace advice given to you by your health care provider. Make sure you discuss any questions you have with your health care provider. Document Revised: 10/31/2020 Document Reviewed: 11/01/2020 Elsevier Patient Education  2024 [employer].  [facility] [phone]   [facility] [phone]   [facility] [phone]   [facility] [phone]   [facility] [phone]   [facility] [phone]   [facility] [phone]   [facility] [place] [phone]   Medicaid Children under 21 [facility] (2 locations: [place] and [place])   [place]: [phone]   [place]: [phone]       [facility]       [facility] [phone]   [facility] [phone]   [facility] [phone] (Resource; not on site)  [facility] [phone]       [facility] [phone]       [facility]       [facility] [phone]       [facility]       Emergency Dentist [facility] [phone]   [facility] [phone]   [facility] [phone]       [facility]       [facility] [phone]   [facility] [phone]   [facility] [phone]   [facility] [phone]      If you have been instructed to have an in-person evaluation today at a local Urgent Care facility, please use the link below. It will take you to a list of all of our available [facility], including address, phone number and hours of operation. Please do not delay care.  [facility]  If you or a family member do  not have a primary care provider, use the link below to schedule a visit and establish care. When you choose a [facility] primary care physician or advanced practice provider, you gain a long-term partner in health. Find a Primary Care Provider  Learn more about [facility]'s in-office and virtual care options: [facility] - Get Care Now

## 2024-07-07 NOTE — Progress Notes (Signed)
 Virtual Visit Consent   [name], you are scheduled for a virtual visit with a [facility] provider today. Just as with appointments in the office, your consent must be obtained to participate. Your consent will be active for this visit and any virtual visit you may have with one of our providers in the next 365 days. If you have a MyChart account, a copy of this consent can be sent to you electronically.  As this is a virtual visit, video technology does not allow for your provider to perform a traditional examination. This may limit your provider's ability to fully assess your condition. If your provider identifies any concerns that need to be evaluated in person or the need to arrange testing (such as labs, EKG, etc.), we will make arrangements to do so. Although advances in technology are sophisticated, we cannot ensure that it will always work on either your end or our end. If the connection with a video visit is poor, the visit may have to be switched to a telephone visit. With either a video or telephone visit, we are not always able to ensure that we have a secure connection.  By engaging in this virtual visit, you consent to the provision of healthcare and authorize for your insurance to be billed (if applicable) for the services provided during this visit. Depending on your insurance coverage, you may receive a charge related to this service.  I need to obtain your verbal consent now. Are you willing to proceed with your visit today? [name] has provided verbal consent on 07/07/2024 for a virtual visit (video or telephone). [name], [place]  Date: 07/07/2024 7:53 PM   Virtual Visit via Video Note   I, [name], connected with  [name]  ([id number], 1975-09-28) on 07/07/24 at  7:45 PM EDT by a video-enabled telemedicine application and verified that I am speaking with the correct person using two identifiers.  Location: Patient: Virtual Visit Location  Patient: Home Provider: Virtual Visit Location Provider: Home Office   I discussed the limitations of evaluation and management by telemedicine and the availability of in person appointments. The patient expressed understanding and agreed to proceed.    History of Present Illness: [name] is a 48 y.o. who identifies as a female who was assigned female at birth, and is being seen today for pain and swelling of right lower tooth starting overnight. Notes history of poor dentition and dental abscess in the same tooth a few months ago. Notes low-grade fever today. Facial pain, nausea. Able to fully open mouth. Does not have a dental provider.    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   OBESITY NOS 10/26/2006   COMMON MIGRAINE 10/26/2006   LOW BACK PAIN, CHRONIC 10/26/2006    Allergies:  Allergies  Allergen Reactions   Darvocet [Propoxyphene N-Acetaminophen ] Nausea Only   Penicillins     REACTION: Not sure - mom told her she was allergic   Medications:  Current Outpatient Medications:    clindamycin  (CLEOCIN ) 300 MG capsule, Take 1 capsule (  300 mg total) by mouth 3 (three) times daily for 7 days., Disp: 21 capsule, Rfl: 0   gabapentin (NEURONTIN) 100 MG capsule, Take 100 mg by mouth 3 (three) times daily as needed., Disp: , Rfl:    naproxen  (NAPROSYN ) 500 MG tablet, Take 1 tablet (500 mg total) by mouth 2 (two) times daily with a meal., Disp: 20 tablet, Rfl: 0   ondansetron  (ZOFRAN -ODT) 4 MG disintegrating tablet, Take 1 tablet (4 mg total) by mouth every 8 (eight) hours as  needed for nausea or vomiting., Disp: 20 tablet, Rfl: 0   Oxycodone  HCl 20 MG TABS, Take 1 tablet by mouth 4 (four) times daily as needed., Disp: , Rfl:    acetaminophen  (TYLENOL ) 500 MG tablet, Take 500 mg by mouth every 6 (six) hours as needed for mild pain, moderate pain or fever. (Patient not taking: Reported on 10/06/2023), Disp: , Rfl:    albuterol  (PROVENTIL  HFA;VENTOLIN  HFA) 108 (90 BASE) MCG/ACT inhaler, Inhale 2 puffs into the lungs every 6 (six) hours as needed for wheezing or shortness of breath. (Patient not taking: Reported on 10/06/2023), Disp: , Rfl:    clonazePAM  (KLONOPIN ) 1 MG tablet, Take 1 mg by mouth daily., Disp: , Rfl:    Oxycodone  HCl 10 MG TABS, Take 20 mg by mouth 5 (five) times daily. , Disp: , Rfl:    tiZANidine (ZANAFLEX) 4 MG tablet, Take 4 mg by mouth 2 (two) times daily., Disp: , Rfl:   Observations/Objective: Patient is well-developed, well-nourished in no acute distress.  Resting comfortably  at home.  Head is normocephalic, atraumatic.  No labored breathing.  Speech is clear and coherent with logical content.  Patient is alert and oriented at baseline.   Assessment and Plan: 1. Dental infection (Primary) - clindamycin  (CLEOCIN ) 300 MG capsule; Take 1 capsule (300 mg total) by mouth 3 (three) times daily for 7 days.  Dispense: 21 capsule; Refill: 0 - naproxen  (NAPROSYN ) 500 MG tablet; Take 1 tablet (500 mg total) by mouth 2 (two) times daily with a meal.  Dispense: 20 tablet; Refill: 0  Supportive measures and OTC medications reviewed. Naprosyn  and Clindamycin  per orders. Will add on Zofran  to help with any nausea. Dental resources provided. Strict ER precautions reviewed.  Follow Up Instructions: I discussed the assessment and treatment plan with the patient. The patient was provided an opportunity to ask questions and all were answered. The patient agreed with the plan and demonstrated an understanding of the instructions.  A copy of instructions were sent  to the patient via MyChart unless otherwise noted below.   The patient was advised to call back or seek an in-person evaluation if the symptoms worsen or if the condition fails to improve as anticipated.    Betty Velma Lunger, PA-C

## 2024-07-29 DIAGNOSIS — Z1211 Encounter for screening for malignant neoplasm of colon: Secondary | ICD-10-CM | POA: Diagnosis not present

## 2024-07-29 DIAGNOSIS — K529 Noninfective gastroenteritis and colitis, unspecified: Secondary | ICD-10-CM | POA: Diagnosis not present

## 2024-07-29 DIAGNOSIS — K635 Polyp of colon: Secondary | ICD-10-CM | POA: Diagnosis not present

## 2024-08-01 DIAGNOSIS — Z79899 Other long term (current) drug therapy: Secondary | ICD-10-CM | POA: Diagnosis not present

## 2024-08-01 DIAGNOSIS — M961 Postlaminectomy syndrome, not elsewhere classified: Secondary | ICD-10-CM | POA: Diagnosis not present

## 2024-08-01 DIAGNOSIS — F17210 Nicotine dependence, cigarettes, uncomplicated: Secondary | ICD-10-CM | POA: Diagnosis not present

## 2024-08-01 DIAGNOSIS — Z1231 Encounter for screening mammogram for malignant neoplasm of breast: Secondary | ICD-10-CM | POA: Diagnosis not present

## 2024-08-01 DIAGNOSIS — Z6822 Body mass index (BMI) 22.0-22.9, adult: Secondary | ICD-10-CM | POA: Diagnosis not present

## 2024-08-03 ENCOUNTER — Telehealth: Admitting: Physician Assistant

## 2024-08-03 DIAGNOSIS — M542 Cervicalgia: Secondary | ICD-10-CM

## 2024-08-03 MED ORDER — TIZANIDINE HCL 4 MG PO TABS: 4.0000 mg | ORAL_TABLET | Freq: Two times a day (BID) | ORAL | 0 refills | Status: AC

## 2024-08-03 NOTE — Progress Notes (Signed)
 Virtual Visit Consent   [name], you are scheduled for a virtual visit with a [facility] provider today. Just as with appointments in the office, your consent must be obtained to participate. Your consent will be active for this visit and any virtual visit you may have with one of our providers in the next 365 days. If you have a MyChart account, a copy of this consent can be sent to you electronically.  As this is a virtual visit, video technology does not allow for your provider to perform a traditional examination. This may limit your provider's ability to fully assess your condition. If your provider identifies any concerns that need to be evaluated in person or the need to arrange testing (such as labs, EKG, etc.), we will make arrangements to do so. Although advances in technology are sophisticated, we cannot ensure that it will always work on either your end or our end. If the connection with a video visit is poor, the visit may have to be switched to a telephone visit. With either a video or telephone visit, we are not always able to ensure that we have a secure connection.  By engaging in this virtual visit, you consent to the provision of healthcare and authorize for your insurance to be billed (if applicable) for the services provided during this visit. Depending on your insurance coverage, you may receive a charge related to this service.  I need to obtain your verbal consent now. Are you willing to proceed with your visit today? [name] has provided verbal consent on 08/03/2024 for a virtual visit (video or telephone). [name], [place]  Date: 08/03/2024 6:10 PM   Virtual Visit via Video Note   I, [name], connected with  [name]  ([id number], 12/16/1975) on 08/03/24 at  6:15 PM EST by a video-enabled telemedicine application and verified that I am speaking with the correct person using two identifiers.  Location: Patient: Virtual Visit Location  Patient: Home Provider: Virtual Visit Location Provider: Home Office   I discussed the limitations of evaluation and management by telemedicine and the availability of in person appointments. The patient expressed understanding and agreed to proceed.    History of Present Illness: [name] is a 48 y.o. who identifies as a female who was assigned female at birth, and is being seen today for mild flare of her chronic neck pain after moving the wrong way. In addition to her chronic pain, managed by pain specialist, noting increased tension and tightness at back of neck causing mild headache. Has been trying to rest and taking her regular medications as directed. Symptoms improved but not fully resolved. Needs note to return to work tomorrow.   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 (Patient not taking: Reported on 10/06/2023), Disp: , Rfl:    clonazePAM  (KLONOPIN ) 1 MG tablet, Take 1 mg by mouth daily., Disp: , Rfl:    gabapentin (NEURONTIN) 100 MG capsule, Take 100 mg by mouth 3 (three) times daily as needed.,  Disp: , Rfl:    ondansetron  (ZOFRAN -ODT) 4 MG disintegrating tablet, Take 1 tablet (4 mg total) by mouth every 8 (eight) hours as needed for nausea or vomiting., Disp: 20 tablet, Rfl: 0   Oxycodone  HCl 20 MG TABS, Take 1 tablet by mouth 4 (four) times daily as needed., Disp: , Rfl:    tiZANidine  (ZANAFLEX ) 4 MG tablet, Take 1 tablet (4 mg total) by mouth 2 (two) times daily., Disp: 15 tablet, Rfl: 0  Observations/Objective: Patient is well-developed, well-nourished in no acute distress.  Resting comfortably  at home.  Head is normocephalic, atraumatic.  No labored breathing.  Speech is clear and coherent with logical content.  Patient is alert and oriented at baseline.   Assessment and Plan: 1. Neck pain (Primary)  Acute on chronic. No alarm signs or symptoms present. Will refill her Tizanidine  to use as directed, when home.not working, to relax the muscles. Continue routine medications from her specialist. Ok to return to work tomorrow as long as symptoms continue to resolve.   Follow Up Instructions: I discussed the assessment and treatment plan with the patient. The patient was provided an opportunity to ask questions and all were answered. The patient agreed with the plan and demonstrated an understanding of the instructions.  A copy of instructions were sent to the patient via MyChart unless otherwise noted below.   The patient was advised to call back or seek an in-person evaluation if the symptoms worsen or if the condition fails to improve as anticipated.    Betty Velma Lunger, PA-C

## 2024-08-03 NOTE — Patient Instructions (Signed)
 [name], thank you for joining [name], PA-C for today's virtual visit.  While this provider is not your primary care provider (PCP), if your PCP is located in our provider database this encounter information will be shared with them immediately following your visit.   A [facility] MyChart account gives you access to today's visit and all your visits, tests, and labs performed at [facility]  click here if you don't have a [facility] MyChart account or go to [email or web address]  Consent: (Patient) [name] provided verbal consent for this virtual visit at the beginning of the encounter.  Current Medications:  Current Outpatient Medications:    acetaminophen  (TYLENOL ) 500 MG tablet, Take 500 mg by mouth every 6 (six) hours as needed for mild pain, moderate pain or fever. (Patient not taking: Reported on 10/06/2023), Disp: , Rfl:    albuterol  (PROVENTIL  HFA;VENTOLIN  HFA) 108 (90 BASE) MCG/ACT inhaler, Inhale 2 puffs into the lungs every 6 (six) hours as needed for wheezing or shortness of breath. (Patient not taking: Reported on 10/06/2023), Disp: , Rfl:    clonazePAM  (KLONOPIN ) 1 MG tablet, Take 1 mg by mouth daily., Disp: , Rfl:    gabapentin (NEURONTIN) 100 MG capsule, Take 100 mg by mouth 3 (three) times daily as needed., Disp: , Rfl:    naproxen  (NAPROSYN ) 500 MG tablet, Take 1 tablet (500 mg total) by mouth 2 (two) times daily with a meal., Disp: 20 tablet, Rfl: 0   ondansetron  (ZOFRAN -ODT) 4 MG disintegrating tablet, Take 1 tablet (4 mg total) by mouth every 8 (eight) hours as needed for nausea or vomiting., Disp: 20 tablet, Rfl: 0   Oxycodone  HCl 10 MG TABS, Take 20 mg by mouth 5 (five) times daily. , Disp: , Rfl:    Oxycodone  HCl 20 MG TABS, Take 1 tablet by mouth 4 (four) times daily as needed., Disp: , Rfl:    tiZANidine  (ZANAFLEX ) 4 MG tablet, Take 4 mg by mouth 2 (two) times daily., Disp: , Rfl:    Medications ordered in this encounter:  No  orders of the defined types were placed in this encounter.    *If you need refills on other medications prior to your next appointment, please contact your pharmacy*  Follow-Up: Call back or seek an in-person evaluation if the symptoms worsen or if the condition fails to improve as anticipated.  [facility] [phone]  Other Instructions Please continue your routine medications. I have sent in a refill of the Tizanidine  to use as directed -- no driving or operating machinery while taking this medication. Ok to return to work tomorrow as long as symptoms continue to improve. If you note any non-resolving, new, or worsening symptoms despite treatment, please seek an in-person evaluation ASAP.    If you have been instructed to have an in-person evaluation today at a local Urgent Care facility, please use the link below. It will take you to a list of all of our available [facility], including address, phone number and hours of operation. Please do not delay care.  [facility]  If you or a family member do not have a primary care provider, use the link below to schedule a visit and establish care. When you choose a [facility] primary care physician or advanced practice provider, you gain a long-term partner in health. Find a Primary Care Provider  Learn more about [facility]'s in-office and virtual care options: [facility] -  Get Care Now

## 2024-08-05 DIAGNOSIS — Z79899 Other long term (current) drug therapy: Secondary | ICD-10-CM | POA: Diagnosis not present

## 2024-08-26 DIAGNOSIS — Z1211 Encounter for screening for malignant neoplasm of colon: Secondary | ICD-10-CM | POA: Diagnosis not present

## 2024-08-26 DIAGNOSIS — R630 Anorexia: Secondary | ICD-10-CM | POA: Diagnosis not present

## 2024-08-30 DIAGNOSIS — M961 Postlaminectomy syndrome, not elsewhere classified: Secondary | ICD-10-CM | POA: Diagnosis not present

## 2024-08-30 DIAGNOSIS — Z79899 Other long term (current) drug therapy: Secondary | ICD-10-CM | POA: Diagnosis not present

## 2024-08-30 DIAGNOSIS — Z6822 Body mass index (BMI) 22.0-22.9, adult: Secondary | ICD-10-CM | POA: Diagnosis not present

## 2024-08-30 DIAGNOSIS — F17210 Nicotine dependence, cigarettes, uncomplicated: Secondary | ICD-10-CM | POA: Diagnosis not present

## 2024-09-07 ENCOUNTER — Other Ambulatory Visit (HOSPITAL_COMMUNITY): Payer: Self-pay | Admitting: Internal Medicine

## 2024-09-07 DIAGNOSIS — R221 Localized swelling, mass and lump, neck: Secondary | ICD-10-CM

## 2024-09-07 DIAGNOSIS — L723 Sebaceous cyst: Secondary | ICD-10-CM | POA: Diagnosis not present

## 2024-09-07 DIAGNOSIS — F411 Generalized anxiety disorder: Secondary | ICD-10-CM | POA: Diagnosis not present

## 2024-09-07 DIAGNOSIS — J069 Acute upper respiratory infection, unspecified: Secondary | ICD-10-CM | POA: Diagnosis not present

## 2024-09-07 DIAGNOSIS — R634 Abnormal weight loss: Secondary | ICD-10-CM | POA: Diagnosis not present

## 2024-09-26 ENCOUNTER — Ambulatory Visit (HOSPITAL_COMMUNITY)
Admission: RE | Admit: 2024-09-26 | Discharge: 2024-09-26 | Disposition: A | Discharge status: Home / Self Care | Source: Ambulatory Visit | Attending: Internal Medicine | Admitting: Internal Medicine

## 2024-09-26 DIAGNOSIS — R221 Localized swelling, mass and lump, neck: Secondary | ICD-10-CM | POA: Diagnosis present
